# Patient Record
Sex: Female | Born: 1940
Health system: Southern US, Community
[De-identification: ages and names within clinical notes are randomized; demographics above are authoritative.]

## PROBLEM LIST (undated history)

## (undated) DIAGNOSIS — R112 Nausea with vomiting, unspecified: Secondary | ICD-10-CM

## (undated) DIAGNOSIS — Z9889 Other specified postprocedural states: Secondary | ICD-10-CM

## (undated) DIAGNOSIS — K219 Gastro-esophageal reflux disease without esophagitis: Secondary | ICD-10-CM

## (undated) DIAGNOSIS — E049 Nontoxic goiter, unspecified: Secondary | ICD-10-CM

## (undated) DIAGNOSIS — Z8669 Personal history of other diseases of the nervous system and sense organs: Secondary | ICD-10-CM

## (undated) DIAGNOSIS — J45909 Unspecified asthma, uncomplicated: Secondary | ICD-10-CM

## (undated) DIAGNOSIS — Z5189 Encounter for other specified aftercare: Secondary | ICD-10-CM

## (undated) DIAGNOSIS — I4891 Unspecified atrial fibrillation: Secondary | ICD-10-CM

## (undated) DIAGNOSIS — T4145XA Adverse effect of unspecified anesthetic, initial encounter: Secondary | ICD-10-CM

## (undated) DIAGNOSIS — C9110 Chronic lymphocytic leukemia of B-cell type not having achieved remission: Secondary | ICD-10-CM

## (undated) DIAGNOSIS — M1990 Unspecified osteoarthritis, unspecified site: Secondary | ICD-10-CM

## (undated) DIAGNOSIS — R010 Benign and innocent cardiac murmurs: Secondary | ICD-10-CM

## (undated) DIAGNOSIS — T8859XA Other complications of anesthesia, initial encounter: Secondary | ICD-10-CM

## (undated) HISTORY — DX: Chronic lymphocytic leukemia of B-cell type not having achieved remission: C91.10

## (undated) HISTORY — DX: Unspecified osteoarthritis, unspecified site: M19.90

## (undated) HISTORY — DX: Nontoxic goiter, unspecified: E04.9

## (undated) HISTORY — PX: FOOT SURGERY: SHX648

## (undated) HISTORY — DX: Encounter for other specified aftercare: Z51.89

## (undated) HISTORY — PX: TONSILLECTOMY: SUR1361

## (undated) HISTORY — DX: Gastro-esophageal reflux disease without esophagitis: K21.9

## (undated) HISTORY — DX: Personal history of other diseases of the nervous system and sense organs: Z86.69

---

## 1998-10-11 ENCOUNTER — Other Ambulatory Visit: Admission: RE | Admit: 1998-10-11 | Discharge: 1998-10-11 | Payer: Self-pay | Admitting: Internal Medicine

## 1999-04-10 ENCOUNTER — Other Ambulatory Visit: Admission: RE | Admit: 1999-04-10 | Discharge: 1999-04-10 | Payer: Self-pay | Admitting: Orthopedic Surgery

## 1999-04-13 ENCOUNTER — Ambulatory Visit (HOSPITAL_COMMUNITY): Admission: RE | Admit: 1999-04-13 | Discharge: 1999-04-13 | Payer: Self-pay | Admitting: Obstetrics & Gynecology

## 1999-09-04 ENCOUNTER — Other Ambulatory Visit: Admission: RE | Admit: 1999-09-04 | Discharge: 1999-09-04 | Payer: Self-pay | Admitting: Oncology

## 2000-06-10 ENCOUNTER — Other Ambulatory Visit: Admission: RE | Admit: 2000-06-10 | Discharge: 2000-06-10 | Payer: Self-pay | Admitting: *Deleted

## 2001-08-21 ENCOUNTER — Other Ambulatory Visit: Admission: RE | Admit: 2001-08-21 | Discharge: 2001-08-21 | Payer: Self-pay | Admitting: *Deleted

## 2001-11-14 ENCOUNTER — Encounter: Admission: RE | Admit: 2001-11-14 | Discharge: 2001-11-14 | Payer: Self-pay | Admitting: *Deleted

## 2001-11-14 ENCOUNTER — Encounter: Payer: Self-pay | Admitting: *Deleted

## 2001-11-28 ENCOUNTER — Encounter: Payer: Self-pay | Admitting: General Surgery

## 2001-11-28 ENCOUNTER — Encounter: Admission: RE | Admit: 2001-11-28 | Discharge: 2001-11-28 | Payer: Self-pay | Admitting: General Surgery

## 2001-11-29 ENCOUNTER — Encounter (INDEPENDENT_AMBULATORY_CARE_PROVIDER_SITE_OTHER): Payer: Self-pay | Admitting: Specialist

## 2001-11-29 ENCOUNTER — Ambulatory Visit (HOSPITAL_BASED_OUTPATIENT_CLINIC_OR_DEPARTMENT_OTHER): Admission: RE | Admit: 2001-11-29 | Discharge: 2001-11-29 | Payer: Self-pay | Admitting: General Surgery

## 2002-01-04 ENCOUNTER — Encounter: Admission: RE | Admit: 2002-01-04 | Discharge: 2002-01-04 | Payer: Self-pay | Admitting: Oncology

## 2002-01-04 ENCOUNTER — Encounter (INDEPENDENT_AMBULATORY_CARE_PROVIDER_SITE_OTHER): Payer: Self-pay | Admitting: Specialist

## 2002-01-04 ENCOUNTER — Encounter: Payer: Self-pay | Admitting: Oncology

## 2002-01-04 ENCOUNTER — Ambulatory Visit (HOSPITAL_COMMUNITY): Admission: RE | Admit: 2002-01-04 | Discharge: 2002-01-04 | Payer: Self-pay | Admitting: Oncology

## 2003-04-24 ENCOUNTER — Encounter: Payer: Self-pay | Admitting: Oncology

## 2003-04-24 ENCOUNTER — Encounter: Admission: RE | Admit: 2003-04-24 | Discharge: 2003-04-24 | Payer: Self-pay | Admitting: Oncology

## 2004-01-22 ENCOUNTER — Encounter: Admission: RE | Admit: 2004-01-22 | Discharge: 2004-01-22 | Payer: Self-pay | Admitting: Family Medicine

## 2004-01-31 ENCOUNTER — Encounter: Admission: RE | Admit: 2004-01-31 | Discharge: 2004-01-31 | Payer: Self-pay | Admitting: Oncology

## 2004-05-22 ENCOUNTER — Ambulatory Visit (HOSPITAL_COMMUNITY): Admission: RE | Admit: 2004-05-22 | Discharge: 2004-05-22 | Payer: Self-pay | Admitting: Oncology

## 2004-11-03 ENCOUNTER — Ambulatory Visit: Payer: Self-pay | Admitting: Oncology

## 2004-11-12 ENCOUNTER — Other Ambulatory Visit: Admission: RE | Admit: 2004-11-12 | Discharge: 2004-11-12 | Payer: Self-pay | Admitting: Family Medicine

## 2005-02-02 ENCOUNTER — Ambulatory Visit: Payer: Self-pay | Admitting: Oncology

## 2005-03-29 ENCOUNTER — Ambulatory Visit: Payer: Self-pay | Admitting: Oncology

## 2005-06-28 ENCOUNTER — Ambulatory Visit: Payer: Self-pay | Admitting: Oncology

## 2005-09-28 ENCOUNTER — Ambulatory Visit: Payer: Self-pay | Admitting: Oncology

## 2005-11-15 ENCOUNTER — Other Ambulatory Visit: Admission: RE | Admit: 2005-11-15 | Discharge: 2005-11-15 | Payer: Self-pay | Admitting: Family Medicine

## 2006-01-25 ENCOUNTER — Ambulatory Visit: Payer: Self-pay | Admitting: Oncology

## 2006-05-19 ENCOUNTER — Ambulatory Visit: Payer: Self-pay | Admitting: Oncology

## 2006-05-25 LAB — COMPREHENSIVE METABOLIC PANEL WITH GFR
ALT: 18 U/L (ref 0–40)
AST: 41 U/L — ABNORMAL HIGH (ref 0–37)
Albumin: 4.3 g/dL (ref 3.5–5.2)
Alkaline Phosphatase: 64 U/L (ref 39–117)
BUN: 10 mg/dL (ref 6–23)
CO2: 26 meq/L (ref 19–32)
Calcium: 9.4 mg/dL (ref 8.4–10.5)
Chloride: 104 meq/L (ref 96–112)
Creatinine, Ser: 0.9 mg/dL (ref 0.4–1.2)
Glucose, Bld: 89 mg/dL (ref 70–99)
Potassium: 4.1 meq/L (ref 3.5–5.3)
Sodium: 139 meq/L (ref 135–145)
Total Bilirubin: 0.6 mg/dL (ref 0.3–1.2)
Total Protein: 6.5 g/dL (ref 6.0–8.3)

## 2006-05-25 LAB — CBC WITH DIFFERENTIAL/PLATELET
BASO%: 2 % (ref 0.0–2.0)
Basophils Absolute: 0 10*3/uL (ref 0.0–0.1)
EOS%: 4.1 % (ref 0.0–7.0)
HGB: 14.2 g/dL (ref 11.6–15.9)
MCH: 31.5 pg (ref 26.0–34.0)
RDW: 14.3 % (ref 11.3–14.5)
WBC: 2.3 10*3/uL — ABNORMAL LOW (ref 3.9–10.0)
lymph#: 0.9 10*3/uL (ref 0.9–3.3)

## 2006-05-25 LAB — LACTATE DEHYDROGENASE: LDH: 223 U/L (ref 94–250)

## 2006-09-19 ENCOUNTER — Ambulatory Visit: Payer: Self-pay | Admitting: Oncology

## 2006-09-21 LAB — CBC WITH DIFFERENTIAL/PLATELET
Basophils Absolute: 0 10*3/uL (ref 0.0–0.1)
Eosinophils Absolute: 0.1 10*3/uL (ref 0.0–0.5)
HCT: 40.7 % (ref 34.8–46.6)
HGB: 13.8 g/dL (ref 11.6–15.9)
LYMPH%: 46 % (ref 14.0–48.0)
MCV: 93.4 fL (ref 81.0–101.0)
MONO#: 0.3 10*3/uL (ref 0.1–0.9)
MONO%: 10.4 % (ref 0.0–13.0)
NEUT#: 1 10*3/uL — ABNORMAL LOW (ref 1.5–6.5)
Platelets: 191 10*3/uL (ref 145–400)
RBC: 4.35 10*6/uL (ref 3.70–5.32)
WBC: 2.6 10*3/uL — ABNORMAL LOW (ref 3.9–10.0)

## 2006-09-21 LAB — LACTATE DEHYDROGENASE: LDH: 196 U/L (ref 94–250)

## 2006-09-21 LAB — COMPREHENSIVE METABOLIC PANEL
ALT: 16 U/L (ref 0–40)
BUN: 9 mg/dL (ref 6–23)
CO2: 27 mEq/L (ref 19–32)
Calcium: 9.7 mg/dL (ref 8.4–10.5)
Chloride: 106 mEq/L (ref 96–112)
Creatinine, Ser: 0.83 mg/dL (ref 0.40–1.20)
Glucose, Bld: 87 mg/dL (ref 70–99)

## 2006-09-21 LAB — MORPHOLOGY: RBC Comments: NORMAL

## 2006-10-31 ENCOUNTER — Ambulatory Visit (HOSPITAL_COMMUNITY): Admission: RE | Admit: 2006-10-31 | Discharge: 2006-10-31 | Payer: Self-pay | Admitting: Oncology

## 2006-11-07 ENCOUNTER — Ambulatory Visit: Payer: Self-pay | Admitting: Oncology

## 2006-11-09 LAB — CBC WITH DIFFERENTIAL/PLATELET
Basophils Absolute: 0.1 10*3/uL (ref 0.0–0.1)
EOS%: 3.5 % (ref 0.0–7.0)
Eosinophils Absolute: 0.1 10*3/uL (ref 0.0–0.5)
HCT: 41.1 % (ref 34.8–46.6)
HGB: 13.7 g/dL (ref 11.6–15.9)
MCH: 31.2 pg (ref 26.0–34.0)
MONO#: 0.5 10*3/uL (ref 0.1–0.9)
NEUT#: 1.1 10*3/uL — ABNORMAL LOW (ref 1.5–6.5)
NEUT%: 38.7 % — ABNORMAL LOW (ref 39.6–76.8)
RDW: 12.7 % (ref 11.3–14.5)
WBC: 3 10*3/uL — ABNORMAL LOW (ref 3.9–10.0)
lymph#: 1.2 10*3/uL (ref 0.9–3.3)

## 2006-12-28 ENCOUNTER — Ambulatory Visit: Payer: Self-pay | Admitting: Oncology

## 2007-01-02 LAB — CBC WITH DIFFERENTIAL/PLATELET
EOS%: 4.7 % (ref 0.0–7.0)
MCH: 31.2 pg (ref 26.0–34.0)
MCV: 91.5 fL (ref 81.0–101.0)
MONO%: 14.7 % — ABNORMAL HIGH (ref 0.0–13.0)
RBC: 4.56 10*6/uL (ref 3.70–5.32)
RDW: 12.6 % (ref 11.3–14.5)

## 2007-01-27 ENCOUNTER — Encounter: Admission: RE | Admit: 2007-01-27 | Discharge: 2007-01-27 | Payer: Self-pay | Admitting: Family Medicine

## 2007-03-06 ENCOUNTER — Ambulatory Visit: Payer: Self-pay | Admitting: Oncology

## 2007-03-08 LAB — CBC WITH DIFFERENTIAL/PLATELET
Basophils Absolute: 0 10*3/uL (ref 0.0–0.1)
Eosinophils Absolute: 0.1 10*3/uL (ref 0.0–0.5)
HCT: 42.2 % (ref 34.8–46.6)
HGB: 14.4 g/dL (ref 11.6–15.9)
LYMPH%: 50.4 % — ABNORMAL HIGH (ref 14.0–48.0)
MCV: 91.5 fL (ref 81.0–101.0)
MONO#: 0.3 10*3/uL (ref 0.1–0.9)
MONO%: 13.4 % — ABNORMAL HIGH (ref 0.0–13.0)
NEUT#: 0.8 10*3/uL — ABNORMAL LOW (ref 1.5–6.5)
Platelets: 174 10*3/uL (ref 145–400)

## 2007-03-08 LAB — COMPREHENSIVE METABOLIC PANEL
Albumin: 4.2 g/dL (ref 3.5–5.2)
Alkaline Phosphatase: 72 U/L (ref 39–117)
BUN: 13 mg/dL (ref 6–23)
CO2: 28 mEq/L (ref 19–32)
Glucose, Bld: 87 mg/dL (ref 70–99)
Total Bilirubin: 0.6 mg/dL (ref 0.3–1.2)

## 2007-03-08 LAB — LACTATE DEHYDROGENASE: LDH: 212 U/L (ref 94–250)

## 2007-03-29 LAB — CBC WITH DIFFERENTIAL/PLATELET
BASO%: 2.8 % — ABNORMAL HIGH (ref 0.0–2.0)
Eosinophils Absolute: 0.1 10*3/uL (ref 0.0–0.5)
HCT: 41 % (ref 34.8–46.6)
LYMPH%: 41.3 % (ref 14.0–48.0)
MONO#: 0.4 10*3/uL (ref 0.1–0.9)
NEUT#: 1.2 10*3/uL — ABNORMAL LOW (ref 1.5–6.5)
Platelets: 206 10*3/uL (ref 145–400)
RBC: 4.48 10*6/uL (ref 3.70–5.32)
WBC: 3.2 10*3/uL — ABNORMAL LOW (ref 3.9–10.0)
lymph#: 1.3 10*3/uL (ref 0.9–3.3)

## 2007-06-01 ENCOUNTER — Ambulatory Visit: Payer: Self-pay | Admitting: Oncology

## 2007-06-05 LAB — LACTATE DEHYDROGENASE: LDH: 190 U/L (ref 94–250)

## 2007-06-05 LAB — CBC WITH DIFFERENTIAL/PLATELET
Basophils Absolute: 0 10*3/uL (ref 0.0–0.1)
Eosinophils Absolute: 0.1 10*3/uL (ref 0.0–0.5)
HCT: 39.5 % (ref 34.8–46.6)
HGB: 13.7 g/dL (ref 11.6–15.9)
LYMPH%: 40.5 % (ref 14.0–48.0)
MCHC: 34.7 g/dL (ref 32.0–36.0)
MONO#: 0.3 10*3/uL (ref 0.1–0.9)
NEUT%: 40.1 % (ref 39.6–76.8)
Platelets: 166 10*3/uL (ref 145–400)
WBC: 2.1 10*3/uL — ABNORMAL LOW (ref 3.9–10.0)
lymph#: 0.9 10*3/uL (ref 0.9–3.3)

## 2007-06-05 LAB — COMPREHENSIVE METABOLIC PANEL
ALT: 19 U/L (ref 0–35)
AST: 34 U/L (ref 0–37)
CO2: 26 mEq/L (ref 19–32)
Chloride: 108 mEq/L (ref 96–112)
Creatinine, Ser: 0.85 mg/dL (ref 0.40–1.20)
Sodium: 144 mEq/L (ref 135–145)
Total Bilirubin: 0.6 mg/dL (ref 0.3–1.2)
Total Protein: 6.3 g/dL (ref 6.0–8.3)

## 2007-07-12 ENCOUNTER — Ambulatory Visit: Payer: Self-pay | Admitting: Oncology

## 2007-07-12 LAB — CBC WITH DIFFERENTIAL/PLATELET
BASO%: 3.6 % — ABNORMAL HIGH (ref 0.0–2.0)
Basophils Absolute: 0.1 10*3/uL (ref 0.0–0.1)
EOS%: 4 % (ref 0.0–7.0)
HCT: 43.4 % (ref 34.8–46.6)
HGB: 14.8 g/dL (ref 11.6–15.9)
LYMPH%: 45.2 % (ref 14.0–48.0)
MCH: 31.6 pg (ref 26.0–34.0)
MCHC: 34 g/dL (ref 32.0–36.0)
MCV: 93 fL (ref 81.0–101.0)
MONO%: 15.6 % — ABNORMAL HIGH (ref 0.0–13.0)
NEUT%: 31.6 % — ABNORMAL LOW (ref 39.6–76.8)
lymph#: 1.2 10*3/uL (ref 0.9–3.3)

## 2007-10-05 ENCOUNTER — Ambulatory Visit: Payer: Self-pay | Admitting: Oncology

## 2007-10-09 LAB — COMPREHENSIVE METABOLIC PANEL
ALT: 19 U/L (ref 0–35)
AST: 37 U/L (ref 0–37)
BUN: 11 mg/dL (ref 6–23)
Calcium: 9.7 mg/dL (ref 8.4–10.5)
Chloride: 104 mEq/L (ref 96–112)
Creatinine, Ser: 0.88 mg/dL (ref 0.40–1.20)
Total Bilirubin: 0.6 mg/dL (ref 0.3–1.2)

## 2007-10-09 LAB — CBC WITH DIFFERENTIAL/PLATELET
BASO%: 2.8 % — ABNORMAL HIGH (ref 0.0–2.0)
Basophils Absolute: 0.1 10*3/uL (ref 0.0–0.1)
EOS%: 5.2 % (ref 0.0–7.0)
HGB: 14 g/dL (ref 11.6–15.9)
MCH: 32.1 pg (ref 26.0–34.0)
MCHC: 34.9 g/dL (ref 32.0–36.0)
MCV: 92 fL (ref 81.0–101.0)
MONO%: 16.3 % — ABNORMAL HIGH (ref 0.0–13.0)
RBC: 4.36 10*6/uL (ref 3.70–5.32)
RDW: 11.9 % (ref 11.3–14.5)
lymph#: 1 10*3/uL (ref 0.9–3.3)

## 2007-10-09 LAB — MORPHOLOGY: PLT EST: ADEQUATE

## 2008-01-10 ENCOUNTER — Ambulatory Visit: Payer: Self-pay | Admitting: Oncology

## 2008-01-12 LAB — CBC WITH DIFFERENTIAL/PLATELET
BASO%: 1.1 % (ref 0.0–2.0)
LYMPH%: 39 % (ref 14.0–48.0)
MCHC: 34.1 g/dL (ref 32.0–36.0)
MCV: 91.6 fL (ref 81.0–101.0)
MONO#: 0.4 10*3/uL (ref 0.1–0.9)
MONO%: 13 % (ref 0.0–13.0)
Platelets: 191 10*3/uL (ref 145–400)
RBC: 4.53 10*6/uL (ref 3.70–5.32)
RDW: 14.4 % (ref 11.3–14.5)
WBC: 3.1 10*3/uL — ABNORMAL LOW (ref 3.9–10.0)

## 2008-01-12 LAB — COMPREHENSIVE METABOLIC PANEL
ALT: 23 U/L (ref 0–35)
AST: 42 U/L — ABNORMAL HIGH (ref 0–37)
Alkaline Phosphatase: 70 U/L (ref 39–117)
Sodium: 142 mEq/L (ref 135–145)
Total Bilirubin: 0.6 mg/dL (ref 0.3–1.2)
Total Protein: 6.7 g/dL (ref 6.0–8.3)

## 2008-02-12 ENCOUNTER — Other Ambulatory Visit: Admission: RE | Admit: 2008-02-12 | Discharge: 2008-02-12 | Payer: Self-pay | Admitting: Family Medicine

## 2008-05-06 ENCOUNTER — Ambulatory Visit: Payer: Self-pay | Admitting: Oncology

## 2008-08-02 ENCOUNTER — Ambulatory Visit: Payer: Self-pay | Admitting: Oncology

## 2008-11-25 ENCOUNTER — Ambulatory Visit: Payer: Self-pay | Admitting: Oncology

## 2008-11-27 LAB — COMPREHENSIVE METABOLIC PANEL
AST: 38 U/L — ABNORMAL HIGH (ref 0–37)
Albumin: 4.4 g/dL (ref 3.5–5.2)
Alkaline Phosphatase: 77 U/L (ref 39–117)
Calcium: 9.8 mg/dL (ref 8.4–10.5)
Chloride: 105 mEq/L (ref 96–112)
Potassium: 4.1 mEq/L (ref 3.5–5.3)
Sodium: 142 mEq/L (ref 135–145)
Total Protein: 6.6 g/dL (ref 6.0–8.3)

## 2008-11-27 LAB — CBC WITH DIFFERENTIAL/PLATELET
EOS%: 3 % (ref 0.0–7.0)
Eosinophils Absolute: 0.1 10*3/uL (ref 0.0–0.5)
HGB: 14.5 g/dL (ref 11.6–15.9)
MCH: 31.6 pg (ref 26.0–34.0)
MCV: 93.8 fL (ref 81.0–101.0)
MONO%: 9.9 % (ref 0.0–13.0)
NEUT#: 1.3 10*3/uL — ABNORMAL LOW (ref 1.5–6.5)
RBC: 4.59 10*6/uL (ref 3.70–5.32)
RDW: 15.1 % — ABNORMAL HIGH (ref 11.3–14.5)
lymph#: 1.1 10*3/uL (ref 0.9–3.3)

## 2008-11-27 LAB — LACTATE DEHYDROGENASE: LDH: 209 U/L (ref 94–250)

## 2008-12-23 ENCOUNTER — Ambulatory Visit (HOSPITAL_COMMUNITY): Admission: RE | Admit: 2008-12-23 | Discharge: 2008-12-23 | Payer: Self-pay | Admitting: Oncology

## 2009-01-13 ENCOUNTER — Ambulatory Visit: Payer: Self-pay | Admitting: Oncology

## 2009-02-18 ENCOUNTER — Encounter: Admission: RE | Admit: 2009-02-18 | Discharge: 2009-02-18 | Payer: Self-pay | Admitting: Family Medicine

## 2009-05-12 ENCOUNTER — Ambulatory Visit: Payer: Self-pay | Admitting: Oncology

## 2009-05-14 LAB — CBC WITH DIFFERENTIAL/PLATELET
Basophils Absolute: 0 10*3/uL (ref 0.0–0.1)
EOS%: 5.2 % (ref 0.0–7.0)
Eosinophils Absolute: 0.2 10*3/uL (ref 0.0–0.5)
HCT: 41.4 % (ref 34.8–46.6)
HGB: 14 g/dL (ref 11.6–15.9)
LYMPH%: 40 % (ref 14.0–49.7)
MCH: 31.1 pg (ref 25.1–34.0)
MCV: 92.2 fL (ref 79.5–101.0)
MONO%: 15.3 % — ABNORMAL HIGH (ref 0.0–14.0)
NEUT#: 1.3 10*3/uL — ABNORMAL LOW (ref 1.5–6.5)
NEUT%: 38.4 % (ref 38.4–76.8)
Platelets: 186 10*3/uL (ref 145–400)
RDW: 14.9 % — ABNORMAL HIGH (ref 11.2–14.5)

## 2009-05-14 LAB — LACTATE DEHYDROGENASE: LDH: 215 U/L (ref 94–250)

## 2009-05-14 LAB — COMPREHENSIVE METABOLIC PANEL
AST: 34 U/L (ref 0–37)
Albumin: 4.1 g/dL (ref 3.5–5.2)
BUN: 17 mg/dL (ref 6–23)
CO2: 27 mEq/L (ref 19–32)
Calcium: 9.4 mg/dL (ref 8.4–10.5)
Chloride: 106 mEq/L (ref 96–112)
Creatinine, Ser: 0.91 mg/dL (ref 0.40–1.20)
Potassium: 3.9 mEq/L (ref 3.5–5.3)

## 2009-05-14 LAB — MORPHOLOGY

## 2009-05-28 ENCOUNTER — Ambulatory Visit (HOSPITAL_COMMUNITY): Admission: RE | Admit: 2009-05-28 | Discharge: 2009-05-28 | Payer: Self-pay | Admitting: Oncology

## 2009-06-02 ENCOUNTER — Ambulatory Visit: Admission: RE | Admit: 2009-06-02 | Discharge: 2009-07-29 | Payer: Self-pay | Admitting: Radiation Oncology

## 2009-07-10 ENCOUNTER — Ambulatory Visit: Payer: Self-pay | Admitting: Oncology

## 2009-07-14 LAB — CBC WITH DIFFERENTIAL/PLATELET
Eosinophils Absolute: 0.1 10*3/uL (ref 0.0–0.5)
HCT: 41.5 % (ref 34.8–46.6)
LYMPH%: 56.4 % — ABNORMAL HIGH (ref 14.0–49.7)
MONO#: 0.3 10*3/uL (ref 0.1–0.9)
NEUT#: 0.9 10*3/uL — ABNORMAL LOW (ref 1.5–6.5)
Platelets: 138 10*3/uL — ABNORMAL LOW (ref 145–400)
RBC: 4.58 10*6/uL (ref 3.70–5.45)
WBC: 3.1 10*3/uL — ABNORMAL LOW (ref 3.9–10.3)

## 2009-11-10 ENCOUNTER — Ambulatory Visit: Payer: Self-pay | Admitting: Oncology

## 2009-11-12 LAB — CBC WITH DIFFERENTIAL/PLATELET
BASO%: 0.9 % (ref 0.0–2.0)
EOS%: 3.2 % (ref 0.0–7.0)
LYMPH%: 46.4 % (ref 14.0–49.7)
MCHC: 33.4 g/dL (ref 31.5–36.0)
MCV: 95.5 fL (ref 79.5–101.0)
MONO%: 8.1 % (ref 0.0–14.0)
Platelets: 156 10*3/uL (ref 145–400)
RBC: 4.32 10*6/uL (ref 3.70–5.45)

## 2009-11-12 LAB — COMPREHENSIVE METABOLIC PANEL
Albumin: 4.2 g/dL (ref 3.5–5.2)
Alkaline Phosphatase: 67 U/L (ref 39–117)
BUN: 15 mg/dL (ref 6–23)
Creatinine, Ser: 0.95 mg/dL (ref 0.40–1.20)
Glucose, Bld: 93 mg/dL (ref 70–99)
Total Bilirubin: 0.4 mg/dL (ref 0.3–1.2)

## 2009-11-12 LAB — MORPHOLOGY: RBC Comments: NORMAL

## 2010-05-12 ENCOUNTER — Ambulatory Visit: Payer: Self-pay | Admitting: Oncology

## 2010-05-13 LAB — CBC WITH DIFFERENTIAL/PLATELET
Eosinophils Absolute: 0.1 10*3/uL (ref 0.0–0.5)
HCT: 42.9 % (ref 34.8–46.6)
LYMPH%: 49.5 % (ref 14.0–49.7)
MCV: 93 fL (ref 79.5–101.0)
MONO#: 0.3 10*3/uL (ref 0.1–0.9)
MONO%: 10.2 % (ref 0.0–14.0)
NEUT#: 1.1 10*3/uL — ABNORMAL LOW (ref 1.5–6.5)
NEUT%: 36.1 % — ABNORMAL LOW (ref 38.4–76.8)
Platelets: 150 10*3/uL (ref 145–400)
RBC: 4.61 10*6/uL (ref 3.70–5.45)

## 2010-05-13 LAB — MORPHOLOGY: RBC Comments: NORMAL

## 2010-11-11 ENCOUNTER — Ambulatory Visit: Payer: Self-pay | Admitting: Oncology

## 2010-11-13 LAB — CBC WITH DIFFERENTIAL/PLATELET
Basophils Absolute: 0 10*3/uL (ref 0.0–0.1)
Eosinophils Absolute: 0.1 10*3/uL (ref 0.0–0.5)
HCT: 39.8 % (ref 34.8–46.6)
HGB: 13.7 g/dL (ref 11.6–15.9)
LYMPH%: 57 % — ABNORMAL HIGH (ref 14.0–49.7)
MCV: 93 fL (ref 79.5–101.0)
MONO#: 0.3 10*3/uL (ref 0.1–0.9)
MONO%: 10.7 % (ref 0.0–14.0)
NEUT#: 0.9 10*3/uL — ABNORMAL LOW (ref 1.5–6.5)
NEUT%: 28.7 % — ABNORMAL LOW (ref 38.4–76.8)
Platelets: 201 10*3/uL (ref 145–400)
RBC: 4.28 10*6/uL (ref 3.70–5.45)
WBC: 3 10*3/uL — ABNORMAL LOW (ref 3.9–10.3)

## 2010-11-13 LAB — COMPREHENSIVE METABOLIC PANEL
Alkaline Phosphatase: 77 U/L (ref 39–117)
BUN: 13 mg/dL (ref 6–23)
CO2: 31 mEq/L (ref 19–32)
Creatinine, Ser: 0.87 mg/dL (ref 0.40–1.20)
Glucose, Bld: 93 mg/dL (ref 70–99)
Sodium: 140 mEq/L (ref 135–145)
Total Bilirubin: 0.5 mg/dL (ref 0.3–1.2)
Total Protein: 6.5 g/dL (ref 6.0–8.3)

## 2010-11-13 LAB — LACTATE DEHYDROGENASE: LDH: 194 U/L (ref 94–250)

## 2011-05-14 ENCOUNTER — Encounter (HOSPITAL_BASED_OUTPATIENT_CLINIC_OR_DEPARTMENT_OTHER): Payer: Medicare Other | Admitting: Oncology

## 2011-05-14 ENCOUNTER — Other Ambulatory Visit: Payer: Self-pay | Admitting: Oncology

## 2011-05-14 DIAGNOSIS — C9110 Chronic lymphocytic leukemia of B-cell type not having achieved remission: Secondary | ICD-10-CM

## 2011-05-14 LAB — CBC WITH DIFFERENTIAL/PLATELET
BASO%: 1.2 % (ref 0.0–2.0)
Eosinophils Absolute: 0.1 10*3/uL (ref 0.0–0.5)
HCT: 41.2 % (ref 34.8–46.6)
MCHC: 33.5 g/dL (ref 31.5–36.0)
MONO#: 0.3 10*3/uL (ref 0.1–0.9)
NEUT#: 0.9 10*3/uL — ABNORMAL LOW (ref 1.5–6.5)
NEUT%: 26.8 % — ABNORMAL LOW (ref 38.4–76.8)
Platelets: 167 10*3/uL (ref 145–400)
WBC: 3.4 10*3/uL — ABNORMAL LOW (ref 3.9–10.3)
lymph#: 2 10*3/uL (ref 0.9–3.3)

## 2011-05-14 NOTE — Op Note (Signed)
[place]  Patient:    Brenda Terrell, Brenda Terrell Visit Number: [id number] MRN: [id number]          Service Type: DSU Location: [facility] Attending Physician:  Caleen Essex Dictated by:   Ollen Gross. Vernell Morgans, M.D. Proc. Date: 11/29/01 Admit Date:  11/29/2001 Discharge Date: 11/29/2001                             Operative Report  PREOPERATIVE DIAGNOSIS:  Left neck lymphadenopathy.  POSTOPERATIVE DIAGNOSIS:  Left neck lymphadenopathy.  PROCEDURE:  Excisional biopsy of left neck lymph node.  SURGEON:  Ollen Gross. Vernell Morgans, M.D.  ANESTHESIA:  General via LMA.  DESCRIPTION OF PROCEDURE:  After informed consent was obtained, the patient was brought to the operating room and placed in a supine position on the operating table.  After adequate induction of general anesthesia, the patients left neck was prepped with Betadine and draped in the usual sterile manner.  A small longitudinal incision was made over the most superficial palpable mobile lymph node.  This incision was carried down through the skin and subcutaneous tissue using the Bovie electrocautery.  Blunt dissection was then carried out to separate the lymph node from the rest of the surrounding tissues.  Once the lymph node was elevated, the lymphatic tracts leading into the lymph node inferiorly were clamped serially with hemostats and the lymph node was removed with the Metzenbaum scissors.  Each of these tracts that were clamped with hemostats were then ligated with 3-0 Vicryl ties.  The wound was then examined and found to be hemostatic.  The incision was then closed with a running 4-0 Monocryl subcuticular stitch.  Benzoin and Steri-Strips and a sterile dressing were applied.  The patient tolerated the procedure well.  At the end of the case, all needle, sponge, and instrument counts were correct. The patient was awakened and taken to the recovery room in stable condition. Dictated by:   Ollen Gross. Vernell Morgans, M.D. Attending Physician:  Caleen Essex DD:  11/30/01 TD:  11/30/01 Job: [id number]

## 2011-05-17 LAB — COMPREHENSIVE METABOLIC PANEL
Albumin: 4.1 g/dL (ref 3.5–5.2)
Alkaline Phosphatase: 62 U/L (ref 39–117)
BUN: 19 mg/dL (ref 6–23)
CO2: 24 mEq/L (ref 19–32)
Glucose, Bld: 91 mg/dL (ref 70–99)
Potassium: 3.8 mEq/L (ref 3.5–5.3)
Total Bilirubin: 0.6 mg/dL (ref 0.3–1.2)

## 2011-05-17 LAB — ANA: Anti Nuclear Antibody(ANA): NEGATIVE

## 2011-05-17 LAB — RHEUMATOID FACTOR: Rhuematoid fact SerPl-aCnc: <10 IU/mL (ref <=14)

## 2011-05-17 LAB — LACTATE DEHYDROGENASE: LDH: 198 U/L (ref 94–250)

## 2011-10-27 ENCOUNTER — Encounter (HOSPITAL_BASED_OUTPATIENT_CLINIC_OR_DEPARTMENT_OTHER): Payer: Medicare Other | Admitting: Oncology

## 2011-10-27 ENCOUNTER — Other Ambulatory Visit: Payer: Self-pay | Admitting: Oncology

## 2011-10-27 DIAGNOSIS — B029 Zoster without complications: Secondary | ICD-10-CM

## 2011-10-27 DIAGNOSIS — C9110 Chronic lymphocytic leukemia of B-cell type not having achieved remission: Secondary | ICD-10-CM

## 2011-10-27 LAB — COMPREHENSIVE METABOLIC PANEL
ALT: 24 U/L (ref 0–35)
Albumin: 4.2 g/dL (ref 3.5–5.2)
CO2: 24 mEq/L (ref 19–32)
Glucose, Bld: 98 mg/dL (ref 70–99)
Potassium: 4.1 mEq/L (ref 3.5–5.3)
Sodium: 141 mEq/L (ref 135–145)
Total Protein: 6.2 g/dL (ref 6.0–8.3)

## 2011-10-27 LAB — CBC WITH DIFFERENTIAL/PLATELET
Eosinophils Absolute: 0.1 10*3/uL (ref 0.0–0.5)
MONO#: 0.3 10*3/uL (ref 0.1–0.9)
NEUT#: 1.1 10*3/uL — ABNORMAL LOW (ref 1.5–6.5)
Platelets: 149 10*3/uL (ref 145–400)
RBC: 4.33 10*6/uL (ref 3.70–5.45)
RDW: 15.3 % — ABNORMAL HIGH (ref 11.2–14.5)
WBC: 3.7 10*3/uL — ABNORMAL LOW (ref 3.9–10.3)
lymph#: 2.1 10*3/uL (ref 0.9–3.3)

## 2011-10-27 LAB — LACTATE DEHYDROGENASE: LDH: 203 U/L (ref 94–250)

## 2011-12-16 ENCOUNTER — Encounter: Payer: Self-pay | Admitting: Oncology

## 2012-02-18 DIAGNOSIS — Z23 Encounter for immunization: Secondary | ICD-10-CM | POA: Diagnosis not present

## 2012-02-18 DIAGNOSIS — J45909 Unspecified asthma, uncomplicated: Secondary | ICD-10-CM | POA: Diagnosis not present

## 2012-02-18 DIAGNOSIS — E039 Hypothyroidism, unspecified: Secondary | ICD-10-CM | POA: Diagnosis not present

## 2012-02-18 DIAGNOSIS — E7800 Pure hypercholesterolemia, unspecified: Secondary | ICD-10-CM | POA: Diagnosis not present

## 2012-05-24 DIAGNOSIS — H4011X0 Primary open-angle glaucoma, stage unspecified: Secondary | ICD-10-CM | POA: Diagnosis not present

## 2012-05-24 DIAGNOSIS — H43819 Vitreous degeneration, unspecified eye: Secondary | ICD-10-CM | POA: Diagnosis not present

## 2012-05-24 DIAGNOSIS — H04129 Dry eye syndrome of unspecified lacrimal gland: Secondary | ICD-10-CM | POA: Diagnosis not present

## 2012-06-27 DIAGNOSIS — H04129 Dry eye syndrome of unspecified lacrimal gland: Secondary | ICD-10-CM | POA: Diagnosis not present

## 2012-06-27 DIAGNOSIS — H4011X0 Primary open-angle glaucoma, stage unspecified: Secondary | ICD-10-CM | POA: Diagnosis not present

## 2012-06-27 DIAGNOSIS — H43819 Vitreous degeneration, unspecified eye: Secondary | ICD-10-CM | POA: Diagnosis not present

## 2012-07-05 ENCOUNTER — Ambulatory Visit (HOSPITAL_BASED_OUTPATIENT_CLINIC_OR_DEPARTMENT_OTHER): Payer: Medicare Other | Admitting: Oncology

## 2012-07-05 ENCOUNTER — Other Ambulatory Visit (HOSPITAL_BASED_OUTPATIENT_CLINIC_OR_DEPARTMENT_OTHER): Payer: Medicare Other | Admitting: Lab

## 2012-07-05 ENCOUNTER — Encounter: Payer: Self-pay | Admitting: Oncology

## 2012-07-05 VITALS — BP 142/78 | HR 77 | Temp 97.8°F | Ht 60.0 in | Wt 134.4 lb

## 2012-07-05 DIAGNOSIS — C9110 Chronic lymphocytic leukemia of B-cell type not having achieved remission: Secondary | ICD-10-CM

## 2012-07-05 DIAGNOSIS — C8599 Non-Hodgkin lymphoma, unspecified, extranodal and solid organ sites: Secondary | ICD-10-CM | POA: Diagnosis not present

## 2012-07-05 DIAGNOSIS — D72819 Decreased white blood cell count, unspecified: Secondary | ICD-10-CM | POA: Insufficient documentation

## 2012-07-05 DIAGNOSIS — D863 Sarcoidosis of skin: Secondary | ICD-10-CM

## 2012-07-05 DIAGNOSIS — C8300 Small cell B-cell lymphoma, unspecified site: Secondary | ICD-10-CM

## 2012-07-05 LAB — COMPREHENSIVE METABOLIC PANEL
ALT: 15 U/L (ref 0–35)
Albumin: 4 g/dL (ref 3.5–5.2)
CO2: 29 mEq/L (ref 19–32)
Calcium: 9.5 mg/dL (ref 8.4–10.5)
Chloride: 105 mEq/L (ref 96–112)
Sodium: 141 mEq/L (ref 135–145)
Total Protein: 6 g/dL (ref 6.0–8.3)

## 2012-07-05 LAB — LACTATE DEHYDROGENASE: LDH: 180 U/L (ref 94–250)

## 2012-07-05 LAB — CBC WITH DIFFERENTIAL/PLATELET
Basophils Absolute: 0.1 10*3/uL (ref 0.0–0.1)
HCT: 41.4 % (ref 34.8–46.6)
HGB: 13.9 g/dL (ref 11.6–15.9)
MCH: 31.7 pg (ref 25.1–34.0)
MONO#: 0.3 10*3/uL (ref 0.1–0.9)
NEUT%: 27 % — ABNORMAL LOW (ref 38.4–76.8)
Platelets: 153 10*3/uL (ref 145–400)
WBC: 3.7 10*3/uL — ABNORMAL LOW (ref 3.9–10.3)
lymph#: 2.2 10*3/uL (ref 0.9–3.3)

## 2012-07-05 LAB — MORPHOLOGY: PLT EST: ADEQUATE

## 2012-07-05 NOTE — Patient Instructions (Signed)
Prescription for Famvir, to begin promptly if symptoms of shingles; call MD if need to begin

## 2012-07-05 NOTE — Progress Notes (Signed)
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 She has not required treatment for the CLL since we tried Rituxan in 2005. She was seen several years ago by Dr Delene Ruffini at [facility].  Patient has had no infectious illnesses since she was here last in Oct 2012. She did have several months of muscle aches arms and thighs of unclear etiology, these recently resolved, which she thinks is related to a new herbal supplement. 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 She has residual scarring from zoster across left scapula and some discomfort there. Bowels and bladder unchanged. She has follow up of cholesterol with Dr Maurice Small in August. Last mammograms were at [facility] 12-16-11. Remainder of 10 point Review of Systems negative.  Objective:  Vital signs in last 24 hours:  BP 142/78  Pulse 77  Temp 97.8 F (36.6 C) (Oral)  Ht 5' (1.524 m)  Wt 134 lb 6.4 oz (60.963 kg)  BMI 26.25 kg/m2 Weight is stable. Easily ambulatory, looks comfortable.  HEENT:PERRLA, sclera clear, anicteric and oropharynx clear, no lesions LymphaticsCervical, supraclavicular, and axillary nodes normal. Resp: clear to auscultation bilaterally and normal percussion bilaterally Cardio: regular rate and rhythm GI: soft, non-tender; bowel sounds normal; no masses,  no organomegaly Extremities: extremities normal, atraumatic, no cyanosis or edema Neuro: nonfocal Breasts bilaterally without dominant mass, skin or nipple findings. Axillae benign Skin with discoloration in area of previous zoster left upper back.  Lab Results:  Results for orders placed in visit on 07/05/12  CBC WITH DIFFERENTIAL      Component Value Range   WBC 3.7 (*) 3.9 - 10.3  10e3/uL   NEUT# 1.0 (*) 1.5 - 6.5 10e3/uL   HGB 13.9  11.6 - 15.9 g/dL   HCT 52.8  41.3 - 24.4 %   Platelets 153  145 - 400 10e3/uL   MCV 94.7  79.5 - 101.0 fL   MCH 31.7  25.1 - 34.0 pg   MCHC 33.5  31.5 - 36.0 g/dL   RBC 0.10  2.72 - 5.36 10e6/uL   RDW 14.9 (*) 11.2 - 14.5 %   lymph# 2.2  0.9 - 3.3 10e3/uL   MONO# 0.3  0.1 - 0.9 10e3/uL   Eosinophils Absolute 0.2  0.0 - 0.5 10e3/uL   Basophils Absolute 0.1  0.0 - 0.1 10e3/uL   NEUT% 27.0 (*) 38.4 - 76.8 %   LYMPH% 58.5 (*) 14.0 - 49.7 %   MONO% 8.8  0.0 - 14.0 %   EOS% 4.2  0.0 - 7.0 %   BASO% 1.5  0.0 - 2.0 %  MORPHOLOGY      Component Value Range   RBC Comments Within Normal Limits  Within Normal Limits   White Cell Comments Variant Lymphs     PLT EST Adequate  Adequate    CMET available after visit normal, including normal AST. Studies/Results:  No results found.  Medications: I have reviewed the patient's current medications. I have given her a prescription for Famvir 500 mg tid to begin promptly if rash or symptoms suggest recurrent zoster; she is to call MD also if such symptoms.  Assessment/Plan: 1. 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 I will see her again ~  6 months from visit with PCP in August,  or sooner if needed 2.multiple environmental allergies 3.previous herpes zoster May 2012  Note patient refuses flu shots, tho husband takes these.    Brenda Terrell P, MD   07/05/2012, 12:58 PM

## 2012-07-06 ENCOUNTER — Telehealth: Payer: Self-pay | Admitting: Internal Medicine

## 2012-07-06 NOTE — Telephone Encounter (Signed)
Talked to pt and gave her appt for February 2014 lab and MD

## 2012-07-07 ENCOUNTER — Other Ambulatory Visit: Payer: Self-pay

## 2012-07-07 MED ORDER — FAMCICLOVIR 500 MG PO TABS: 500.0000 mg | ORAL_TABLET | Freq: Three times a day (TID) | ORAL | Status: DC

## 2012-08-17 DIAGNOSIS — J45909 Unspecified asthma, uncomplicated: Secondary | ICD-10-CM | POA: Diagnosis not present

## 2012-08-17 DIAGNOSIS — E039 Hypothyroidism, unspecified: Secondary | ICD-10-CM | POA: Diagnosis not present

## 2012-10-12 DIAGNOSIS — H2510 Age-related nuclear cataract, unspecified eye: Secondary | ICD-10-CM | POA: Diagnosis not present

## 2012-10-12 DIAGNOSIS — H43399 Other vitreous opacities, unspecified eye: Secondary | ICD-10-CM | POA: Diagnosis not present

## 2012-10-12 DIAGNOSIS — H4011X0 Primary open-angle glaucoma, stage unspecified: Secondary | ICD-10-CM | POA: Diagnosis not present

## 2012-11-15 DIAGNOSIS — Z1231 Encounter for screening mammogram for malignant neoplasm of breast: Secondary | ICD-10-CM | POA: Diagnosis not present

## 2012-12-13 DIAGNOSIS — H409 Unspecified glaucoma: Secondary | ICD-10-CM | POA: Diagnosis not present

## 2012-12-13 DIAGNOSIS — H04129 Dry eye syndrome of unspecified lacrimal gland: Secondary | ICD-10-CM | POA: Diagnosis not present

## 2012-12-13 DIAGNOSIS — H4011X0 Primary open-angle glaucoma, stage unspecified: Secondary | ICD-10-CM | POA: Diagnosis not present

## 2013-01-29 ENCOUNTER — Telehealth: Payer: Self-pay | Admitting: Oncology

## 2013-01-29 ENCOUNTER — Other Ambulatory Visit: Payer: Self-pay

## 2013-01-29 NOTE — Telephone Encounter (Signed)
s.w. pt and advised on 2.25.14 appt....pt ok and aware

## 2013-02-05 ENCOUNTER — Ambulatory Visit: Payer: Medicare Other | Admitting: Oncology

## 2013-02-05 ENCOUNTER — Other Ambulatory Visit: Payer: Medicare Other | Admitting: Lab

## 2013-02-06 ENCOUNTER — Other Ambulatory Visit: Payer: Medicare Other | Admitting: Lab

## 2013-02-06 ENCOUNTER — Ambulatory Visit: Payer: Medicare Other | Admitting: Oncology

## 2013-02-20 ENCOUNTER — Ambulatory Visit (HOSPITAL_BASED_OUTPATIENT_CLINIC_OR_DEPARTMENT_OTHER): Payer: Medicare Other | Admitting: Oncology

## 2013-02-20 ENCOUNTER — Telehealth: Payer: Self-pay | Admitting: Oncology

## 2013-02-20 ENCOUNTER — Encounter: Payer: Self-pay | Admitting: Oncology

## 2013-02-20 ENCOUNTER — Other Ambulatory Visit (HOSPITAL_BASED_OUTPATIENT_CLINIC_OR_DEPARTMENT_OTHER): Payer: Medicare Other | Admitting: Lab

## 2013-02-20 VITALS — BP 144/76 | HR 77 | Temp 97.0°F | Resp 18 | Ht 60.0 in | Wt 137.0 lb

## 2013-02-20 DIAGNOSIS — D708 Other neutropenia: Secondary | ICD-10-CM | POA: Diagnosis not present

## 2013-02-20 DIAGNOSIS — C8300 Small cell B-cell lymphoma, unspecified site: Secondary | ICD-10-CM

## 2013-02-20 DIAGNOSIS — C9110 Chronic lymphocytic leukemia of B-cell type not having achieved remission: Secondary | ICD-10-CM | POA: Diagnosis not present

## 2013-02-20 DIAGNOSIS — C8599 Non-Hodgkin lymphoma, unspecified, extranodal and solid organ sites: Secondary | ICD-10-CM | POA: Diagnosis not present

## 2013-02-20 LAB — CBC WITH DIFFERENTIAL/PLATELET
Basophils Absolute: 0.1 10*3/uL (ref 0.0–0.1)
Eosinophils Absolute: 0.2 10*3/uL (ref 0.0–0.5)
HCT: 40.4 % (ref 34.8–46.6)
HGB: 13.6 g/dL (ref 11.6–15.9)
MONO#: 0.4 10*3/uL (ref 0.1–0.9)
NEUT%: 27.4 % — ABNORMAL LOW (ref 38.4–76.8)
WBC: 4.8 10*3/uL (ref 3.9–10.3)
lymph#: 2.9 10*3/uL (ref 0.9–3.3)

## 2013-02-20 LAB — COMPREHENSIVE METABOLIC PANEL (CC13)
ALT: 19 U/L (ref 0–55)
BUN: 16.8 mg/dL (ref 7.0–26.0)
CO2: 27 mEq/L (ref 22–29)
Calcium: 9.3 mg/dL (ref 8.4–10.4)
Chloride: 107 mEq/L (ref 98–107)
Creatinine: 0.9 mg/dL (ref 0.6–1.1)

## 2013-02-20 LAB — LACTATE DEHYDROGENASE: LDH: 210 U/L (ref 94–250)

## 2013-02-20 LAB — MORPHOLOGY: PLT EST: ADEQUATE

## 2013-02-20 NOTE — Telephone Encounter (Signed)
gv and printed appt schedule for pt for March,,,gv pt Barium...pt aware cs. ...will call with d/t of ct

## 2013-02-20 NOTE — Progress Notes (Signed)
OFFICE PROGRESS NOTE   02/20/2013   Physicians: E.Griffin (D.Simonds), (D.Jacobs) (Gockerman)  INTERVAL HISTORY:   Patient is seen, together with husband, in follow up of her B cell CLL and chronic leukopenia, presently alternating visits every 6 months with her PCP Dr Valentina Lucks. Patient felt badly for ~ a month after Christmas, with joint aching and fatigue but no fever or respiratory symptoms, did not see MD, is back to baseline now.  Patient has chronic leukopenia since at least 1986,  preceding the B cell CLL diagnosis which was made in Dec 2002. CLL diagnosis was made by biopsy of left cervical lymph node and bone marrow exam, CD20 positive by flow cytometry. She was treated for the CLL with Rituxan in 2005/ 2006 and again Jan 2008 thru Feb 2009, held then due to complaints of transient short term memory loss after each RItuxan treatment. Last CT AP was 11-2008 and last CT chest and neck was 05-2009.  Patient has had no fever or clear symptoms of infection in past month. Energy and appetite are at baseline. She has no different pain or joint symptoms. Bowels are moving as usual, with careful diet. No skin rash. No bleeding. No respiratory symptoms.  Remainder of 10 point Review of Systems negative/ unchanged..  Objective:  Vital signs in last 24 hours:  BP 144/76  Pulse 77  Temp(Src) 97 F (36.1 C) (Oral)  Resp 18  Ht 5' (1.524 m)  Wt 137 lb (62.143 kg)  BMI 26.76 kg/m2  Weight is up 3 lbs. Easily ambulatory, NAD. Respirations not labored RA.  HEENT:PERRLA, sclera clear, anicteric and oropharynx clear, no lesions Dull erythema bilateral posterior pharynx consistent with some post nasal drainage. Normal hair pattern Lymphatics Soft cervical lymph nodes bilaterally up to 1-2 cm, not tender L>R, some left supraclavicular similar nodes, none felt in axillae now. Resp: clear to auscultation bilaterally and normal percussion bilaterally Cardio: regular rate and rhythm GI: soft,  non-tender; bowel sounds normal; no masses,  no organomegaly. Soft rounded mass ~ 6-7 cm below sternum in midline abdomen, ~ 4 cm diameter, not tender, does not reduce. Extremities: extremities normal, atraumatic, no cyanosis or edema Neuro:nonfocal Breasts bilaterally without dominant mass, skin or nipple findings. Skin without rash or ecchymosis, including face.  Lab Results:  Results for orders placed in visit on 02/20/13  MORPHOLOGY      Result Value Range   RBC Comments Within Normal Limits  Within Normal Limits   White Cell Comments C/W auto diff     Other Comments Few Variant Lymphs     PLT EST Adequate  Adequate  CBC WITH DIFFERENTIAL      Result Value Range   WBC 4.8  3.9 - 10.3 10e3/uL   NEUT# 1.3 (*) 1.5 - 6.5 10e3/uL   HGB 13.6  11.6 - 15.9 g/dL   HCT 16.1  09.6 - 04.5 %   Platelets 176  145 - 400 10e3/uL   MCV 93.3  79.5 - 101.0 fL   MCH 31.4  25.1 - 34.0 pg   MCHC 33.6  31.5 - 36.0 g/dL   RBC 4.09  8.11 - 9.14 10e6/uL   RDW 15.0 (*) 11.2 - 14.5 %   lymph# 2.9  0.9 - 3.3 10e3/uL   MONO# 0.4  0.1 - 0.9 10e3/uL   Eosinophils Absolute 0.2  0.0 - 0.5 10e3/uL   Basophils Absolute 0.1  0.0 - 0.1 10e3/uL   NEUT% 27.4 (*) 38.4 - 76.8 %   LYMPH% 59.1 (*)  14.0 - 49.7 %   MONO% 7.9  0.0 - 14.0 %   EOS% 4.5  0.0 - 7.0 %   BASO% 1.1  0.0 - 2.0 %   BLood work reviewed with patient now -- WBC/ ANC actually better today. Patient does not recall having testing for Lyme. No thyroid function tests in this EMR, tho PCP is not in this system. Studies/Results: Last CT 2009 images reviewed, nothing that I can tell corresponds to the soft swelling in upper midline abdomen.  She has just had mammograms at [facility]-19[facility], understands that these were not remarkable. Medications: I have reviewed the patient's current medications.  Assessment/Plan:  1. CLL: clinically looks stable now, tho unusual symptoms in Dec-Jan similar to what she recalls prior to Rituxan. New area of fullness  upper abdomen. Discussed repeating CTs, which she agrees to do. I will see her back after scans. 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 I think reasonable to check lyme titers with next blood work done.  Patient was comfortable with plan above.   TIme spent including discussion and coordination of care 25 min.      Trinidi Toppins P, MD   02/20/2013, 12:06 PM

## 2013-03-06 ENCOUNTER — Ambulatory Visit (HOSPITAL_COMMUNITY)
Admission: RE | Admit: 2013-03-06 | Discharge: 2013-03-06 | Disposition: A | Discharge status: Home / Self Care | Payer: Medicare Other | Source: Ambulatory Visit | Attending: Oncology | Admitting: Oncology

## 2013-03-06 ENCOUNTER — Encounter (HOSPITAL_COMMUNITY): Payer: Self-pay

## 2013-03-06 DIAGNOSIS — I517 Cardiomegaly: Secondary | ICD-10-CM | POA: Insufficient documentation

## 2013-03-06 DIAGNOSIS — I700 Atherosclerosis of aorta: Secondary | ICD-10-CM | POA: Diagnosis not present

## 2013-03-06 DIAGNOSIS — K5730 Diverticulosis of large intestine without perforation or abscess without bleeding: Secondary | ICD-10-CM | POA: Insufficient documentation

## 2013-03-06 DIAGNOSIS — R599 Enlarged lymph nodes, unspecified: Secondary | ICD-10-CM | POA: Diagnosis not present

## 2013-03-06 DIAGNOSIS — K439 Ventral hernia without obstruction or gangrene: Secondary | ICD-10-CM | POA: Diagnosis not present

## 2013-03-06 DIAGNOSIS — K7689 Other specified diseases of liver: Secondary | ICD-10-CM | POA: Insufficient documentation

## 2013-03-06 DIAGNOSIS — C9110 Chronic lymphocytic leukemia of B-cell type not having achieved remission: Secondary | ICD-10-CM | POA: Diagnosis not present

## 2013-03-06 DIAGNOSIS — K769 Liver disease, unspecified: Secondary | ICD-10-CM | POA: Diagnosis not present

## 2013-03-06 DIAGNOSIS — J984 Other disorders of lung: Secondary | ICD-10-CM | POA: Diagnosis not present

## 2013-03-06 HISTORY — DX: Unspecified asthma, uncomplicated: J45.909

## 2013-03-06 MED ORDER — IOHEXOL 300 MG/ML  SOLN
100.0000 mL | Freq: Once | INTRAMUSCULAR | Status: AC | PRN
  Administered 2013-03-06: 100 mL via INTRAVENOUS

## 2013-03-21 ENCOUNTER — Ambulatory Visit (HOSPITAL_BASED_OUTPATIENT_CLINIC_OR_DEPARTMENT_OTHER): Payer: Medicare Other | Admitting: Oncology

## 2013-03-21 ENCOUNTER — Encounter: Payer: Self-pay | Admitting: Oncology

## 2013-03-21 ENCOUNTER — Telehealth: Payer: Self-pay | Admitting: Oncology

## 2013-03-21 ENCOUNTER — Other Ambulatory Visit (HOSPITAL_BASED_OUTPATIENT_CLINIC_OR_DEPARTMENT_OTHER): Payer: Medicare Other | Admitting: Lab

## 2013-03-21 VITALS — BP 140/84 | HR 76 | Temp 96.9°F | Resp 18 | Ht 60.0 in | Wt 138.0 lb

## 2013-03-21 DIAGNOSIS — C9110 Chronic lymphocytic leukemia of B-cell type not having achieved remission: Secondary | ICD-10-CM | POA: Diagnosis not present

## 2013-03-21 DIAGNOSIS — D72819 Decreased white blood cell count, unspecified: Secondary | ICD-10-CM

## 2013-03-21 LAB — CBC WITH DIFFERENTIAL/PLATELET
EOS%: 3 % (ref 0.0–7.0)
MCH: 30.9 pg (ref 25.1–34.0)
MCHC: 32.9 g/dL (ref 31.5–36.0)
MCV: 93.9 fL (ref 79.5–101.0)
MONO%: 8.4 % (ref 0.0–14.0)
RBC: 4.48 10*6/uL (ref 3.70–5.45)
RDW: 15.9 % — ABNORMAL HIGH (ref 11.2–14.5)

## 2013-03-21 LAB — COMPREHENSIVE METABOLIC PANEL (CC13)
AST: 47 U/L — ABNORMAL HIGH (ref 5–34)
Albumin: 3.8 g/dL (ref 3.5–5.0)
Alkaline Phosphatase: 84 U/L (ref 40–150)
Potassium: 5.1 mEq/L (ref 3.5–5.1)
Sodium: 143 mEq/L (ref 136–145)
Total Bilirubin: 0.43 mg/dL (ref 0.20–1.20)
Total Protein: 6.7 g/dL (ref 6.4–8.3)

## 2013-03-21 NOTE — Progress Notes (Signed)
OFFICE PROGRESS NOTE   03/21/2013   Physicians: E.Griffin (D.Simonds), (D.Jacobs) (Gockerman)   INTERVAL HISTORY:  Patient is seen, together with husband, in follow up of her B cell CLL and chronic leukopenia, with restaging CTs done 03-06-2013. These CTs show minimal increase in adenopathy and upper abdominal ventral hernia containing only fatty tissue.  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 CLL diagnosis was made by biopsy of left cervical lymph node and bone marrow exam, CD20 positive by flow cytometry. She was treated for the CLL with Rituxan in 2005/ 2006 and again Jan 2008 thru Feb 2009, held then due to complaints of transient short term memory loss after each RItuxan treatment. Most recent prior CT AP was 11-2008 and CT chest and neck was 05-2009. She had noticed soft, nontender fullness upper abdomen prior to my visit in Feb 2014.  Patient has had no new or different problems since she was here last, including no fever or symptoms of infection, no abdominal pain/N/V, no different or uncomfortable adenopathy. Energy is at baseline. No recurrent skin rash.  Remainder of 10 point Review of Systems negative.  Objective:  Vital signs in last 24 hours:  BP 140/84  Pulse 76  Temp(Src) 96.9 F (36.1 C) (Oral)  Resp 18  Ht 5' (1.524 m)  Wt 138 lb (62.596 kg)  BMI 26.95 kg/m2  Easily ambulatory, looks comfortable, very relieved by results of scans.  HEENT:PERRLA, sclera clear, anicteric and oropharynx clear, no lesions LymphaticsCervical, supraclavicular, and axillary nodes normal. and small bilateral cervical nodes nontender Resp: clear to auscultation bilaterally Cardio: regular rate and rhythm GI: soft, nontender including upper abdominal ventral hernia, normal bowel sounds Extremities: extremities normal, atraumatic, no cyanosis or edema Neuro:nonfocal Skin with no rash face or neck  Lab  Results:  Results for orders placed in visit on 03/21/13  CBC WITH DIFFERENTIAL      Result Value Range   WBC 5.7  3.9 - 10.3 10e3/uL   NEUT# 1.3 (*) 1.5 - 6.5 10e3/uL   HGB 13.8  11.6 - 15.9 g/dL   HCT 16.1  09.6 - 04.5 %   Platelets 167  145 - 400 10e3/uL   MCV 93.9  79.5 - 101.0 fL   MCH 30.9  25.1 - 34.0 pg   MCHC 32.9  31.5 - 36.0 g/dL   RBC 4.09  8.11 - 9.14 10e6/uL   RDW 15.9 (*) 11.2 - 14.5 %   lymph# 3.7 (*) 0.9 - 3.3 10e3/uL   MONO# 0.5  0.1 - 0.9 10e3/uL   Eosinophils Absolute 0.2  0.0 - 0.5 10e3/uL   Basophils Absolute 0.1  0.0 - 0.1 10e3/uL   NEUT% 22.7 (*) 38.4 - 76.8 %   LYMPH% 65.0 (*) 14.0 - 49.7 %   MONO% 8.4  0.0 - 14.0 %   EOS% 3.0  0.0 - 7.0 %   BASO% 0.9  0.0 - 2.0 %  COMPREHENSIVE METABOLIC PANEL (CC13)      Result Value Range   Sodium 143  136 - 145 mEq/L   Potassium 5.1  3.5 - 5.1 mEq/L   Chloride 106  98 - 107 mEq/L   CO2 30 (*) 22 - 29 mEq/L   Glucose 87  70 - 99 mg/dl   BUN 78.2  7.0 - 95.6 mg/dL   Creatinine 0.9  0.6 - 1.1 mg/dL   Total Bilirubin 2.13  0.20 - 1.20 mg/dL  Alkaline Phosphatase 84  40 - 150 U/L   AST 47 (*) 5 - 34 U/L   ALT 27  0 - 55 U/L   Total Protein 6.7  6.4 - 8.3 g/dL   Albumin 3.8  3.5 - 5.0 g/dL   Calcium 43.3  8.4 - 29.5 mg/dL    Available after visit, B.burgdorfi antibody not detected (this drawn due to consideration of lyme disease with some of her previous symptoms)  Studies/Results: CT CHEST, ABDOMEN AND PELVIS WITH CONTRAST 03-06-2013 Technique: Multidetector CT imaging of the chest, abdomen and  pelvis was performed following the standard protocol during bolus  administration of intravenous contrast.  Contrast: OMNIPAQUE IOHEXOL 300 MG/ML SOLN  Comparison: CT chest abdomen pelvis 12/23/2008 and 10/31/2006  CT CHEST  Findings: The inferior neck is included on this CT of the chest,  and demonstrates and eight by 11 mm right jugular chain lymph node  11 x 12 mm left jugular chain lymph node. There is a  prominent  number of supraclavicular lymph nodes bilaterally, left greater  than right, that appear similar to the chest CT of December 2009.  Index 11 mm short axis left supraclavicular lymph node is stable.  Bilateral axillary lymphadenopathy persists. Largest axillary lymph  node is on the left short axis, stable. An index right axillary  lymph node measures 11 mm short axis (increased from 9 mm). A  second index right axillary lymph node measures 12 mm short axis,  stable.  Mediastinal lymphadenopathy appears slightly increased compared to  12/23/2008. Index low right paratracheal lymph node measures 14 mm  short axis (previously 11 mm). Index AP window lymph node measures  11 mm short axis (previously 8 mm). Prevascular lymph nodes are  prominent number not pathologically enlarged.  Hilar nodal tissue is slightly more prominent compared to 2009.  Left hilar lymph node measures up to 12.8 mm short axis  (previously 8.6 mm). Right hilar nodal tissue measures up to 7.6  mm short axis (previously 4 mm).  Esophagus is unremarkable. Stable cardiomegaly. Normal caliber  thoracic aorta with atherosclerotic calcification along the  thoracic arch. Negative for pleural or pericardial effusion.  Lung windows demonstrates some stable peripheral parenchymal  scarring at both lung bases, right greater than left. Negative for  airspace disease or pulmonary mass. The trachea mainstem bronchi  are patent.  Typical degenerative changes of the lower cervical and thoracic  spine. No acute or suspicious bony abnormality is identified.  IMPRESSION:  1. Supraclavicular, axillary, mediastinal, and hilar  lymphadenopathy, consistent with chronic lymphocytic leukemia. Many  of the thoracic lymph nodes are stable in size, and some are  slightly increased in size compared to the CT of 2009  2. Stable cardiomegaly.  CT ABDOMEN AND PELVIS  Findings: There is mild diffuse fatty infiltration of the liver.   Single tiny calcified hepatic granuloma. No suspicious hepatic  lesions or biliary ductal dilatation. The spleen is normal in size  and enhancement. There are no focal splenic lesions. The pancreas  and common bile duct are normal in appearance. The adrenal glands  and kidneys are normal bilaterally.  The ureters are normal in caliber.  There is a small supraumbilical midline abdominal wall hernia, at  the level of the left liver lobe of the liver. This hernia has  increased in size since the CT of 2009. Question if this could be  the soft palpable mass felt on physical exam. The hernia sac  contains fat only and measures  2.5 x 1.6 x 2.9 cm.  The stomach and small bowel appear within normal limits. The colon  is normal in caliber and demonstrates diverticulosis in the sigmoid  region. The uterus, adnexa, and urinary bladder appear within  normal limits.  The abdominal aorta is normal in caliber and the branch vessels are  patent. Abdominal and pelvic retroperitoneal lymphadenopathy is  present. An index periaortic lymph node just to the right of  midline measures 16 mm short axis (previously 13 mm). Index left  periaortic retroperitoneal lymph node measures 10 mm short axis  (previously 7 mm) 2nd index left periaortic lymph node measures 15  mm short axis (previously 12 mm short axis.  Left common iliac index lymph node measures 20 mm short axis  (previously 16 mm short axis. Index right common iliac  lymphadenopathy measures 14 mm short axis (previously 9 mm short  axis). Right pelvic sidewall lymphadenopathy measures 13 mm short  axis (previously 10 mm short axis. Left pelvic sidewall  lymphadenopathy measures 14 mm short axis, stable.  Porta hepatis lymph node measures 12 mm short axis, stable.  Heterogeneous increased density is noted within the bone marrow of  the proximal femurs.  There are typical degenerative changes of the lumbar spine. There  are degenerative changes of  the pubic symphysis. No acute bony  abnormality is identified.  IMPRESSION:  1. Mildly progressive abdominal and pelvic lymphadenopathy in this  patient with known chronic lymphocytic leukemia.  2. Increased density of the bone marrow of the proximal femurs  likely due to CLL.  3. Midline supraumbilical upper abdominal wall hernia containing  fat only as described above. Question if this could be the cause  of the soft palpable mass on clinical exam.  4. Colonic diverticulosis.   Medications: I have reviewed the patient's current medications.  We have discussed findings on CT as above.  She is to see Dr Maurice Small in August. I will schedule her back to this office 6 months from that visit  Assessment/Plan: 1. CLL: clinically stable including by CTs now. New area of fullness upper abdomen is fat containing ventral hernia by CT. I will see her back alternating visits with PCP ~ every 6 months. 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 We will let patient know. 6.degenerative changes in spine by CT 7.diverticular changes in colon by CT   Muad Noga P, MD   03/21/2013, 5:14 PM

## 2013-03-21 NOTE — Telephone Encounter (Signed)
gv pt appt schedule for February 2015.

## 2013-03-22 ENCOUNTER — Telehealth: Payer: Self-pay | Admitting: *Deleted

## 2013-03-22 NOTE — Telephone Encounter (Signed)
Left message on home phone that test for lyme disease came back negative

## 2013-03-22 NOTE — Telephone Encounter (Signed)
Message copied by Phillis Knack on Thu Mar 22, 2013 11:37 AM ------      Message from: Jama Flavors P      Created: Thu Mar 22, 2013 11:18 AM       Labs seen and need follow up: please let her know the test for lyme disease came back negative            Cc TH, LA ------

## 2013-04-18 DIAGNOSIS — J069 Acute upper respiratory infection, unspecified: Secondary | ICD-10-CM | POA: Diagnosis not present

## 2013-06-14 DIAGNOSIS — H31019 Macula scars of posterior pole (postinflammatory) (post-traumatic), unspecified eye: Secondary | ICD-10-CM | POA: Diagnosis not present

## 2013-06-14 DIAGNOSIS — H2510 Age-related nuclear cataract, unspecified eye: Secondary | ICD-10-CM | POA: Diagnosis not present

## 2013-06-14 DIAGNOSIS — H04129 Dry eye syndrome of unspecified lacrimal gland: Secondary | ICD-10-CM | POA: Diagnosis not present

## 2013-06-14 DIAGNOSIS — H43819 Vitreous degeneration, unspecified eye: Secondary | ICD-10-CM | POA: Diagnosis not present

## 2013-06-14 DIAGNOSIS — H409 Unspecified glaucoma: Secondary | ICD-10-CM | POA: Diagnosis not present

## 2013-06-14 DIAGNOSIS — H40059 Ocular hypertension, unspecified eye: Secondary | ICD-10-CM | POA: Diagnosis not present

## 2013-08-01 ENCOUNTER — Other Ambulatory Visit: Payer: Self-pay

## 2013-08-22 DIAGNOSIS — J45909 Unspecified asthma, uncomplicated: Secondary | ICD-10-CM | POA: Diagnosis not present

## 2013-08-22 DIAGNOSIS — Z131 Encounter for screening for diabetes mellitus: Secondary | ICD-10-CM | POA: Diagnosis not present

## 2013-08-22 DIAGNOSIS — Z0000 Encounter for general adult medical examination without abnormal findings: Secondary | ICD-10-CM | POA: Diagnosis not present

## 2013-08-22 DIAGNOSIS — C8589 Other specified types of non-Hodgkin lymphoma, extranodal and solid organ sites: Secondary | ICD-10-CM | POA: Diagnosis not present

## 2013-08-22 DIAGNOSIS — E039 Hypothyroidism, unspecified: Secondary | ICD-10-CM | POA: Diagnosis not present

## 2013-08-22 DIAGNOSIS — J301 Allergic rhinitis due to pollen: Secondary | ICD-10-CM | POA: Diagnosis not present

## 2013-08-22 DIAGNOSIS — E049 Nontoxic goiter, unspecified: Secondary | ICD-10-CM | POA: Diagnosis not present

## 2013-08-22 DIAGNOSIS — M810 Age-related osteoporosis without current pathological fracture: Secondary | ICD-10-CM | POA: Diagnosis not present

## 2013-08-22 DIAGNOSIS — E559 Vitamin D deficiency, unspecified: Secondary | ICD-10-CM | POA: Diagnosis not present

## 2013-11-01 ENCOUNTER — Other Ambulatory Visit: Payer: Self-pay

## 2013-11-19 DIAGNOSIS — E559 Vitamin D deficiency, unspecified: Secondary | ICD-10-CM | POA: Diagnosis not present

## 2013-11-19 DIAGNOSIS — M899 Disorder of bone, unspecified: Secondary | ICD-10-CM | POA: Diagnosis not present

## 2013-11-19 DIAGNOSIS — Z803 Family history of malignant neoplasm of breast: Secondary | ICD-10-CM | POA: Diagnosis not present

## 2013-11-19 DIAGNOSIS — Z1231 Encounter for screening mammogram for malignant neoplasm of breast: Secondary | ICD-10-CM | POA: Diagnosis not present

## 2013-12-04 DIAGNOSIS — E559 Vitamin D deficiency, unspecified: Secondary | ICD-10-CM | POA: Diagnosis not present

## 2013-12-06 DIAGNOSIS — H409 Unspecified glaucoma: Secondary | ICD-10-CM | POA: Diagnosis not present

## 2013-12-06 DIAGNOSIS — H4011X0 Primary open-angle glaucoma, stage unspecified: Secondary | ICD-10-CM | POA: Diagnosis not present

## 2013-12-06 DIAGNOSIS — D869 Sarcoidosis, unspecified: Secondary | ICD-10-CM | POA: Diagnosis not present

## 2013-12-06 DIAGNOSIS — H2510 Age-related nuclear cataract, unspecified eye: Secondary | ICD-10-CM | POA: Diagnosis not present

## 2014-01-29 ENCOUNTER — Encounter: Payer: Self-pay | Admitting: Oncology

## 2014-02-05 ENCOUNTER — Other Ambulatory Visit (HOSPITAL_BASED_OUTPATIENT_CLINIC_OR_DEPARTMENT_OTHER): Payer: Medicare Other

## 2014-02-05 ENCOUNTER — Encounter: Payer: Self-pay | Admitting: Internal Medicine

## 2014-02-05 ENCOUNTER — Ambulatory Visit (HOSPITAL_BASED_OUTPATIENT_CLINIC_OR_DEPARTMENT_OTHER): Payer: Medicare Other | Admitting: Internal Medicine

## 2014-02-05 ENCOUNTER — Telehealth: Payer: Self-pay | Admitting: Oncology

## 2014-02-05 VITALS — BP 151/83 | HR 71 | Temp 97.6°F | Resp 18 | Ht 60.0 in | Wt 135.6 lb

## 2014-02-05 DIAGNOSIS — D72819 Decreased white blood cell count, unspecified: Secondary | ICD-10-CM

## 2014-02-05 DIAGNOSIS — C9110 Chronic lymphocytic leukemia of B-cell type not having achieved remission: Secondary | ICD-10-CM

## 2014-02-05 LAB — COMPREHENSIVE METABOLIC PANEL (CC13)
ALBUMIN: 3.8 g/dL (ref 3.5–5.0)
ALK PHOS: 75 U/L (ref 40–150)
ALT: 24 U/L (ref 0–55)
AST: 43 U/L — AB (ref 5–34)
Anion Gap: 9 mEq/L (ref 3–11)
BUN: 14.4 mg/dL (ref 7.0–26.0)
CO2: 29 mEq/L (ref 22–29)
Calcium: 9.6 mg/dL (ref 8.4–10.4)
Chloride: 106 mEq/L (ref 98–109)
Creatinine: 0.8 mg/dL (ref 0.6–1.1)
Glucose: 86 mg/dl (ref 70–140)
POTASSIUM: 4.3 meq/L (ref 3.5–5.1)
SODIUM: 144 meq/L (ref 136–145)
TOTAL PROTEIN: 6.2 g/dL — AB (ref 6.4–8.3)
Total Bilirubin: 0.55 mg/dL (ref 0.20–1.20)

## 2014-02-05 LAB — CBC WITH DIFFERENTIAL/PLATELET
BASO%: 0.7 % (ref 0.0–2.0)
Basophils Absolute: 0 10*3/uL (ref 0.0–0.1)
EOS%: 2.7 % (ref 0.0–7.0)
Eosinophils Absolute: 0.2 10*3/uL (ref 0.0–0.5)
HEMATOCRIT: 39.2 % (ref 34.8–46.6)
HEMOGLOBIN: 13.1 g/dL (ref 11.6–15.9)
LYMPH#: 4 10*3/uL — AB (ref 0.9–3.3)
LYMPH%: 70.9 % — ABNORMAL HIGH (ref 14.0–49.7)
MCH: 31.3 pg (ref 25.1–34.0)
MCHC: 33.4 g/dL (ref 31.5–36.0)
MCV: 93.6 fL (ref 79.5–101.0)
MONO#: 0.3 10*3/uL (ref 0.1–0.9)
MONO%: 5.7 % (ref 0.0–14.0)
NEUT#: 1.1 10*3/uL — ABNORMAL LOW (ref 1.5–6.5)
NEUT%: 20 % — ABNORMAL LOW (ref 38.4–76.8)
PLATELETS: 188 10*3/uL (ref 145–400)
RBC: 4.19 10*6/uL (ref 3.70–5.45)
RDW: 15.2 % — AB (ref 11.2–14.5)
WBC: 5.6 10*3/uL (ref 3.9–10.3)

## 2014-02-05 NOTE — Telephone Encounter (Signed)
Pt wants to go back to Dr. Marko Plume, sent a reminder to patient and me to schedule pt for next year, template not available

## 2014-02-06 NOTE — Progress Notes (Signed)
[facility] OFFICE PROGRESS NOTE  Osborne Casco, MD [address] [place] [address]  DIAGNOSIS: CLL (chronic lymphocytic leukemia) - Plan: CBC with Differential, Comprehensive metabolic panel  Leukopenia  Chief Complaint  Patient presents with  . CLL (chronic lymphocytic leukemia)    CURRENT THERAPY: Observation.   INTERVAL HISTORY: Brenda Terrell 73 y.o. female with a history of CLL since December 2002 is here for follow up.  She was last seen by Dr. Marko Plume on 03/21/2013.  CLL diagnosis was made by biopsy of left cervical lymph node and bone marrow exam, CD20 positive by flow cytometry. She was treated for the CLL with Rituxan in 2005/ 2006 and again Jan 2008 thru Feb 2009, held then due to complaints of transient short term memory loss after each RItuxan treatment. Most recent prior CT AP was 11-2008 and CT chest and neck was 05-2009. She had noticed soft, nontender fullness upper abdomen prior to my visit in Feb 2014.  Today, she is accompanied by her husband.  Her last restaging CTs done on 3-10/2013 demonstrated increase in her adenopathy minimally and upper abdominal ventral hernia containing only fatty tissue.  Patient has had no new or different problems since she was here last, including no fever or symptoms of infection, no abdominal pain/N/V, no different or uncomfortable adenopathy. Energy is at baseline. No recurrent skin rash.   MEDICAL HISTORY: Past Medical History  Diagnosis Date  . GERD (gastroesophageal reflux disease)   . Blood transfusion     POST D&C  . Mitral valve prolapse   . Arthritis     HIP  . History of migraines   . Goiter   . 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 Asthma     INTERIM HISTORY: has CLL (chronic lymphocytic leukemia); Leukopenia; and Cutaneous sarcoidosis on her problem list.    ALLERGIES:  is allergic to gluten meal; influenza virus  vacc split pf; nutrasweet aspartame; salicylates cross reactors; and sulfa antibiotics.  MEDICATIONS: has a current medication list which includes the following prescription(s): multiple vitamins-minerals and OVER THE COUNTER MEDICATION.  SURGICAL HISTORY: No past surgical history on file.  REVIEW OF SYSTEMS:   Constitutional: Denies fevers, chills or abnormal weight loss Eyes: Denies blurriness of vision Ears, nose, mouth, throat, and face: Denies mucositis or sore throat Respiratory: Denies cough, dyspnea or wheezes Cardiovascular: Denies palpitation, chest discomfort or lower extremity swelling Gastrointestinal:  Denies nausea, heartburn or change in bowel habits Skin: Denies abnormal skin rashes Lymphatics: Denies new lymphadenopathy or easy bruising Neurological:Denies numbness, tingling or new weaknesses Behavioral/Psych: Mood is stable, no new changes  All other systems were reviewed with the patient and are negative.  PHYSICAL EXAMINATION: ECOG PERFORMANCE STATUS: 0 - Asymptomatic  Blood pressure 151/83, pulse 71, temperature 97.6 F (36.4 C), temperature source Oral, resp. rate 18, height 5' (1.524 m), weight 135 lb 9.6 oz (61.508 kg).  GENERAL:alert, no distress and comfortable, elderly female who is easily ambulatroy SKIN: skin color, texture, turgor are normal, no rashes or significant lesions EYES: normal, Conjunctiva are pink and non-injected, sclera clear OROPHARYNX:no exudate, no erythema and lips, buccal mucosa, and tongue normal  NECK: supple, thyroid normal size, non-tender, without nodularity LYMPH: palpable lymphadenopathy in the cervical bilaterally (less than 1 cm), no palpable  axillary or supraclavicular lymph nodes LUNGS: clear to auscultation and percussion with normal breathing effort HEART: regular rate & rhythm and no murmurs  and no lower extremity edema ABDOMEN:abdomen soft, non-tender and normal bowel sounds Musculoskeletal:no cyanosis of digits and  no clubbing  NEURO: alert & oriented x 3 with fluent speech, no focal motor/sensory deficits   LABORATORY DATA: Results for orders placed in visit on 02/05/14 (from the past 48 hour(s))  CBC WITH DIFFERENTIAL     Status: Abnormal   Collection Time    02/05/14 10:57 AM      Result Value Ref Range   WBC 5.6  3.9 - 10.3 10e3/uL   NEUT# 1.1 (*) 1.5 - 6.5 10e3/uL   HGB 13.1  11.6 - 15.9 g/dL   HCT 10.2  72.5 - 36.6 %   Platelets 188  145 - 400 10e3/uL   MCV 93.6  79.5 - 101.0 fL   MCH 31.3  25.1 - 34.0 pg   MCHC 33.4  31.5 - 36.0 g/dL   RBC 4.40  3.47 - 4.25 10e6/uL   RDW 15.2 (*) 11.2 - 14.5 %   lymph# 4.0 (*) 0.9 - 3.3 10e3/uL   MONO# 0.3  0.1 - 0.9 10e3/uL   Eosinophils Absolute 0.2  0.0 - 0.5 10e3/uL   Basophils Absolute 0.0  0.0 - 0.1 10e3/uL   NEUT% 20.0 (*) 38.4 - 76.8 %   LYMPH% 70.9 (*) 14.0 - 49.7 %   MONO% 5.7  0.0 - 14.0 %   EOS% 2.7  0.0 - 7.0 %   BASO% 0.7  0.0 - 2.0 %  COMPREHENSIVE METABOLIC PANEL (CC13)     Status: Abnormal   Collection Time    02/05/14 10:58 AM      Result Value Ref Range   Sodium 144  136 - 145 mEq/L   Potassium 4.3  3.5 - 5.1 mEq/L   Chloride 106  98 - 109 mEq/L   CO2 29  22 - 29 mEq/L   Glucose 86  70 - 140 mg/dl   BUN 95.6  7.0 - 38.7 mg/dL   Creatinine 0.8  0.6 - 1.1 mg/dL   Total Bilirubin 5.64  0.20 - 1.20 mg/dL   Alkaline Phosphatase 75  40 - 150 U/L   AST 43 (*) 5 - 34 U/L   ALT 24  0 - 55 U/L   Total Protein 6.2 (*) 6.4 - 8.3 g/dL   Albumin 3.8  3.5 - 5.0 g/dL   Calcium 9.6  8.4 - 33.2 mg/dL   Anion Gap 9  3 - 11 mEq/L       Labs:  Lab Results  Component Value Date   WBC 5.6 02/05/2014   HGB 13.1 02/05/2014   HCT 39.2 02/05/2014   MCV 93.6 02/05/2014   PLT 188 02/05/2014   NEUTROABS 1.1* 02/05/2014      Chemistry      Component Value Date/Time   NA 144 02/05/2014 1058   NA 141 07/05/2012 1042   K 4.3 02/05/2014 1058   K 4.0 07/05/2012 1042   CL 106 03/21/2013 1302   CL 105 07/05/2012 1042   CO2 29 02/05/2014 1058    CO2 29 07/05/2012 1042   BUN 14.4 02/05/2014 1058   BUN 15 07/05/2012 1042   CREATININE 0.8 02/05/2014 1058   CREATININE 0.85 07/05/2012 1042      Component Value Date/Time   CALCIUM 9.6 02/05/2014 1058   CALCIUM 9.5 07/05/2012 1042   ALKPHOS 75 02/05/2014 1058   ALKPHOS 61 07/05/2012 1042   AST 43* 02/05/2014 1058   AST 32 07/05/2012 1042   ALT  24 02/05/2014 1058   ALT 15 07/05/2012 1042   BILITOT 0.55 02/05/2014 1058   BILITOT 0.5 07/05/2012 1042        Basic Metabolic Panel:  Recent Labs Lab 02/05/14 1058  NA 144  K 4.3  CO2 29  GLUCOSE 86  BUN 14.4  CREATININE 0.8  CALCIUM 9.6   GFR Estimated Creatinine Clearance: 51.3 ml/min (by C-G formula based on Cr of 0.8). Liver Function Tests:  Recent Labs Lab 02/05/14 1058  AST 43*  ALT 24  ALKPHOS 75  BILITOT 0.55  PROT 6.2*  ALBUMIN 3.8   CBC:  Recent Labs Lab 02/05/14 1057  WBC 5.6  NEUTROABS 1.1*  HGB 13.1  HCT 39.2  MCV 93.6  PLT 188   Studies:  No results found.   RADIOGRAPHIC STUDIES: CT CHEST, ABDOMEN AND PELVIS WITH CONTRAST 03-06-2013  Technique: Multidetector CT imaging of the chest, abdomen and  pelvis was performed following the standard protocol during bolus  administration of intravenous contrast.  Contrast: 15mL OMNIPAQUE IOHEXOL 300 MG/ML SOLN  Comparison: CT chest abdomen pelvis 12/23/2008 and 10/31/2006  CT CHEST  Findings: The inferior neck is included on this CT of the chest,  and demonstrates and eight by 11 mm right jugular chain lymph node  11 x 12 mm left jugular chain lymph node. There is a prominent  number of supraclavicular lymph nodes bilaterally, left greater  than right, that appear similar to the chest CT of December 2009.  Index 11 mm short axis left supraclavicular lymph node is stable.  Bilateral axillary lymphadenopathy persists. Largest axillary lymph  node is on the left short axis, stable. An index right axillary  lymph node measures 11 mm short axis (increased from  9 mm). A  second index right axillary lymph node measures 12 mm short axis,  stable.  Mediastinal lymphadenopathy appears slightly increased compared to  12/23/2008. Index low right paratracheal lymph node measures 14 mm  short axis (previously 11 mm). Index AP window lymph node measures  11 mm short axis (previously 8 mm). Prevascular lymph nodes are  prominent number not pathologically enlarged.  Hilar nodal tissue is slightly more prominent compared to 2009.  Left hilar lymph node measures up to 12.8 mm short axis  (previously 8.6 mm). Right hilar nodal tissue measures up to 7.6  mm short axis (previously 4 mm).  Esophagus is unremarkable. Stable cardiomegaly. Normal caliber  thoracic aorta with atherosclerotic calcification along the  thoracic arch. Negative for pleural or pericardial effusion.  Lung windows demonstrates some stable peripheral parenchymal  scarring at both lung bases, right greater than left. Negative for  airspace disease or pulmonary mass. The trachea mainstem bronchi  are patent.  Typical degenerative changes of the lower cervical and thoracic  spine. No acute or suspicious bony abnormality is identified.  IMPRESSION:  1. Supraclavicular, axillary, mediastinal, and hilar  lymphadenopathy, consistent with chronic lymphocytic leukemia. Many  of the thoracic lymph nodes are stable in size, and some are  slightly increased in size compared to the CT of 2009  2. Stable cardiomegaly.  CT ABDOMEN AND PELVIS  Findings: There is mild diffuse fatty infiltration of the liver.  Single tiny calcified hepatic granuloma. No suspicious hepatic  lesions or biliary ductal dilatation. The spleen is normal in size  and enhancement. There are no focal splenic lesions. The pancreas  and common bile duct are normal in appearance. The adrenal glands  and kidneys are normal bilaterally.  The ureters are normal  in caliber.  There is a small supraumbilical midline abdominal wall  hernia, at  the level of the left liver lobe of the liver. This hernia has  increased in size since the CT of 2009. Question if this could be  the soft palpable mass felt on physical exam. The hernia sac  contains fat only and measures 2.5 x 1.6 x 2.9 cm.  The stomach and small bowel appear within normal limits. The colon  is normal in caliber and demonstrates diverticulosis in the sigmoid  region. The uterus, adnexa, and urinary bladder appear within  normal limits.  The abdominal aorta is normal in caliber and the branch vessels are  patent. Abdominal and pelvic retroperitoneal lymphadenopathy is  present. An index periaortic lymph node just to the right of  midline measures 16 mm short axis (previously 13 mm). Index left  periaortic retroperitoneal lymph node measures 10 mm short axis  (previously 7 mm) 2nd index left periaortic lymph node measures 15  mm short axis (previously 12 mm short axis.  Left common iliac index lymph node measures 20 mm short axis  (previously 16 mm short axis. Index right common iliac  lymphadenopathy measures 14 mm short axis (previously 9 mm short  axis). Right pelvic sidewall lymphadenopathy measures 13 mm short  axis (previously 10 mm short axis. Left pelvic sidewall  lymphadenopathy measures 14 mm short axis, stable.  Porta hepatis lymph node measures 12 mm short axis, stable.  Heterogeneous increased density is noted within the bone marrow of  the proximal femurs.  There are typical degenerative changes of the lumbar spine. There  are degenerative changes of the pubic symphysis. No acute bony  abnormality is identified.  IMPRESSION:  1. Mildly progressive abdominal and pelvic lymphadenopathy in this  patient with known chronic lymphocytic leukemia.  2. Increased density of the bone marrow of the proximal femurs  likely due to CLL.  3. Midline supraumbilical upper abdominal wall hernia containing  fat only as described above. Question if this could  be the cause  of the soft palpable mass on clinical exam.  4. Colonic diverticulosis.   ASSESSMENT: Brenda Terrell 73 y.o. female with a history of CLL (chronic lymphocytic leukemia) - Plan: CBC with Differential, Comprehensive metabolic panel  Leukopenia   PLAN:   1. CLL: Clinically stable including by CTs last year. New area of fullness upper abdomen is fat containing ventral hernia by CT.She will see Dr. Marko Plume back alternating visits with PCP ~ every 6 months.  2.chronic benign leukopenia stable  3.cutaneous sarcoid: not symptomatic now  4.environmental allergies and intolerances, including gluten 5.degenerative changes in spine by CT  6.diverticular changes in colon by CT  All questions were answered. The patient knows to call the clinic with any problems, questions or concerns. We can certainly see the patient much sooner if necessary.  I spent 15 minutes counseling the patient face to face. The total time spent in the appointment was 25 minutes. Time spent researching patient chart.     Eevee Borbon, MD 02/06/2014 5:11 AM

## 2014-06-10 DIAGNOSIS — H409 Unspecified glaucoma: Secondary | ICD-10-CM | POA: Diagnosis not present

## 2014-06-10 DIAGNOSIS — H524 Presbyopia: Secondary | ICD-10-CM | POA: Diagnosis not present

## 2014-06-10 DIAGNOSIS — H4011X0 Primary open-angle glaucoma, stage unspecified: Secondary | ICD-10-CM | POA: Diagnosis not present

## 2014-06-10 DIAGNOSIS — H2510 Age-related nuclear cataract, unspecified eye: Secondary | ICD-10-CM | POA: Diagnosis not present

## 2014-08-26 DIAGNOSIS — E049 Nontoxic goiter, unspecified: Secondary | ICD-10-CM | POA: Diagnosis not present

## 2014-08-26 DIAGNOSIS — Z0000 Encounter for general adult medical examination without abnormal findings: Secondary | ICD-10-CM | POA: Diagnosis not present

## 2014-08-26 DIAGNOSIS — J45909 Unspecified asthma, uncomplicated: Secondary | ICD-10-CM | POA: Diagnosis not present

## 2014-08-26 DIAGNOSIS — Z23 Encounter for immunization: Secondary | ICD-10-CM | POA: Diagnosis not present

## 2014-08-26 DIAGNOSIS — J301 Allergic rhinitis due to pollen: Secondary | ICD-10-CM | POA: Diagnosis not present

## 2014-08-26 DIAGNOSIS — C8589 Other specified types of non-Hodgkin lymphoma, extranodal and solid organ sites: Secondary | ICD-10-CM | POA: Diagnosis not present

## 2014-08-26 DIAGNOSIS — M810 Age-related osteoporosis without current pathological fracture: Secondary | ICD-10-CM | POA: Diagnosis not present

## 2014-08-26 DIAGNOSIS — E039 Hypothyroidism, unspecified: Secondary | ICD-10-CM | POA: Diagnosis not present

## 2014-08-26 DIAGNOSIS — Z131 Encounter for screening for diabetes mellitus: Secondary | ICD-10-CM | POA: Diagnosis not present

## 2014-09-11 ENCOUNTER — Other Ambulatory Visit: Payer: Self-pay | Admitting: Oncology

## 2014-09-11 ENCOUNTER — Telehealth: Payer: Self-pay | Admitting: Oncology

## 2014-09-11 NOTE — Telephone Encounter (Signed)
per pof to sch pt appt for feb2016-will print & give to [facility] M-LL sch not opened ye

## 2014-09-11 NOTE — Progress Notes (Signed)
Medical Oncology    CBC diff received from Dr Kelton Pillar dated 08-26-2014. Copy sent to be scanned into EMR, results as follows: WBC 6.2, ANC 1.1, Hgb 14.1, plt 168k, lymphs 73.7%.    Patient seen last at this office by Dr Concha Norway [phone], plan to alternate visits every 6 months with primary MD and this office. POF sent to schedulers for visit with this MD + lab ~ Feb 2016.  Godfrey Pick, MD

## 2014-10-11 ENCOUNTER — Other Ambulatory Visit: Payer: Self-pay

## 2014-10-12 ENCOUNTER — Telehealth: Payer: Self-pay | Admitting: Oncology

## 2014-11-28 DIAGNOSIS — Z803 Family history of malignant neoplasm of breast: Secondary | ICD-10-CM | POA: Diagnosis not present

## 2014-11-28 DIAGNOSIS — Z1231 Encounter for screening mammogram for malignant neoplasm of breast: Secondary | ICD-10-CM | POA: Diagnosis not present

## 2014-11-30 ENCOUNTER — Telehealth: Payer: Self-pay | Admitting: Oncology

## 2014-11-30 NOTE — Telephone Encounter (Signed)
lvm for pt regarding to Feb d.t. change....mailed pt appt sched and letter

## 2014-12-13 DIAGNOSIS — H4010X0 Unspecified open-angle glaucoma, stage unspecified: Secondary | ICD-10-CM | POA: Diagnosis not present

## 2014-12-13 DIAGNOSIS — H409 Unspecified glaucoma: Secondary | ICD-10-CM | POA: Diagnosis not present

## 2015-02-09 ENCOUNTER — Other Ambulatory Visit: Payer: Self-pay | Admitting: Oncology

## 2015-02-09 DIAGNOSIS — D72819 Decreased white blood cell count, unspecified: Secondary | ICD-10-CM

## 2015-02-09 DIAGNOSIS — C9110 Chronic lymphocytic leukemia of B-cell type not having achieved remission: Secondary | ICD-10-CM

## 2015-02-12 ENCOUNTER — Ambulatory Visit: Payer: Medicare Other | Admitting: Oncology

## 2015-02-12 ENCOUNTER — Other Ambulatory Visit: Payer: Medicare Other

## 2015-02-13 ENCOUNTER — Ambulatory Visit (HOSPITAL_BASED_OUTPATIENT_CLINIC_OR_DEPARTMENT_OTHER): Payer: Medicare Other | Admitting: Oncology

## 2015-02-13 ENCOUNTER — Other Ambulatory Visit (HOSPITAL_BASED_OUTPATIENT_CLINIC_OR_DEPARTMENT_OTHER): Payer: Medicare Other

## 2015-02-13 ENCOUNTER — Encounter: Payer: Self-pay | Admitting: Oncology

## 2015-02-13 ENCOUNTER — Telehealth: Payer: Self-pay | Admitting: Oncology

## 2015-02-13 VITALS — BP 138/77 | HR 76 | Temp 97.7°F | Resp 18 | Ht 60.0 in | Wt 135.0 lb

## 2015-02-13 DIAGNOSIS — R922 Inconclusive mammogram: Secondary | ICD-10-CM | POA: Diagnosis not present

## 2015-02-13 DIAGNOSIS — D72818 Other decreased white blood cell count: Secondary | ICD-10-CM | POA: Diagnosis not present

## 2015-02-13 DIAGNOSIS — C9110 Chronic lymphocytic leukemia of B-cell type not having achieved remission: Secondary | ICD-10-CM | POA: Diagnosis not present

## 2015-02-13 DIAGNOSIS — D72819 Decreased white blood cell count, unspecified: Secondary | ICD-10-CM

## 2015-02-13 LAB — CBC WITH DIFFERENTIAL/PLATELET
BASO%: 0.8 % (ref 0.0–2.0)
BASOS ABS: 0.1 10*3/uL (ref 0.0–0.1)
EOS%: 2.6 % (ref 0.0–7.0)
Eosinophils Absolute: 0.2 10*3/uL (ref 0.0–0.5)
HCT: 43.5 % (ref 34.8–46.6)
HEMOGLOBIN: 13.9 g/dL (ref 11.6–15.9)
LYMPH#: 5.5 10*3/uL — AB (ref 0.9–3.3)
LYMPH%: 75.7 % — ABNORMAL HIGH (ref 14.0–49.7)
MCH: 30.8 pg (ref 25.1–34.0)
MCHC: 31.9 g/dL (ref 31.5–36.0)
MCV: 96.6 fL (ref 79.5–101.0)
MONO#: 0.4 10*3/uL (ref 0.1–0.9)
MONO%: 6 % (ref 0.0–14.0)
NEUT%: 14.9 % — ABNORMAL LOW (ref 38.4–76.8)
NEUTROS ABS: 1.1 10*3/uL — AB (ref 1.5–6.5)
Platelets: 166 10*3/uL (ref 145–400)
RBC: 4.5 10*6/uL (ref 3.70–5.45)
RDW: 15.5 % — ABNORMAL HIGH (ref 11.2–14.5)
WBC: 7.3 10*3/uL (ref 3.9–10.3)

## 2015-02-13 LAB — COMPREHENSIVE METABOLIC PANEL (CC13)
ALT: 22 U/L (ref 0–55)
ANION GAP: 9 meq/L (ref 3–11)
AST: 43 U/L — ABNORMAL HIGH (ref 5–34)
Albumin: 4.1 g/dL (ref 3.5–5.0)
Alkaline Phosphatase: 82 U/L (ref 40–150)
BUN: 12.5 mg/dL (ref 7.0–26.0)
CALCIUM: 10 mg/dL (ref 8.4–10.4)
CHLORIDE: 106 meq/L (ref 98–109)
CO2: 31 meq/L — AB (ref 22–29)
Creatinine: 0.9 mg/dL (ref 0.6–1.1)
EGFR: 75 mL/min/{1.73_m2} — ABNORMAL LOW (ref >90)
Glucose: 77 mg/dl (ref 70–140)
Potassium: 4.7 mEq/L (ref 3.5–5.1)
SODIUM: 146 meq/L — AB (ref 136–145)
Total Bilirubin: 0.65 mg/dL (ref 0.20–1.20)
Total Protein: 6.4 g/dL (ref 6.4–8.3)

## 2015-02-13 LAB — TECHNOLOGIST REVIEW

## 2015-02-13 LAB — LACTATE DEHYDROGENASE (CC13): LDH: 232 U/L (ref 125–245)

## 2015-02-13 NOTE — Telephone Encounter (Signed)
per pof to sch pt appt-LL sch ot not open for 2017-adv pt that we will call once sch opens-pt understood

## 2015-02-13 NOTE — Progress Notes (Signed)
OFFICE PROGRESS NOTE   February 13, 2015   Physicians:E.Griffin (D.Simonds), (D.Jacobs) Otelia Limes)  INTERVAL HISTORY:  Patient is seen, together with husband, for first time back to this MD since 02-2013, in interim having seen Dr Concha Norway at this office. She is followed for CLL as well as chronic leukopenia which preceded the CLL. Last imaging was CT CAP 02-2013, with mildly increased adenopathy thruout compared with scans 2009.  Patient tells me that she has been doing well, with last illness having been gastroenteritis in Feb 2015. Energy is generally good, no uncomfortable adenopathy, no fevers or sweats, appetite good, minimal discomfort occasionally at upper ventral hernia which contained only fat by last CT. No bleeding. No significant allergic sinus problems now. She alternates visits at this office and with Dr Laurann Montana every 6 months.   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 CLL diagnosis was made by biopsy of left cervical lymph node and bone marrow exam, CD20 positive by flow cytometry. She was treated for the CLL with Rituxan in 2005/ 2006 and again Jan 2008 thru Feb 2009, held then due to complaints of transient short term memory loss after each RItuxan treatment. She has previously had consultation with Dr Elon Alas  Review of systems as above, also: No SOB or chest pain. No skin rash now. Tends to be anxious. No LE swelling. No pain. Hair is thin. Remainder of 10 point Review of Systems negative.  Objective:  Vital signs in last 24 hours:  BP 138/77 mmHg  Pulse 76  Temp(Src) 97.7 F (36.5 C) (Oral)  Resp 18  Ht 5' (1.524 m)  Wt 135 lb (61.236 kg)  BMI 26.37 kg/m2 weight stable from a year ago  Alert, oriented and appropriate. Ambulatory without difficulty.    HEENT:PERRL, sclerae not icteric. Oral mucosa moist without  lesions, posterior pharynx clear.  Neck supple. No JVD.  Lymphatics:bilateral lower cervical nodes up to 1.5 cm, soft and not tender. No supraclavicular nodes. High right axillary nodes ~ 2 cm, soft and not tender. No clear inguinal adenopathy Resp: clear to auscultation bilaterally and normal percussion bilaterally Cardio: regular rate and rhythm. No gallop. GI: soft, nontender, not distended, no mass or organomegaly. Soft, slightly prominent area ~ 4 cm diameter just below epigastrium consistent with the ventral hernia, not tender. Normally active bowel sounds.  Musculoskeletal/ Extremities: without pitting edema, cords, tenderness Neuro: no peripheral neuropathy. Otherwise nonfocal. Psych appropriate mood and affect, not obviously anxious now Skin without rash, ecchymosis, petechiae Breasts: without dominant mass, skin or nipple findings. Axillae benign.   Lab Results:  Results for orders placed or performed in visit on 02/13/15  CBC with Differential  Result Value Ref Range   WBC 7.3 3.9 - 10.3 10e3/uL   NEUT# 1.1 (L) 1.5 - 6.5 10e3/uL   HGB 13.9 11.6 - 15.9 g/dL   HCT 43.5 34.8 - 46.6 %   Platelets 166 145 - 400 10e3/uL   MCV 96.6 79.5 - 101.0 fL   MCH 30.8 25.1 - 34.0 pg   MCHC 31.9 31.5 - 36.0 g/dL   RBC 4.50 3.70 - 5.45 10e6/uL   RDW 15.5 (H) 11.2 - 14.5 %   lymph# 5.5 (H) 0.9 - 3.3 10e3/uL   MONO# 0.4 0.1 - 0.9 10e3/uL   Eosinophils Absolute 0.2 0.0 - 0.5 10e3/uL   Basophils Absolute 0.1 0.0 -  0.1 10e3/uL   NEUT% 14.9 (L) 38.4 - 76.8 %   LYMPH% 75.7 (H) 14.0 - 49.7 %   MONO% 6.0 0.0 - 14.0 %   EOS% 2.6 0.0 - 7.0 %   BASO% 0.8 0.0 - 2.0 %  Comprehensive metabolic panel (Cmet) - [facility]  Result Value Ref Range   Sodium 146 (H) 136 - 145 mEq/L   Potassium 4.7 3.5 - 5.1 mEq/L   Chloride 106 98 - 109 mEq/L   CO2 31 (H) 22 - 29 mEq/L   Glucose 77 70 - 140 mg/dl   BUN 12.5 7.0 - 26.0 mg/dL   Creatinine 0.9 0.6 - 1.1 mg/dL   Total Bilirubin 0.65 0.20 - 1.20 mg/dL    Alkaline Phosphatase 82 40 - 150 U/L   AST 43 (H) 5 - 34 U/L   ALT 22 0 - 55 U/L   Total Protein 6.4 6.4 - 8.3 g/dL   Albumin 4.1 3.5 - 5.0 g/dL   Calcium 10.0 8.4 - 10.4 mg/dL   Anion Gap 9 3 - 11 mEq/L   EGFR 75 (L) >90 ml/min/1.73 m2     Studies/Results: Bilateral tomo mammography [facility] 11-28-14 with extremely dense breast tissue but otherwise no mammographic findings of concern (that report scanned into this EMR)   Medications: I have reviewed the patient's current medications.  DISCUSSION: clinically continues to do well, without findings that require intervention at this point for the CLL or chronic leukopenia.   Assessment/Plan: 1. CLL: clinically stable. I will see her back alternating visits with PCP ~ every 6 months. 2.chronic benign leukopenia stable 3.cutaneous sarcoid: not symptomatic now  4.environmental allergies and intolerances, including gluten 5.extremely dense breast tissue: 3D tomo mammograms most appropriate 6.degenerative changes in spine by CT 7.diverticular changes in colon by CT 8.small upper ventral hermia seems unchanged, contained fat only at last imaging.  Patient and husband followed discussion well and are in agreement with plans. She knows that she can call at any time prior to next scheduled visit if needed. Cc this note Dr Laurann Montana. Time spent 20 min including >50% counseling and coordination of care.  Ronneisha Jett P, MD   02/13/2015, 10:43 AM

## 2015-02-14 DIAGNOSIS — R922 Inconclusive mammogram: Secondary | ICD-10-CM | POA: Insufficient documentation

## 2015-03-25 DIAGNOSIS — J069 Acute upper respiratory infection, unspecified: Secondary | ICD-10-CM | POA: Diagnosis not present

## 2015-06-12 DIAGNOSIS — H524 Presbyopia: Secondary | ICD-10-CM | POA: Diagnosis not present

## 2015-06-12 DIAGNOSIS — H409 Unspecified glaucoma: Secondary | ICD-10-CM | POA: Diagnosis not present

## 2015-06-12 DIAGNOSIS — H2513 Age-related nuclear cataract, bilateral: Secondary | ICD-10-CM | POA: Diagnosis not present

## 2015-06-12 DIAGNOSIS — H4011X1 Primary open-angle glaucoma, mild stage: Secondary | ICD-10-CM | POA: Diagnosis not present

## 2015-06-23 ENCOUNTER — Other Ambulatory Visit: Payer: Self-pay

## 2015-08-14 DIAGNOSIS — H4011X1 Primary open-angle glaucoma, mild stage: Secondary | ICD-10-CM | POA: Diagnosis not present

## 2015-09-02 DIAGNOSIS — M810 Age-related osteoporosis without current pathological fracture: Secondary | ICD-10-CM | POA: Diagnosis not present

## 2015-09-02 DIAGNOSIS — Z1389 Encounter for screening for other disorder: Secondary | ICD-10-CM | POA: Diagnosis not present

## 2015-09-02 DIAGNOSIS — J301 Allergic rhinitis due to pollen: Secondary | ICD-10-CM | POA: Diagnosis not present

## 2015-09-02 DIAGNOSIS — C9110 Chronic lymphocytic leukemia of B-cell type not having achieved remission: Secondary | ICD-10-CM | POA: Diagnosis not present

## 2015-09-02 DIAGNOSIS — J4520 Mild intermittent asthma, uncomplicated: Secondary | ICD-10-CM | POA: Diagnosis not present

## 2015-09-02 DIAGNOSIS — E559 Vitamin D deficiency, unspecified: Secondary | ICD-10-CM | POA: Diagnosis not present

## 2015-09-02 DIAGNOSIS — E039 Hypothyroidism, unspecified: Secondary | ICD-10-CM | POA: Diagnosis not present

## 2015-09-02 DIAGNOSIS — Z0000 Encounter for general adult medical examination without abnormal findings: Secondary | ICD-10-CM | POA: Diagnosis not present

## 2015-09-02 DIAGNOSIS — Z131 Encounter for screening for diabetes mellitus: Secondary | ICD-10-CM | POA: Diagnosis not present

## 2015-09-03 ENCOUNTER — Telehealth: Payer: Self-pay | Admitting: Oncology

## 2015-09-03 NOTE — Telephone Encounter (Signed)
Faxed pt medical records to [facility] [place] °

## 2015-12-01 DIAGNOSIS — Z13820 Encounter for screening for osteoporosis: Secondary | ICD-10-CM | POA: Diagnosis not present

## 2015-12-01 DIAGNOSIS — Z1231 Encounter for screening mammogram for malignant neoplasm of breast: Secondary | ICD-10-CM | POA: Diagnosis not present

## 2015-12-01 DIAGNOSIS — M810 Age-related osteoporosis without current pathological fracture: Secondary | ICD-10-CM | POA: Diagnosis not present

## 2015-12-01 DIAGNOSIS — Z803 Family history of malignant neoplasm of breast: Secondary | ICD-10-CM | POA: Diagnosis not present

## 2015-12-04 ENCOUNTER — Encounter: Payer: Self-pay | Admitting: Oncology

## 2015-12-04 ENCOUNTER — Telehealth: Payer: Self-pay | Admitting: Oncology

## 2015-12-04 ENCOUNTER — Other Ambulatory Visit: Payer: Self-pay | Admitting: Oncology

## 2015-12-04 DIAGNOSIS — C9110 Chronic lymphocytic leukemia of B-cell type not having achieved remission: Secondary | ICD-10-CM

## 2015-12-04 NOTE — Progress Notes (Signed)
Medical Oncology  Reports from [facility] 12-01-15 received and will be screened into this EMR  Bilateral 3D screening mammogram: breast tissue extremely dense, no other mammographic findings of concern  Bone Density lowest spine with T score -2.7, osteoporotic   Godfrey Pick, MD

## 2015-12-04 NOTE — Telephone Encounter (Signed)
Called and left a message with follow up per pof  anne

## 2015-12-09 ENCOUNTER — Other Ambulatory Visit: Payer: Self-pay | Admitting: Physician Assistant

## 2015-12-09 ENCOUNTER — Ambulatory Visit
Admission: RE | Admit: 2015-12-09 | Discharge: 2015-12-09 | Disposition: A | Discharge status: Home / Self Care | Payer: Medicare Other | Source: Ambulatory Visit | Attending: Physician Assistant | Admitting: Physician Assistant

## 2015-12-09 DIAGNOSIS — C9110 Chronic lymphocytic leukemia of B-cell type not having achieved remission: Secondary | ICD-10-CM | POA: Diagnosis not present

## 2015-12-09 DIAGNOSIS — R059 Cough, unspecified: Secondary | ICD-10-CM

## 2015-12-09 DIAGNOSIS — R05 Cough: Secondary | ICD-10-CM | POA: Diagnosis not present

## 2016-02-03 ENCOUNTER — Other Ambulatory Visit: Payer: Self-pay | Admitting: Family Medicine

## 2016-02-03 ENCOUNTER — Ambulatory Visit
Admission: RE | Admit: 2016-02-03 | Discharge: 2016-02-03 | Disposition: A | Discharge status: Home / Self Care | Payer: Medicare Other | Source: Ambulatory Visit | Attending: Family Medicine | Admitting: Family Medicine

## 2016-02-03 DIAGNOSIS — J209 Acute bronchitis, unspecified: Secondary | ICD-10-CM | POA: Diagnosis not present

## 2016-02-03 DIAGNOSIS — R059 Cough, unspecified: Secondary | ICD-10-CM

## 2016-02-03 DIAGNOSIS — R05 Cough: Secondary | ICD-10-CM

## 2016-02-03 DIAGNOSIS — B349 Viral infection, unspecified: Secondary | ICD-10-CM | POA: Diagnosis not present

## 2016-02-11 ENCOUNTER — Other Ambulatory Visit: Payer: Self-pay | Admitting: Oncology

## 2016-02-12 ENCOUNTER — Telehealth: Payer: Self-pay | Admitting: Oncology

## 2016-02-12 ENCOUNTER — Encounter: Payer: Self-pay | Admitting: Oncology

## 2016-02-12 ENCOUNTER — Telehealth: Payer: Self-pay

## 2016-02-12 ENCOUNTER — Other Ambulatory Visit (HOSPITAL_BASED_OUTPATIENT_CLINIC_OR_DEPARTMENT_OTHER): Payer: Medicare Other

## 2016-02-12 ENCOUNTER — Other Ambulatory Visit: Payer: Self-pay | Admitting: *Deleted

## 2016-02-12 ENCOUNTER — Ambulatory Visit (HOSPITAL_BASED_OUTPATIENT_CLINIC_OR_DEPARTMENT_OTHER): Payer: Medicare Other | Admitting: Oncology

## 2016-02-12 VITALS — BP 148/60 | HR 84 | Temp 98.0°F | Resp 18 | Ht 60.0 in | Wt 132.3 lb

## 2016-02-12 DIAGNOSIS — D72819 Decreased white blood cell count, unspecified: Secondary | ICD-10-CM

## 2016-02-12 DIAGNOSIS — R922 Inconclusive mammogram: Secondary | ICD-10-CM | POA: Diagnosis not present

## 2016-02-12 DIAGNOSIS — J22 Unspecified acute lower respiratory infection: Secondary | ICD-10-CM

## 2016-02-12 DIAGNOSIS — C9110 Chronic lymphocytic leukemia of B-cell type not having achieved remission: Secondary | ICD-10-CM

## 2016-02-12 DIAGNOSIS — J069 Acute upper respiratory infection, unspecified: Secondary | ICD-10-CM

## 2016-02-12 DIAGNOSIS — Z9109 Other allergy status, other than to drugs and biological substances: Secondary | ICD-10-CM

## 2016-02-12 DIAGNOSIS — D863 Sarcoidosis of skin: Secondary | ICD-10-CM

## 2016-02-12 DIAGNOSIS — J209 Acute bronchitis, unspecified: Secondary | ICD-10-CM

## 2016-02-12 LAB — CBC WITH DIFFERENTIAL/PLATELET
BASO%: 0.3 % (ref 0.0–2.0)
BASOS ABS: 0 10*3/uL (ref 0.0–0.1)
EOS%: 3.1 % (ref 0.0–7.0)
Eosinophils Absolute: 0.3 10*3/uL (ref 0.0–0.5)
HEMATOCRIT: 41.9 % (ref 34.8–46.6)
HEMOGLOBIN: 13.7 g/dL (ref 11.6–15.9)
LYMPH#: 8.3 10*3/uL — AB (ref 0.9–3.3)
LYMPH%: 77.4 % — ABNORMAL HIGH (ref 14.0–49.7)
MCH: 31.5 pg (ref 25.1–34.0)
MCHC: 32.7 g/dL (ref 31.5–36.0)
MCV: 96.3 fL (ref 79.5–101.0)
MONO#: 0.4 10*3/uL (ref 0.1–0.9)
MONO%: 4 % (ref 0.0–14.0)
NEUT%: 15.2 % — ABNORMAL LOW (ref 38.4–76.8)
NEUTROS ABS: 1.6 10*3/uL (ref 1.5–6.5)
Platelets: 214 10*3/uL (ref 145–400)
RBC: 4.35 10*6/uL (ref 3.70–5.45)
RDW: 15.2 % — AB (ref 11.2–14.5)
WBC: 10.7 10*3/uL — ABNORMAL HIGH (ref 3.9–10.3)

## 2016-02-12 LAB — LACTATE DEHYDROGENASE: LDH: 241 U/L (ref 125–245)

## 2016-02-12 LAB — COMPREHENSIVE METABOLIC PANEL
ALBUMIN: 3.9 g/dL (ref 3.5–5.0)
ALK PHOS: 86 U/L (ref 40–150)
ALT: 21 U/L (ref 0–55)
AST: 40 U/L — AB (ref 5–34)
Anion Gap: 9 mEq/L (ref 3–11)
BILIRUBIN TOTAL: 0.55 mg/dL (ref 0.20–1.20)
BUN: 11 mg/dL (ref 7.0–26.0)
CO2: 28 mEq/L (ref 22–29)
Calcium: 9.5 mg/dL (ref 8.4–10.4)
Chloride: 106 mEq/L (ref 98–109)
Creatinine: 0.9 mg/dL (ref 0.6–1.1)
EGFR: 75 mL/min/{1.73_m2} — ABNORMAL LOW (ref >90)
GLUCOSE: 88 mg/dL (ref 70–140)
Potassium: 4.4 mEq/L (ref 3.5–5.1)
SODIUM: 143 meq/L (ref 136–145)
TOTAL PROTEIN: 6.6 g/dL (ref 6.4–8.3)

## 2016-02-12 MED ORDER — AZITHROMYCIN 250 MG PO TABS: ORAL_TABLET | ORAL | Status: DC

## 2016-02-12 NOTE — Telephone Encounter (Signed)
Appointments made and avs printed °

## 2016-02-12 NOTE — Telephone Encounter (Signed)
Taken care of already by [facility]

## 2016-02-12 NOTE — Progress Notes (Signed)
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 Last imaging was CT [facility] 02-2013. She has been on observation since Rituxan last used Feb 2009.  Patient reports respiratory illness x 2 weeks in late Nov 2016, and again for last 3 weeks upper and lower respiratory symptoms with cough, wheezing better with regular inhalers, temperature only up to 99, sinus congestion and drainage. Patient seen by PCP, recalls findings right lower chest ; symptoms improved but have not resolved with 5 days of azithromycin completed 02-07-16, particularly still cough productive of whitish sputum. No esophagitis, no flu-like aches. She is not aware of any change in adenopathy, denies bleeding, no GI or abdominal symptoms, no new or different pain, no sweats, appetite at baseline until this illness.  Remainder of 10 point Review of Systems negative /unchanged.    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 CLL diagnosis was made by biopsy of left cervical lymph node and bone marrow exam, CD20 positive by flow cytometry. She was treated for the CLL with Rituxan in 2005/ 2006 and again Jan 2008 thru Feb 2009, held then due to complaints of transient short term memory loss after each RItuxan treatment. She has previously had consultation with Dr Elon Alas   Objective:  Vital signs in last 24 hours:  BP 148/60 mmHg  Pulse 84  Temp(Src) 98 F (36.7 C) (Oral)  Resp 18  Ht 5' (1.524 m)  Wt 132 lb 4.8 oz (60.011 kg)  BMI 25.84 kg/m2  SpO2 100% Weight down 3 lbs, which she related to this illness. Sounds nasally congested, no cough during exam, respirations not labored with activity in exam room,  does not appear acutely ill. Alert, oriented and appropriate. Ambulatory without assistance.   HEENT:PERRL, sclerae not icteric. Oral mucosa moist without lesions, posterior pharynx dull erythema without exudate. Nasal turbinates boggy without purulence. Right TM a little dull, left fine. Neck supple. No JVD.  Lymphatics:soft lower cervical nodes up to 2-3 cm, no axillary or inguinal adenopathy Resp: crackles right base, no wheezing, no dullness to percussion, no use of accessory muscles Cardio: regular rate and rhythm. No gallop. GI: soft, nontender, not distended, no mass or organomegaly. Normally active bowel sounds.  Musculoskeletal/ Extremities: without pitting edema, cords, tenderness Neuro: no peripheral neuropathy. Otherwise nonfocal Skin without rash, ecchymosis, petechiae Breasts: bilaterally without dominant mass, skin or nipple findings. Axillae benign.   Lab Results:  Results for orders placed or performed in visit on 02/12/16  CBC with Differential  Result Value Ref Range   WBC 10.7 (H) 3.9 - 10.3 10e3/uL   NEUT# 1.6 1.5 - 6.5 10e3/uL   HGB 13.7 11.6 - 15.9 g/dL   HCT 41.9 34.8 - 46.6 %   Platelets 214 145 - 400 10e3/uL   MCV 96.3 79.5 - 101.0 fL   MCH 31.5 25.1 - 34.0 pg   MCHC 32.7 31.5 - 36.0 g/dL   RBC 4.35 3.70 - 5.45 10e6/uL   RDW 15.2 (H) 11.2 - 14.5 %   lymph# 8.3 (H) 0.9 - 3.3 10e3/uL   MONO# 0.4 0.1 - 0.9 10e3/uL   Eosinophils Absolute 0.3 0.0 - 0.5 10e3/uL   Basophils Absolute 0.0 0.0 -  0.1 10e3/uL   NEUT% 15.2 (L) 38.4 - 76.8 %   LYMPH% 77.4 (H) 14.0 - 49.7 %   MONO% 4.0 0.0 - 14.0 %   EOS% 3.1 0.0 - 7.0 %   BASO% 0.3 0.0 - 2.0 %  Lactate dehydrogenase (LDH) - [facility]  Result Value Ref Range   LDH 241 125 - 245 U/L  Comprehensive metabolic panel  Result Value Ref Range   Sodium 143 136 - 145 mEq/L   Potassium 4.4 3.5 - 5.1 mEq/L   Chloride 106 98 - 109 mEq/L   CO2 28 22 - 29 mEq/L   Glucose 88 70 - 140 mg/dl   BUN 11.0 7.0 - 26.0 mg/dL    Creatinine 0.9 0.6 - 1.1 mg/dL   Total Bilirubin 0.55 0.20 - 1.20 mg/dL   Alkaline Phosphatase 86 40 - 150 U/L   AST 40 (H) 5 - 34 U/L   ALT 21 0 - 55 U/L   Total Protein 6.6 6.4 - 8.3 g/dL   Albumin 3.9 3.5 - 5.0 g/dL   Calcium 9.5 8.4 - 10.4 mg/dL   Anion Gap 9 3 - 11 mEq/L   EGFR 75 (L) >90 ml/min/1.73 m2     Studies/Results:   Bilateral tomo mammograms [facility] 12-01-15 extremely dense breast tissue otherwise no mammographic findings of concern  DEXA [facility] 12-01-15 osteoporosis, 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    Medications: I have reviewed the patient's current medications. On fosamax. Additional 5 days azithromycin, script to [facility]  DISCUSSION Patient to let MD know if respiratory symptoms do not resolve entirely with the additional 5 days azithromycin. Add mucinex. With 2 respiratory infections in last 2-3 months, will check quantitative immune globulins with other labs upcoming.   Assessment/Plan:  1. CLL: recent respiratory infections, including at least bronchitis now. Will see again in ~ 2-3 months, check quantitative immune globulins prior to that viist.  2.chronic benign leukopenia stable. WBC actually highest she has had in several years, with the respiratory infection. 3.upper and lower respiratory infection with crackles RLL lung. Additional 5 days azithromycin, + mucinex. 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 Intolerance to flu vaccine: symptoms do not suggest influenza. Husband had had flu vaccine. Reminded her of tamiflu  All questions answered. Time spent 25 min including >50% counseling and coordination of care.cc Dr  Cherlyn Cushing, MD   02/14/2016, 4:48 PM

## 2016-02-12 NOTE — Telephone Encounter (Signed)
-----   Message from Gordy Levan, MD sent at 02/12/2016 10:51 AM EST ----- Azithromycin Z pack to [facility] please

## 2016-02-14 DIAGNOSIS — Z9109 Other allergy status, other than to drugs and biological substances: Secondary | ICD-10-CM | POA: Insufficient documentation

## 2016-02-19 DIAGNOSIS — H401131 Primary open-angle glaucoma, bilateral, mild stage: Secondary | ICD-10-CM | POA: Diagnosis not present

## 2016-02-19 DIAGNOSIS — H04123 Dry eye syndrome of bilateral lacrimal glands: Secondary | ICD-10-CM | POA: Diagnosis not present

## 2016-02-19 DIAGNOSIS — H25013 Cortical age-related cataract, bilateral: Secondary | ICD-10-CM | POA: Diagnosis not present

## 2016-02-19 DIAGNOSIS — H2513 Age-related nuclear cataract, bilateral: Secondary | ICD-10-CM | POA: Diagnosis not present

## 2016-04-28 DIAGNOSIS — H401121 Primary open-angle glaucoma, left eye, mild stage: Secondary | ICD-10-CM | POA: Diagnosis not present

## 2016-04-28 DIAGNOSIS — H2512 Age-related nuclear cataract, left eye: Secondary | ICD-10-CM | POA: Diagnosis not present

## 2016-04-28 DIAGNOSIS — H25012 Cortical age-related cataract, left eye: Secondary | ICD-10-CM | POA: Diagnosis not present

## 2016-04-28 DIAGNOSIS — H2511 Age-related nuclear cataract, right eye: Secondary | ICD-10-CM | POA: Diagnosis not present

## 2016-04-28 DIAGNOSIS — H401111 Primary open-angle glaucoma, right eye, mild stage: Secondary | ICD-10-CM | POA: Diagnosis not present

## 2016-05-03 ENCOUNTER — Other Ambulatory Visit (HOSPITAL_BASED_OUTPATIENT_CLINIC_OR_DEPARTMENT_OTHER): Payer: Medicare Other

## 2016-05-03 DIAGNOSIS — C9110 Chronic lymphocytic leukemia of B-cell type not having achieved remission: Secondary | ICD-10-CM | POA: Diagnosis not present

## 2016-05-03 LAB — CBC WITH DIFFERENTIAL/PLATELET
BASO%: 0.3 % (ref 0.0–2.0)
BASOS ABS: 0 10*3/uL (ref 0.0–0.1)
EOS ABS: 0.2 10*3/uL (ref 0.0–0.5)
EOS%: 1.5 % (ref 0.0–7.0)
HEMATOCRIT: 42 % (ref 34.8–46.6)
HEMOGLOBIN: 13.6 g/dL (ref 11.6–15.9)
LYMPH%: 85.1 % — ABNORMAL HIGH (ref 14.0–49.7)
MCH: 31 pg (ref 25.1–34.0)
MCHC: 32.3 g/dL (ref 31.5–36.0)
MCV: 96.1 fL (ref 79.5–101.0)
MONO#: 0.5 10*3/uL (ref 0.1–0.9)
MONO%: 3.6 % (ref 0.0–14.0)
NEUT%: 9.5 % — ABNORMAL LOW (ref 38.4–76.8)
NEUTROS ABS: 1.3 10*3/uL — AB (ref 1.5–6.5)
PLATELETS: 153 10*3/uL (ref 145–400)
RBC: 4.37 10*6/uL (ref 3.70–5.45)
RDW: 15.9 % — AB (ref 11.2–14.5)
WBC: 13.7 10*3/uL — AB (ref 3.9–10.3)
lymph#: 11.7 10*3/uL — ABNORMAL HIGH (ref 0.9–3.3)

## 2016-05-03 LAB — TECHNOLOGIST REVIEW

## 2016-05-04 DIAGNOSIS — H2512 Age-related nuclear cataract, left eye: Secondary | ICD-10-CM | POA: Diagnosis not present

## 2016-05-04 LAB — IGG, IGA, IGM
IGA/IMMUNOGLOBULIN A, SERUM: 62 mg/dL — AB (ref 64–422)
IGG (IMMUNOGLOBIN G), SERUM: 408 mg/dL — AB (ref 700–1600)
IgM, Qn, Serum: 13 mg/dL — ABNORMAL LOW (ref 26–217)

## 2016-05-09 ENCOUNTER — Other Ambulatory Visit: Payer: Self-pay | Admitting: Oncology

## 2016-05-10 ENCOUNTER — Ambulatory Visit (HOSPITAL_BASED_OUTPATIENT_CLINIC_OR_DEPARTMENT_OTHER): Payer: Medicare Other | Admitting: Oncology

## 2016-05-10 ENCOUNTER — Telehealth: Payer: Self-pay | Admitting: Oncology

## 2016-05-10 VITALS — BP 135/69 | HR 83 | Temp 98.3°F | Resp 18 | Ht 60.0 in | Wt 129.4 lb

## 2016-05-10 DIAGNOSIS — D899 Disorder involving the immune mechanism, unspecified: Secondary | ICD-10-CM

## 2016-05-10 DIAGNOSIS — R634 Abnormal weight loss: Secondary | ICD-10-CM

## 2016-05-10 DIAGNOSIS — R922 Inconclusive mammogram: Secondary | ICD-10-CM

## 2016-05-10 DIAGNOSIS — R768 Other specified abnormal immunological findings in serum: Secondary | ICD-10-CM

## 2016-05-10 DIAGNOSIS — C9110 Chronic lymphocytic leukemia of B-cell type not having achieved remission: Secondary | ICD-10-CM | POA: Diagnosis not present

## 2016-05-10 DIAGNOSIS — D72819 Decreased white blood cell count, unspecified: Secondary | ICD-10-CM

## 2016-05-10 DIAGNOSIS — D849 Immunodeficiency, unspecified: Secondary | ICD-10-CM

## 2016-05-10 NOTE — Telephone Encounter (Signed)
appt made and avs printed °

## 2016-05-10 NOTE — Progress Notes (Signed)
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 She has chronic leukopenia which preceded the CLL.  Patient has had no illness since respiratory infections 10-2015 and Feb 2017, which did not require hospitalization tho she was treated with Zpak in 01-2016. She has felt very well, "the best that I have felt", which she associates with line dancing 2x weekly for an hour each time for the past year, very intensive exercise. She has lost a few lbs since she was here last, however is eating mostly fruits and vegetables, some baked chicken, nothing with more calories. Pain in hips and LE is gone since she has been exercising regularly. She denies fever, night sweats, painful adenopathy, other pain, SOB, change in bowels, N/V, any bleeding, swelling LE, bladder symptoms.  Left cataract removed last week, no problems and vision better. 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 CLL diagnosis was made by biopsy of left cervical lymph node and bone marrow exam, CD20 positive by flow cytometry. She was treated for the CLL with Rituxan in 2005/ 2006 and again Jan 2008 thru Feb 2009, held then due to complaints of transient short term memory loss after each RItuxan treatment. She has previously had consultation with Dr Elon Alas    Objective:  Vital signs in last 24 hours:  BP 135/69 mmHg  Pulse 83  Temp(Src) 98.3 F (36.8 C) (Oral)  Resp 18  Ht 5' (1.524 m)  Wt 129 lb 6.4 oz (58.695 kg)  BMI 25.27 kg/m2  SpO2 100% Weight had been 132 lbs  in 01-2016 and 135 lbs in 01-2015. Alert, oriented and appropriate. Ambulatory without difficulty, easily mobile   HEENT:PERRL, sclerae not icteric. Oral mucosa moist without lesions, posterior pharynx clear.  Neck supple. No JVD.  Lymphatics: soft left lower cervical adenopathy > right, nodes up to 1-2 cm. Small right axillary nodes, soft. Resp: clear to auscultation bilaterally and normal percussion bilaterally Cardio: regular rate and rhythm. No gallop. GI: soft, nontender, not distended, no mass or organomegaly. Normally active bowel sounds Musculoskeletal/ Extremities: without pitting edema, cords, tenderness Neuro: no peripheral neuropathy. Otherwise nonfocal. PSYCH appropriate mood and affect Skin without rash, ecchymosis, petechiae Breasts: without dominant mass, skin or nipple findings. Axillae benign.  Lab Results:  Results for orders placed or performed in visit on 05/03/16  TECHNOLOGIST REVIEW  Result Value Ref Range   Technologist Review Variant lymphs present   CBC with Differential  Result Value Ref Range   WBC 13.7 (H) 3.9 - 10.3 10e3/uL   NEUT# 1.3 (L) 1.5 - 6.5 10e3/uL   HGB 13.6 11.6 - 15.9 g/dL   HCT 42.0 34.8 - 46.6 %   Platelets 153 145 - 400 10e3/uL   MCV 96.1 79.5 - 101.0 fL   MCH 31.0 25.1 - 34.0 pg   MCHC 32.3 31.5 - 36.0 g/dL   RBC 4.37 3.70 - 5.45 10e6/uL   RDW 15.9 (H) 11.2 - 14.5 %   lymph# 11.7 (H) 0.9 - 3.3 10e3/uL   MONO# 0.5 0.1 - 0.9 10e3/uL  Eosinophils Absolute 0.2 0.0 - 0.5 10e3/uL   Basophils Absolute 0.0 0.0 - 0.1 10e3/uL   NEUT% 9.5 (L) 38.4 - 76.8 %   LYMPH% 85.1 (H) 14.0 - 49.7 %   MONO% 3.6 0.0 - 14.0 %   EOS% 1.5 0.0 - 7.0 %   BASO% 0.3 0.0 - 2.0 %  QIG  (Quant. immunoglobulins  - IgG, IgA, IgM)  Result Value Ref Range   IgG, Qn, Serum 408 (L) 700 - 1600 mg/dL   IgA, Qn, Serum 62 (L) 64 - 422 mg/dL   IgM, Qn, Serum 13 (L) 26 - 217 mg/dL     Studies/Results:  No results found.  Medications: I have reviewed the patient's  current medications.  DISCUSSION Regular good exercise without increase in calories seems cause of gradual weight loss, husband agrees. Suggested that she may like some organic supplement drinks.   No recurrent infections even with low QIG as noted. No other concerns from standpoint of the B cell CLL.  Assessment/Plan:  1. CLL: good response previously to Rituxan but concerns about cognitive effects. Clinically stable, continue observation. Low immunoglobulins noted, has not had IVIG previously but could consider this if more infectious problems 2.chronic benign leukopenia stable. 3.gradual weight loss in last year; not 10 lbs by our records. 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 Intolerance to flu vaccine   All questions answered, patient and husband in agreement with recommendations and plans. I will see her in ~ 3 months to be sure stable. Time spent 20 min including >50% counseling and coordination of care. Route PCP   Gordy Levan, MD   05/10/2016, 10:36 AM

## 2016-05-16 ENCOUNTER — Encounter: Payer: Self-pay | Admitting: Oncology

## 2016-05-16 DIAGNOSIS — D899 Disorder involving the immune mechanism, unspecified: Secondary | ICD-10-CM

## 2016-05-16 DIAGNOSIS — R768 Other specified abnormal immunological findings in serum: Secondary | ICD-10-CM | POA: Insufficient documentation

## 2016-05-16 DIAGNOSIS — D849 Immunodeficiency, unspecified: Secondary | ICD-10-CM | POA: Insufficient documentation

## 2016-05-27 DIAGNOSIS — H25011 Cortical age-related cataract, right eye: Secondary | ICD-10-CM | POA: Diagnosis not present

## 2016-05-27 DIAGNOSIS — H2511 Age-related nuclear cataract, right eye: Secondary | ICD-10-CM | POA: Diagnosis not present

## 2016-06-01 DIAGNOSIS — H2511 Age-related nuclear cataract, right eye: Secondary | ICD-10-CM | POA: Diagnosis not present

## 2016-07-04 ENCOUNTER — Other Ambulatory Visit: Payer: Self-pay | Admitting: Oncology

## 2016-07-05 ENCOUNTER — Telehealth: Payer: Self-pay | Admitting: Oncology

## 2016-07-05 ENCOUNTER — Other Ambulatory Visit (HOSPITAL_BASED_OUTPATIENT_CLINIC_OR_DEPARTMENT_OTHER): Payer: Medicare Other

## 2016-07-05 ENCOUNTER — Ambulatory Visit (HOSPITAL_BASED_OUTPATIENT_CLINIC_OR_DEPARTMENT_OTHER): Payer: Medicare Other | Admitting: Oncology

## 2016-07-05 ENCOUNTER — Encounter: Payer: Self-pay | Admitting: Oncology

## 2016-07-05 VITALS — BP 117/97 | HR 75 | Temp 98.3°F | Resp 18 | Ht 60.0 in | Wt 127.0 lb

## 2016-07-05 DIAGNOSIS — R21 Rash and other nonspecific skin eruption: Secondary | ICD-10-CM | POA: Diagnosis not present

## 2016-07-05 DIAGNOSIS — R5383 Other fatigue: Secondary | ICD-10-CM | POA: Diagnosis not present

## 2016-07-05 DIAGNOSIS — R922 Inconclusive mammogram: Secondary | ICD-10-CM

## 2016-07-05 DIAGNOSIS — C9110 Chronic lymphocytic leukemia of B-cell type not having achieved remission: Secondary | ICD-10-CM

## 2016-07-05 DIAGNOSIS — R634 Abnormal weight loss: Secondary | ICD-10-CM

## 2016-07-05 DIAGNOSIS — R768 Other specified abnormal immunological findings in serum: Secondary | ICD-10-CM

## 2016-07-05 DIAGNOSIS — D72819 Decreased white blood cell count, unspecified: Secondary | ICD-10-CM

## 2016-07-05 LAB — CBC WITH DIFFERENTIAL/PLATELET
BASO%: 0.2 % (ref 0.0–2.0)
Basophils Absolute: 0 10*3/uL (ref 0.0–0.1)
EOS ABS: 0.3 10*3/uL (ref 0.0–0.5)
EOS%: 1.9 % (ref 0.0–7.0)
HEMATOCRIT: 40.4 % (ref 34.8–46.6)
HEMOGLOBIN: 13.1 g/dL (ref 11.6–15.9)
LYMPH#: 12 10*3/uL — AB (ref 0.9–3.3)
LYMPH%: 85.8 % — AB (ref 14.0–49.7)
MCH: 31.2 pg (ref 25.1–34.0)
MCHC: 32.4 g/dL (ref 31.5–36.0)
MCV: 96.3 fL (ref 79.5–101.0)
MONO#: 0.5 10*3/uL (ref 0.1–0.9)
MONO%: 3.4 % (ref 0.0–14.0)
NEUT%: 8.7 % — ABNORMAL LOW (ref 38.4–76.8)
NEUTROS ABS: 1.2 10*3/uL — AB (ref 1.5–6.5)
PLATELETS: 129 10*3/uL — AB (ref 145–400)
RBC: 4.2 10*6/uL (ref 3.70–5.45)
RDW: 15.7 % — ABNORMAL HIGH (ref 11.2–14.5)
WBC: 13.9 10*3/uL — AB (ref 3.9–10.3)

## 2016-07-05 LAB — COMPREHENSIVE METABOLIC PANEL
ALK PHOS: 80 U/L (ref 40–150)
ALT: 26 U/L (ref 0–55)
AST: 53 U/L — ABNORMAL HIGH (ref 5–34)
Albumin: 3.9 g/dL (ref 3.5–5.0)
Anion Gap: 10 mEq/L (ref 3–11)
BUN: 16.8 mg/dL (ref 7.0–26.0)
CHLORIDE: 107 meq/L (ref 98–109)
CO2: 26 meq/L (ref 22–29)
Calcium: 9.4 mg/dL (ref 8.4–10.4)
Creatinine: 0.9 mg/dL (ref 0.6–1.1)
EGFR: 71 mL/min/{1.73_m2} — ABNORMAL LOW (ref >90)
GLUCOSE: 80 mg/dL (ref 70–140)
POTASSIUM: 4 meq/L (ref 3.5–5.1)
Sodium: 143 mEq/L (ref 136–145)
TOTAL PROTEIN: 6.3 g/dL — AB (ref 6.4–8.3)
Total Bilirubin: 0.59 mg/dL (ref 0.20–1.20)

## 2016-07-05 LAB — TECHNOLOGIST REVIEW

## 2016-07-05 NOTE — Progress Notes (Signed)
OFFICE PROGRESS NOTE   July 07, 2016   Physicians: E.Griffin (D.Simonds), (D.Jacobs) Otelia Limes)  INTERVAL HISTORY:   Patient is seen, together with husband, in follow up of weight loss concerns, in setting of CLL and chronic leukopenia. She has been on observation for the CLL since difficulty with Rituxan 2009. Weight loss seems related to diet and increased exercise.  Patient has had no fever or symptoms of infection, no increase in adenopathy, no sweats, no bleeding or unusual bruising. Husband is now eating same diet in attempt to come off of diabetic medication, this primarily fruits, vegetables, broiled meat and oatmeal with lots of water; husband has also lost some weight. She does not tolerate Ensure etc, was able to tolerate Orgain but did not care for it. Patient did not do usual 2x weekly line dancing for a couple of weeks as she had bilateral cataract surgery, uncomplicated and helpful. She felt more stiff when she was not exercising. She resumed the line dancing 2 weeks ago.  Patient tells me that she has been back on Fosamax x 6 months by Dr Laurann Montana. As with previous oral bisphosphonates, she feels that she is generally fatigued related to that medication, worse just after the weekly dose and better towards end of week. She also notices decreased appetite and some nonpruritic rash at lips similar to symptoms with prior oral bisphosphonates. Review of drug literature reports <1% malaise, skin rash, photosensitivity. This rash is different from previous cutaneous sarcoid involving periorbital areas.  Remainder of 10 point Review of Systems negative.   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 CLL diagnosis was made by biopsy of left cervical lymph node and bone marrow exam, CD20 positive by flow cytometry. She was treated for the CLL with  Rituxan in 2005/ 2006 and again Jan 2008 thru Feb 2009, held then due to complaints of transient short term memory loss after each RItuxan treatment. She previously had consultation with Dr Elon Alas, diagnoses as above.  Objective:  Vital signs in last 24 hours:  BP 117/97 mmHg  Pulse 75  Temp(Src) 98.3 F (36.8 C) (Oral)  Resp 18  Ht 5' (1.524 m)  Wt 127 lb (57.607 kg)  BMI 24.80 kg/m2  SpO2 100% Weight down 2 lbs from 04-2016 Alert, oriented and appropriate. Ambulatory without difficulty. Respirations not labored. Looks comfortable  HEENT:PERRL, sclerae not icteric. Oral mucosa moist without lesions, posterior pharynx clear.  Neck supple. No JVD.  Lymphatics: small soft cervical adenopathy without change Resp: clear to auscultation bilaterally and normal percussion bilaterally Cardio: regular rate and rhythm. No gallop. GI: soft, nontender, not distended, no mass or palpable organomegaly. Normally active bowel sounds.  Musculoskeletal/ Extremities: without pitting edema, cords, tenderness Neuro: no peripheral neuropathy. Otherwise nonfocal. PSYCH appropriate mood and affect Skin:slight papular rash at upper lip, not erythematous, not vesicular. Otherwise without rash, ecchymosis, petechiae and no skin changes periorbital now   Lab Results:  Results for orders placed or performed in visit on 07/05/16  TECHNOLOGIST REVIEW  Result Value Ref Range   Technologist Review Variant lymphs present, Oc smudge cell   CBC with Differential  Result Value Ref Range   WBC 13.9 (H) 3.9 - 10.3 10e3/uL   NEUT# 1.2 (L) 1.5 - 6.5 10e3/uL   HGB 13.1 11.6 - 15.9 g/dL   HCT 40.4 34.8 - 46.6 %  Platelets 129 (L) 145 - 400 10e3/uL   MCV 96.3 79.5 - 101.0 fL   MCH 31.2 25.1 - 34.0 pg   MCHC 32.4 31.5 - 36.0 g/dL   RBC 4.20 3.70 - 5.45 10e6/uL   RDW 15.7 (H) 11.2 - 14.5 %   lymph# 12.0 (H) 0.9 - 3.3 10e3/uL   MONO# 0.5 0.1 - 0.9 10e3/uL   Eosinophils Absolute 0.3 0.0 - 0.5 10e3/uL    Basophils Absolute 0.0 0.0 - 0.1 10e3/uL   NEUT% 8.7 (L) 38.4 - 76.8 %   LYMPH% 85.8 (H) 14.0 - 49.7 %   MONO% 3.4 0.0 - 14.0 %   EOS% 1.9 0.0 - 7.0 %   BASO% 0.2 0.0 - 2.0 %  Comprehensive metabolic panel  Result Value Ref Range   Sodium 143 136 - 145 mEq/L   Potassium 4.0 3.5 - 5.1 mEq/L   Chloride 107 98 - 109 mEq/L   CO2 26 22 - 29 mEq/L   Glucose 80 70 - 140 mg/dl   BUN 16.8 7.0 - 26.0 mg/dL   Creatinine 0.9 0.6 - 1.1 mg/dL   Total Bilirubin 0.59 0.20 - 1.20 mg/dL   Alkaline Phosphatase 80 40 - 150 U/L   AST 53 (H) 5 - 34 U/L   ALT 26 0 - 55 U/L   Total Protein 6.3 (L) 6.4 - 8.3 g/dL   Albumin 3.9 3.5 - 5.0 g/dL   Calcium 9.4 8.4 - 10.4 mg/dL   Anion Gap 10 3 - 11 mEq/L   EGFR 71 (L) >90 ml/min/1.73 m2     Studies/Results:  No results found. Tomo mammograms [facility] 12-01-15, extremely dense breast tissue  Medications: I have reviewed the patient's current medications. I have asked her to stop Fosamax from now until she sees Dr Laurann Montana in Sept.  DISCUSSION Weight loss related to very healthy diet with low calories and this excellent weight bearing exercise begun in last several months. Recommended that she should continue this exercise as she is holding Fosamax. BMI just in normal range today at 24.9, with ideal BMI 18.5 - 24.9 and no symptoms suggesting this weight loss is of concern. Note husband also losing some weight as he is now eating same as Mrs Passmore.  Platelets are a little lower today, asymptomatic and nothing on exam, may be just variation but will ask Dr Laurann Montana to repeat CBC at her visit in ~ Sept.   Assessment/Plan:  1. CLL: good response previously to Rituxan but concerns about cognitive effects. Clinically stable, continue observation. Low immunoglobulins noted, has not had IVIG previously but could consider this if more infectious problems. Platelets a little lower today unclear significance, will ask Dr Laurann Montana to repeat CBC at her visit in ~ 2  months 2.chronic benign leukopenia stable. 3.gradual weight loss now at upper normal range BMI, related to diet and regular good exercise.  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 Intolerance to flu vaccine 11. Fatigue and slight skin rash which patient feels are from Fosamax, similar problems with other oral bisphosphonates per her history. I have asked her to hold Fosamax until she sees Dr Laurann Montana, and to continue good weight bearing exercise. 12.recent bilateral cataract extractions: good results, no complications.  All  questions answered and she knows to call prior to next scheduled appointment if needed. Time spent 25 min including >50% counseling and coordination of care. CC Dr Laurann Montana with request for CBC at her visit.    Evlyn Clines, MD   07/07/2016, 8:19 PM

## 2016-07-05 NOTE — Telephone Encounter (Signed)
appt made and avs printed °

## 2016-08-05 ENCOUNTER — Ambulatory Visit: Payer: Medicare Other | Admitting: Oncology

## 2016-08-05 ENCOUNTER — Other Ambulatory Visit: Payer: Medicare Other

## 2016-09-07 DIAGNOSIS — J301 Allergic rhinitis due to pollen: Secondary | ICD-10-CM | POA: Diagnosis not present

## 2016-09-07 DIAGNOSIS — E039 Hypothyroidism, unspecified: Secondary | ICD-10-CM | POA: Diagnosis not present

## 2016-09-07 DIAGNOSIS — Z0000 Encounter for general adult medical examination without abnormal findings: Secondary | ICD-10-CM | POA: Diagnosis not present

## 2016-09-07 DIAGNOSIS — J4520 Mild intermittent asthma, uncomplicated: Secondary | ICD-10-CM | POA: Diagnosis not present

## 2016-09-07 DIAGNOSIS — C9110 Chronic lymphocytic leukemia of B-cell type not having achieved remission: Secondary | ICD-10-CM | POA: Diagnosis not present

## 2016-09-07 DIAGNOSIS — Z1389 Encounter for screening for other disorder: Secondary | ICD-10-CM | POA: Diagnosis not present

## 2016-09-07 DIAGNOSIS — R634 Abnormal weight loss: Secondary | ICD-10-CM | POA: Diagnosis not present

## 2016-09-07 DIAGNOSIS — E559 Vitamin D deficiency, unspecified: Secondary | ICD-10-CM | POA: Diagnosis not present

## 2016-09-07 DIAGNOSIS — M810 Age-related osteoporosis without current pathological fracture: Secondary | ICD-10-CM | POA: Diagnosis not present

## 2016-09-10 DIAGNOSIS — B349 Viral infection, unspecified: Secondary | ICD-10-CM | POA: Diagnosis not present

## 2016-09-23 DIAGNOSIS — R05 Cough: Secondary | ICD-10-CM | POA: Diagnosis not present

## 2016-10-14 DIAGNOSIS — H04213 Epiphora due to excess lacrimation, bilateral lacrimal glands: Secondary | ICD-10-CM | POA: Diagnosis not present

## 2016-10-14 DIAGNOSIS — H401131 Primary open-angle glaucoma, bilateral, mild stage: Secondary | ICD-10-CM | POA: Diagnosis not present

## 2016-10-14 DIAGNOSIS — H04223 Epiphora due to insufficient drainage, bilateral lacrimal glands: Secondary | ICD-10-CM | POA: Diagnosis not present

## 2016-11-04 ENCOUNTER — Telehealth: Payer: Self-pay | Admitting: *Deleted

## 2016-11-04 NOTE — Telephone Encounter (Signed)
"  This is Anderson Malta with [facility] calling about this research patient.  Requested records most recently on 10-14-2016 and on 07-12-2016.  Has the request been received."  Call transferred to ext [phone].

## 2016-11-07 ENCOUNTER — Other Ambulatory Visit: Payer: Self-pay | Admitting: Oncology

## 2016-11-08 ENCOUNTER — Other Ambulatory Visit: Payer: Self-pay

## 2016-11-08 ENCOUNTER — Ambulatory Visit (HOSPITAL_BASED_OUTPATIENT_CLINIC_OR_DEPARTMENT_OTHER): Payer: Medicare Other | Admitting: Oncology

## 2016-11-08 ENCOUNTER — Ambulatory Visit (HOSPITAL_BASED_OUTPATIENT_CLINIC_OR_DEPARTMENT_OTHER): Payer: Medicare Other

## 2016-11-08 VITALS — BP 114/40 | HR 87 | Temp 98.3°F | Resp 14 | Ht 60.0 in | Wt 118.7 lb

## 2016-11-08 DIAGNOSIS — R6881 Early satiety: Secondary | ICD-10-CM | POA: Diagnosis not present

## 2016-11-08 DIAGNOSIS — R110 Nausea: Secondary | ICD-10-CM | POA: Diagnosis not present

## 2016-11-08 DIAGNOSIS — D72819 Decreased white blood cell count, unspecified: Secondary | ICD-10-CM | POA: Diagnosis not present

## 2016-11-08 DIAGNOSIS — C9110 Chronic lymphocytic leukemia of B-cell type not having achieved remission: Secondary | ICD-10-CM

## 2016-11-08 DIAGNOSIS — R634 Abnormal weight loss: Secondary | ICD-10-CM | POA: Diagnosis not present

## 2016-11-08 DIAGNOSIS — Z9109 Other allergy status, other than to drugs and biological substances: Secondary | ICD-10-CM

## 2016-11-08 LAB — COMPREHENSIVE METABOLIC PANEL
ALBUMIN: 3.3 g/dL — AB (ref 3.5–5.0)
ALK PHOS: 107 U/L (ref 40–150)
ALT: 16 U/L (ref 0–55)
AST: 42 U/L — AB (ref 5–34)
Anion Gap: 9 mEq/L (ref 3–11)
BILIRUBIN TOTAL: 0.52 mg/dL (ref 0.20–1.20)
BUN: 14.6 mg/dL (ref 7.0–26.0)
CALCIUM: 9.6 mg/dL (ref 8.4–10.4)
CO2: 27 mEq/L (ref 22–29)
CREATININE: 0.8 mg/dL (ref 0.6–1.1)
Chloride: 105 mEq/L (ref 98–109)
EGFR: 80 mL/min/{1.73_m2} — ABNORMAL LOW (ref >90)
Glucose: 96 mg/dl (ref 70–140)
Potassium: 4.5 mEq/L (ref 3.5–5.1)
Sodium: 141 mEq/L (ref 136–145)
Total Protein: 6.2 g/dL — ABNORMAL LOW (ref 6.4–8.3)

## 2016-11-08 LAB — CBC WITH DIFFERENTIAL/PLATELET
BASO%: 0.2 % (ref 0.0–2.0)
BASOS ABS: 0 10*3/uL (ref 0.0–0.1)
EOS ABS: 0.3 10*3/uL (ref 0.0–0.5)
EOS%: 1.5 % (ref 0.0–7.0)
HCT: 39.6 % (ref 34.8–46.6)
HGB: 13.1 g/dL (ref 11.6–15.9)
LYMPH%: 83.4 % — AB (ref 14.0–49.7)
MCH: 32.2 pg (ref 25.1–34.0)
MCHC: 33.1 g/dL (ref 31.5–36.0)
MCV: 97.3 fL (ref 79.5–101.0)
MONO#: 0.7 10*3/uL (ref 0.1–0.9)
MONO%: 4.1 % (ref 0.0–14.0)
NEUT%: 10.8 % — ABNORMAL LOW (ref 38.4–76.8)
NEUTROS ABS: 1.8 10*3/uL (ref 1.5–6.5)
PLATELETS: 166 10*3/uL (ref 145–400)
RBC: 4.07 10*6/uL (ref 3.70–5.45)
RDW: 15.8 % — ABNORMAL HIGH (ref 11.2–14.5)
WBC: 16.4 10*3/uL — AB (ref 3.9–10.3)
lymph#: 13.7 10*3/uL — ABNORMAL HIGH (ref 0.9–3.3)

## 2016-11-08 LAB — MORPHOLOGY: PLT EST: ADEQUATE

## 2016-11-08 NOTE — Progress Notes (Signed)
OFFICE PROGRESS NOTE   November 08, 2016   Physicians: E.Griffin (D.Simonds), (D.Jacobs) Wille Glaser [place], [place])  INTERVAL HISTORY:   Patient is seen, together with husband, in follow up of continuing weight loss and poor appetite, with diagnosis of CLL and chronic leukopenia. She has been on observation for the CLL since difficulty with memory around Rituxan administrations 2009.   When patient was seen 06-2016, weight loss seemed secondary to intentional change in diet and increased exercise with regular line dancing. However she has continued to lose weight, now down another 9 lbs. She reports nausea when she tries to eat, tho she continues to eat 3 meals daily, including [place], collards, oatmeal and orgain supplement drink, otherwise drinks only water; she has some early satiety, minimal GERD and bowels are moving regularly. She is using ginger and fresh pineapple for the nausea, prefers no antiemetic medications now. She has some new pain "pulled muscle" right upper buttocks and right low back, which she relates to coughing. She has had upper and lower respiratory symptoms since 08-2016, with clear rhinorrhea, some cough, no fever, no SOB. She was seen by PCP, given cough syrup with narcotic and tessalon, no antibiotics. No night sweats. She has known environmental allergies,  never tried claritin etc due to remote intolerance to seldane, does use saline nose spray. She has had leg cramps recently.  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 CLL diagnosis was made by biopsy of left cervical lymph node and bone marrow exam, CD20 positive by flow cytometry. She was treated for the CLL with Rituxan in 2005/ 2006 and again Jan 2008 thru Feb 2009,with good response, but held then due to complaints of transient  short term memory loss after each RItuxan treatment. She previously had consultation with Dr Elon Alas at [facility], diagnoses as above.   Objective:  Vital signs in last 24 hours:  BP (!) 114/40 (BP Location: Left Arm, Patient Position: Sitting)   Pulse 87   Temp 98.3 F (36.8 C)   Resp 14   Ht 5' (1.524 m)   Wt 118 lb 11.2 oz (53.8 kg)   SpO2 100%   BMI 23.18 kg/m  Weight down from 127 in 06-2016. Does not appear acutely uncomfortable or acutely ill. Respirations not labored. Alert, oriented and appropriate. Ambulatory without difficulty.  No alopecia  HEENT:PERRL, sclerae not icteric. Oral mucosa moist without lesions, posterior pharynx clear.  Neck supple. No JVD.  Lymphatics:soft cervical and axillary adenopathy, not tender or bulky, no obvious change from 06-2016 Resp: clear to auscultation bilaterally and normal percussion bilaterally Cardio: regular rate and rhythm. No gallop. GI: soft, nontender, not distended, no mass or appreciable HSM. Normally active bowel sounds.  Musculoskeletal/ Extremities: UE/ LE without pitting edema, cords, tenderness Neuro: no peripheral neuropathy. Otherwise nonfocal CN, motor, sensory, cerebellar Skin without rash, ecchymosis, petechiae   Lab Results:  Results for orders placed or performed in visit on 11/08/16  CBC with Differential  Result Value Ref Range   WBC 16.4 (H) 3.9 - 10.3 10e3/uL   NEUT# 1.8 1.5 - 6.5 10e3/uL   HGB 13.1 11.6 - 15.9 g/dL   HCT 39.6 34.8 - 46.6 %   Platelets 166 145 - 400 10e3/uL   MCV 97.3 79.5 - 101.0 fL   MCH 32.2 25.1 -  34.0 pg   MCHC 33.1 31.5 - 36.0 g/dL   RBC 4.07 3.70 - 5.45 10e6/uL   RDW 15.8 (H) 11.2 - 14.5 %   lymph# 13.7 (H) 0.9 - 3.3 10e3/uL   MONO# 0.7 0.1 - 0.9 10e3/uL   Eosinophils Absolute 0.3 0.0 - 0.5 10e3/uL   Basophils Absolute 0.0 0.0 - 0.1 10e3/uL   NEUT% 10.8 (L) 38.4 - 76.8 %   LYMPH% 83.4 (H) 14.0 - 49.7 %   MONO% 4.1 0.0 - 14.0 %   EOS% 1.5 0.0 - 7.0 %   BASO% 0.2 0.0 -  2.0 %  Morphology  Result Value Ref Range   Polychromasia Slight Slight   Ovalocytes Few Negative   Smudge Cells Few Negative   White Cell Comments Variant Lymphs    PLT EST Adequate Adequate  Comprehensive metabolic panel  Result Value Ref Range   Sodium 141 136 - 145 mEq/L   Potassium 4.5 3.5 - 5.1 mEq/L   Chloride 105 98 - 109 mEq/L   CO2 27 22 - 29 mEq/L   Glucose 96 70 - 140 mg/dl   BUN 14.6 7.0 - 26.0 mg/dL   Creatinine 0.8 0.6 - 1.1 mg/dL   Total Bilirubin 0.52 0.20 - 1.20 mg/dL   Alkaline Phosphatase 107 40 - 150 U/L   AST 42 (H) 5 - 34 U/L   ALT 16 0 - 55 U/L   Total Protein 6.2 (L) 6.4 - 8.3 g/dL   Albumin 3.3 (L) 3.5 - 5.0 g/dL   Calcium 9.6 8.4 - 10.4 mg/dL   Anion Gap 9 3 - 11 mEq/L   EGFR 80 (L) >90 ml/min/1.73 m2     Studies/Results:  Last CT CAP 2014 Mammograms at [facility] 12-01-15 extremely dense breast tissue, so should have tomo with next imaging CXR 02-03-16 Dexa 11-2015 osteoporosis  Medications: I have reviewed the patient's current medications. She is reluctant to try claritin; I have asked her to use flonase or nasocort daily for now. She declines antiemetics now  DISCUSSION Continuing weight loss with nausea and early satiety concerning, tho nothing else obvious on exam. Will get CTs and see her back in 1-2 weeks. Encouraged 6 small meals daily. Flonase for apparent environmental allergies   Patient and husband are aware that another physician in this practice will assume her care beginning at least in Jan.  Assessment/Plan:  1. CLL: good response previously to Rituxan but concerns about cognitive effects. Systemic symptoms not clear but are concerning for possible progression, scans as above.  Low immunoglobulins previously has not had IVIG but could consider this if more infectious problems.  2.chronic benign leukopenia which preceded the CLL diagnosis 3.continued weight loss no longer intentional. See above  4.environmental allergies and  intolerances, including gluten 5.extremely dense breast tissue: 3D tomo mammograms most appropriate, due again at [facility] in Dec 2017 6.degenerative changes in spine by CT 7.diverticular changes in colon by CT 8.small upper ventral hermia seems unchanged, contained fat only at last imaging. 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 Intolerance to flu vaccine. Husband has had his flu vaccine.    All questions answered and patient is in agreement with recommendations and plans as above. Time spent 25 min including >50% counseling and coordination of care. Cc Dr Laurann Montana.    Evlyn Clines, MD   11/08/2016, 5:28 PM

## 2016-11-12 ENCOUNTER — Ambulatory Visit (HOSPITAL_COMMUNITY)
Admission: RE | Admit: 2016-11-12 | Discharge: 2016-11-12 | Disposition: A | Discharge status: Home / Self Care | Payer: Medicare Other | Source: Ambulatory Visit | Attending: Oncology | Admitting: Oncology

## 2016-11-12 ENCOUNTER — Encounter (HOSPITAL_COMMUNITY): Payer: Self-pay

## 2016-11-12 DIAGNOSIS — K5730 Diverticulosis of large intestine without perforation or abscess without bleeding: Secondary | ICD-10-CM | POA: Insufficient documentation

## 2016-11-12 DIAGNOSIS — M4856XA Collapsed vertebra, not elsewhere classified, lumbar region, initial encounter for fracture: Secondary | ICD-10-CM | POA: Diagnosis not present

## 2016-11-12 DIAGNOSIS — R918 Other nonspecific abnormal finding of lung field: Secondary | ICD-10-CM | POA: Insufficient documentation

## 2016-11-12 DIAGNOSIS — C9110 Chronic lymphocytic leukemia of B-cell type not having achieved remission: Secondary | ICD-10-CM | POA: Diagnosis not present

## 2016-11-12 DIAGNOSIS — E041 Nontoxic single thyroid nodule: Secondary | ICD-10-CM | POA: Diagnosis not present

## 2016-11-12 DIAGNOSIS — I700 Atherosclerosis of aorta: Secondary | ICD-10-CM | POA: Insufficient documentation

## 2016-11-12 MED ORDER — IOPAMIDOL (ISOVUE-300) INJECTION 61%
75.0000 mL | Freq: Once | INTRAVENOUS | Status: AC | PRN
Start: 2016-11-12 — End: 2016-11-12
  Administered 2016-11-12: 75 mL via INTRAVENOUS

## 2016-11-12 MED ORDER — IOPAMIDOL (ISOVUE-300) INJECTION 61%
INTRAVENOUS | Status: AC
  Filled 2016-11-12: qty 100

## 2016-11-14 ENCOUNTER — Encounter: Payer: Self-pay | Admitting: Oncology

## 2016-11-14 DIAGNOSIS — R634 Abnormal weight loss: Secondary | ICD-10-CM | POA: Insufficient documentation

## 2016-11-21 ENCOUNTER — Other Ambulatory Visit: Payer: Self-pay | Admitting: Oncology

## 2016-11-21 DIAGNOSIS — C9110 Chronic lymphocytic leukemia of B-cell type not having achieved remission: Secondary | ICD-10-CM

## 2016-11-22 ENCOUNTER — Encounter: Payer: Self-pay | Admitting: Oncology

## 2016-11-22 ENCOUNTER — Other Ambulatory Visit (HOSPITAL_COMMUNITY)
Admission: RE | Admit: 2016-11-22 | Discharge: 2016-11-22 | Disposition: A | Discharge status: Home / Self Care | Payer: Medicare Other | Source: Ambulatory Visit | Attending: Oncology | Admitting: Oncology

## 2016-11-22 ENCOUNTER — Encounter: Payer: Self-pay | Admitting: Nutrition

## 2016-11-22 ENCOUNTER — Encounter: Payer: Medicare Other | Admitting: Nutrition

## 2016-11-22 ENCOUNTER — Other Ambulatory Visit (HOSPITAL_BASED_OUTPATIENT_CLINIC_OR_DEPARTMENT_OTHER): Payer: Medicare Other

## 2016-11-22 ENCOUNTER — Ambulatory Visit (HOSPITAL_BASED_OUTPATIENT_CLINIC_OR_DEPARTMENT_OTHER): Payer: Medicare Other | Admitting: Oncology

## 2016-11-22 VITALS — BP 138/62 | HR 77 | Temp 97.5°F | Resp 18 | Ht 60.0 in | Wt 117.9 lb

## 2016-11-22 DIAGNOSIS — S32010S Wedge compression fracture of first lumbar vertebra, sequela: Secondary | ICD-10-CM

## 2016-11-22 DIAGNOSIS — C9110 Chronic lymphocytic leukemia of B-cell type not having achieved remission: Secondary | ICD-10-CM | POA: Insufficient documentation

## 2016-11-22 DIAGNOSIS — I700 Atherosclerosis of aorta: Secondary | ICD-10-CM | POA: Diagnosis not present

## 2016-11-22 DIAGNOSIS — D72819 Decreased white blood cell count, unspecified: Secondary | ICD-10-CM | POA: Diagnosis not present

## 2016-11-22 DIAGNOSIS — R634 Abnormal weight loss: Secondary | ICD-10-CM

## 2016-11-22 DIAGNOSIS — Z9109 Other allergy status, other than to drugs and biological substances: Secondary | ICD-10-CM

## 2016-11-22 LAB — CBC WITH DIFFERENTIAL/PLATELET
BASO%: 0.3 % (ref 0.0–2.0)
BASOS ABS: 0.1 10*3/uL (ref 0.0–0.1)
EOS ABS: 0.3 10*3/uL (ref 0.0–0.5)
EOS%: 1.7 % (ref 0.0–7.0)
HEMATOCRIT: 39.8 % (ref 34.8–46.6)
HGB: 12.9 g/dL (ref 11.6–15.9)
LYMPH#: 14.5 10*3/uL — AB (ref 0.9–3.3)
LYMPH%: 85.3 % — ABNORMAL HIGH (ref 14.0–49.7)
MCH: 31.4 pg (ref 25.1–34.0)
MCHC: 32.3 g/dL (ref 31.5–36.0)
MCV: 97.2 fL (ref 79.5–101.0)
MONO#: 0.6 10*3/uL (ref 0.1–0.9)
MONO%: 3.4 % (ref 0.0–14.0)
NEUT#: 1.6 10*3/uL (ref 1.5–6.5)
NEUT%: 9.3 % — AB (ref 38.4–76.8)
PLATELETS: 145 10*3/uL (ref 145–400)
RBC: 4.1 10*6/uL (ref 3.70–5.45)
RDW: 16.1 % — ABNORMAL HIGH (ref 11.2–14.5)
WBC: 17 10*3/uL — ABNORMAL HIGH (ref 3.9–10.3)

## 2016-11-22 LAB — COMPREHENSIVE METABOLIC PANEL
ALT: 23 U/L (ref 0–55)
ANION GAP: 9 meq/L (ref 3–11)
AST: 49 U/L — ABNORMAL HIGH (ref 5–34)
Albumin: 3.6 g/dL (ref 3.5–5.0)
Alkaline Phosphatase: 112 U/L (ref 40–150)
BILIRUBIN TOTAL: 0.52 mg/dL (ref 0.20–1.20)
BUN: 20 mg/dL (ref 7.0–26.0)
CALCIUM: 9.8 mg/dL (ref 8.4–10.4)
CHLORIDE: 106 meq/L (ref 98–109)
CO2: 27 mEq/L (ref 22–29)
CREATININE: 0.9 mg/dL (ref 0.6–1.1)
EGFR: 77 mL/min/{1.73_m2} — AB (ref >90)
Glucose: 69 mg/dl — ABNORMAL LOW (ref 70–140)
Potassium: 4 mEq/L (ref 3.5–5.1)
Sodium: 143 mEq/L (ref 136–145)
Total Protein: 6.4 g/dL (ref 6.4–8.3)

## 2016-11-22 LAB — TECHNOLOGIST REVIEW

## 2016-11-22 LAB — LACTATE DEHYDROGENASE: LDH: 251 U/L — AB (ref 125–245)

## 2016-11-22 NOTE — Progress Notes (Signed)
OFFICE PROGRESS NOTE   November 24, 2016   Physicians: E.Griffin (D.Simonds), (D.Jacobs) Wille Glaser [place], [place])  INTERVAL HISTORY:  Patient is seen, together with husband, in follow up of unintentional weight loss and other symptoms in setting of CLL, with additional workup in progress. She had CT CAP 11-12-16, which shows significant increase in adenopathy abdomen and pelvis compared with CTs 2014.   Weight is down another pound from 11-08-16 and  Patient actually has felt much better since she was here on 11-08-16, having begun nasocort daily, which she had never tried for environmental allergies previously. She reports that the clear rhinorrhea and sinus congestion are much better and cough has resolved, those symptoms having started in 08-2016 and ongoing until nasocort begun. Note she is usually reluctant to try any medications, has not been willing to try claritin etc since remote problem with seldane. Since improvement with nasocort, she is now sleeping well, energy much better and appetite good; yesterday she ate fish, [place], sweet potatoes, dressing,  and cleaned house.  She has no purulent sinus drainage. She denies SOB or wheezing and no chest pain. No fever, no night sweats. Soreness to direct pressure mid upper abdomen seems to be area of the ventral hernia, no other pain. No nausea, vomiting, GERD. Bowels are moving regularly without change. No abdominal or pelvic pain. No LE swelling. No bleeding. No other pain. No HA. Patient feels that she is a little forgetful at times "I leave my purse" but unchanged x several years. 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 CLL diagnosis was made by biopsy of left cervical lymph node and bone marrow exam, CD20 positive by flow cytometry. She was treated  for the CLL with Rituxan in 2005/ 2006 and again Jan 2008 thru Feb 2009,with good response, but held then due to complaints of transient short term memory loss after each RItuxan treatment. She was seen by Dr Elon Alas at [facility] 2003, diagnoses as above, test results but not notes available in [place] in this EMR.   Objective:  Vital signs in last 24 hours:  BP 138/62 (BP Location: Left Arm, Patient Position: Sitting)   Pulse 77   Temp 97.5 F (36.4 C) (Oral)   Resp 18   Ht 5' (1.524 m)   Wt 117 lb 14.4 oz (53.5 kg)   SpO2 99%   BMI 23.03 kg/m  Weight down 1 lb from 11-08-16 and 10 lbs from 06-2016.  Alert, oriented and appropriate. Ambulatory without difficulty. Looks brighter and more energetic today, less nasally congested, no cough.   HEENT:PERRL, sclerae not icteric. Oral mucosa moist without lesions, posterior pharynx now minimal dull erythema bilaterally. Nasal turbinates less boggy tho still present especially on left.  Neck supple. No JVD.  Lymphatics:low cervical L>R soft, soft adenopathy up to 3-4 cm left suraclavicular, 1.5 - 2.5 cm soft nonbulky bilateraly axillary, no bulky inguinal adenopathy Resp: no wheezing now, no rales or crackles, clear to auscultation bilaterally and normal percussion bilaterally Cardio: regular rate and rhythm. No gallop. GI: soft, slightly tender over area of upper small ventral hernia which is soft, is visible as she sits up from supine.  Abdomen not distended, no mass or organomegaly. Normally active bowel sounds. Musculoskeletal/ Extremities: without pitting edema, cords, tenderness Neuro:Speech fluent; CN, motor, sensory, cerebellar  nonfocal. Skin without rash, ecchymosis, petechiae   Lab Results:  Results for orders placed or performed in visit on 11/22/16  TECHNOLOGIST REVIEW  Result Value Ref Range   Technologist Review Variant lymphs present, few smudge cells   Hepatitis C antibody (reflex if positive)  Result Value Ref  Range   HCV Ab <0.1 0.0 - 0.9 s/co ratio   Comment: Comment   CBC with Differential  Result Value Ref Range   WBC 17.0 (H) 3.9 - 10.3 10e3/uL   NEUT# 1.6 1.5 - 6.5 10e3/uL   HGB 12.9 11.6 - 15.9 g/dL   HCT 39.8 34.8 - 46.6 %   Platelets 145 145 - 400 10e3/uL   MCV 97.2 79.5 - 101.0 fL   MCH 31.4 25.1 - 34.0 pg   MCHC 32.3 31.5 - 36.0 g/dL   RBC 4.10 3.70 - 5.45 10e6/uL   RDW 16.1 (H) 11.2 - 14.5 %   lymph# 14.5 (H) 0.9 - 3.3 10e3/uL   MONO# 0.6 0.1 - 0.9 10e3/uL   Eosinophils Absolute 0.3 0.0 - 0.5 10e3/uL   Basophils Absolute 0.1 0.0 - 0.1 10e3/uL   NEUT% 9.3 (L) 38.4 - 76.8 %   LYMPH% 85.3 (H) 14.0 - 49.7 %   MONO% 3.4 0.0 - 14.0 %   EOS% 1.7 0.0 - 7.0 %   BASO% 0.3 0.0 - 2.0 %  Comprehensive metabolic panel  Result Value Ref Range   Sodium 143 136 - 145 mEq/L   Potassium 4.0 3.5 - 5.1 mEq/L   Chloride 106 98 - 109 mEq/L   CO2 27 22 - 29 mEq/L   Glucose 69 (L) 70 - 140 mg/dl   BUN 20.0 7.0 - 26.0 mg/dL   Creatinine 0.9 0.6 - 1.1 mg/dL   Total Bilirubin 0.52 0.20 - 1.20 mg/dL   Alkaline Phosphatase 112 40 - 150 U/L   AST 49 (H) 5 - 34 U/L   ALT 23 0 - 55 U/L   Total Protein 6.4 6.4 - 8.3 g/dL   Albumin 3.6 3.5 - 5.0 g/dL   Calcium 9.8 8.4 - 10.4 mg/dL   Anion Gap 9 3 - 11 mEq/L   EGFR 77 (L) >90 ml/min/1.73 m2  Lactate dehydrogenase (LDH)  Result Value Ref Range   LDH 251 (H) 125 - 245 U/L  Beta 2 microglobulin  Result Value Ref Range   Beta-2 3.6 (H) 0.6 - 2.4 mg/L  Hepatitis B surface antigen  Result Value Ref Range   HBsAg Screen Negative Negative  Hepatitis B surface antibody  Result Value Ref Range   Hep B Surface Ab, Qual Non Reactive   FISH peripheral blood and flow pending   Studies/Results: EXAM: CT CHEST, ABDOMEN, AND PELVIS WITH CONTRAST  COMPARISON:  03/06/2013 CT chest, abdomen and pelvis.  FINDINGS: CT CHEST FINDINGS  Cardiovascular: Stable top-normal heart size. No significant pericardial fluid/thickening. Atherosclerotic  nonaneurysmal thoracic aorta. Normal caliber pulmonary arteries. No central pulmonary emboli.  Mediastinum/Nodes: Stable heterogeneous hypodense 2.0 cm thyroid nodule arising inferiorly from the thyroid isthmus. Unremarkable esophagus. Mild-to-moderate bilateral axillary lymphadenopathy, stable to mildly increased, for example a 1.6 cm right axillary node has increased from 1.4 cm on 03/06/2013, and a 1.6 cm left axillary node (series 2/image 14) previously measured 1.6 cm using similar measurement technique, stable. Right paratracheal adenopathy measuring up to 1.7 cm (series 2/image 20), previously 1.4 cm, mildly increased. Enlarged 1.6 cm subcarinal node (series 2/ image 26), increased from 1.1 cm. Enlarged 1.5 cm AP window node (  series 2/image 16), increased from 1.1 cm. Increased bilateral hilar adenopathy. For example 1.4 cm right hilar node (series 2/ image 24), increased from 0.8 cm. Left hilar 1.5 cm node (series 2/ image 25), mildly increased from 1.3 cm.  Lungs/Pleura: No pneumothorax. No pleural effusion. There is increased scattered interlobular septal thickening throughout both lungs with associated new extensive fine peribronchovascular and subpleural nodularity throughout both lungs, suggestive of lymphangitic tumor.  Musculoskeletal: No aggressive appearing focal osseous lesions. Moderate thoracic spondylosis.  CT ABDOMEN PELVIS FINDINGS  Hepatobiliary: Normal liver size. Stable granulomatous calcifications scattered in the liver. No liver masses. Normal gallbladder with no radiopaque cholelithiasis. No biliary ductal dilatation.  Pancreas: Normal, with no mass or duct dilation.  Spleen: Normal size. No mass.  Adrenals/Urinary Tract: Normal adrenals. Normal kidneys with no hydronephrosis and no renal mass. Normal bladder.  Stomach/Bowel: Grossly normal stomach. Normal caliber small bowel with no small bowel wall thickening. Normal appendix. Mild  sigmoid diverticulosis, with no large bowel wall thickening or pericolonic fat stranding. Oral contrast reaches the distal rectum.  Vascular/Lymphatic: Atherosclerotic nonaneurysmal abdominal aorta. Patent portal, splenic, hepatic and renal veins. New mild gastrohepatic ligament adenopathy up to 1.1 cm (series 2/ image 49). Porta hepatis adenopathy measuring up to 1.7 cm (series 2/ image 51), increased from 1.0 cm. Significant growth of bulky confluent paracaval, aortocaval and left para-aortic adenopathy. For example a 4.4 cm anterior aortocaval node (series 2/ image 67), increased from 1.6 cm. Left periaortic 2.6 cm node (series 2/image 61), increased from 1.5 cm. Right retrocaval 3.4 cm node (series 2/image 60), increased from 2.0 cm. Bulky bilateral common and external iliac adenopathy is significantly increased. For example a 3.9 cm right common iliac node (series 2/image 73) is increased from 1.4 cm. A 2.3 cm left common iliac node (series 2/image 72) is increased mildly from 2.0 cm. Right external iliac 2.6 cm node (series 2/image 83) is increased from 1.5 cm. Left external iliac 1.5 cm node (series 2/image 85), previously 1.4 cm, minimally increased.  Reproductive: Grossly normal uterus.  No adnexal mass.  Other: No pneumoperitoneum, ascites or focal fluid collection. Moderate midline high ventral abdominal wall hernia contains fat and a small amount of (series 2/image 50), increased in size.  Musculoskeletal: No aggressive appearing focal osseous lesions. Mild superior L1 vertebral compression fracture is new since 03/06/2013. Moderate lumbar spondylosis      IMPRESSION: 1. Significant progression of bulky confluent retroperitoneal and bilateral pelvic lymphadenopathy. Mild progression of thoracic lymphadenopathy involving the bilateral axillary, mediastinal and bilateral hilar chains. 2. 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 3. Normal size spleen. 4. Additional findings include aortic atherosclerosis, stable 2.0 cm inferior thyroid isthmus nodule mild sigmoid diverticulosis, mild superior L1 vertebral compression fracture of indeterminate chronicity (new since 03/06/2013), and increased size of moderate midline high ventral abdominal wall hernia containing fat and fluid.  PACs images reviewed with patient and husband at visit.  Medications: I have reviewed the patient's current medications.  DISCUSSION  CT report reviewed, and images as above. Respiratory symptoms much better, no longer wheezing as had been present at last exam and no longer any cough, no SOB. Clinically this is not consistent with lymphangitic tumor in lungs. Bulky adenopathy progressed in abdomen and pelvis, without ureteral obstruction or bowel involvement.   With progression of bulky adenopathy in abdomen and pelvis and with weight loss, I have  asked general surgery to see for possible node biopsy of an accessible peripheral node, to be sure no change from previous CLL findings. She will see Dr Rosendo Gros at [facility] on 11-24-16.  We are pleased that some of the symptoms have noticeably improved just with addition of daily nasocort, as it is difficult at times to distinguish her environmental allergies from other problems. She is reluctant to continue nasocort, does not want to use steroids, however she is willing to continue for now given obvious improvement. I have again mentioned that she could at least try Claritin etc (has refused to try since remote problem with Seldane).    Patient tells me now that she would be willing to consider Rituxan again if this is recommended (patient had excellent response to Rituxan ~ 2009, stopped this due to her concerns about slight memory loss after treatments).   She agrees with having care transitioned to hematologist at [facility], timing of which I will discuss with other physicians.   Assessment/Plan:  1. CLL: tho some of recent concerns probably not related, she does have progressive bulky adenopathy abdomen and pelvis by scans, and unintentional weight loss. Repeating workup as above to be sure no changes in CLL diagnosis, including repeat peripheral node biopsy if surgeon agrees. Will coordinate transfer of care at [facility]. Patient understands that next physician may also recommend bone marrow exam (last preceded this EMR) 2.chronic benign leukopenia which preceded the CLL diagnosis, documented back at least to 1986. 3.significant environmental allergies, food intolerances including gluten, poor tolerance to multiple medications. Respiratory symptoms upper and lower much better with Nasocort daily x last 2 weeks, which I have asked her to continue. Sleep and appetite better with improvement in these symptoms.  She has not wanted referral to allergist.  4.small upper ventral abdominal hernia: slightly tender, only fat and fluid per CT 5.no enlarged spleen by CT 6.extremely dense breast tissue: 3D tomo mammograms most appropriate, due again at [facility] in Dec 2017 7.degenerative changes in spine by CT. Mild L1 compression fracture on CT 10-2016, new from 2014, age indeterminate, no acute symptoms. 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 Aortic atherosclerosis by CT. Stable 2 cm thyroid isthmus nodule by CT. Cc report + this note to PCP 11.Reported intolerance to flu vaccine, which she has refused for years. Husband has had flu vaccine. Will follow up with [facility]CP, as I believe she has had both pneumonia vaccines (?)   All questions answered and patient is more comfortable with information now. Time spent 30 min including >50% counseling and coordination of care. Cc Drs Laurann Montana and Rosendo Gros.  Brenda Clines, MD    11/24/2016, 8:14 AM

## 2016-11-22 NOTE — Progress Notes (Signed)
Patient did not show up for nutrition appointment. 

## 2016-11-23 LAB — HEPATITIS C ANTIBODY (REFLEX): HCV Ab: <0.1 s/co ratio (ref 0.0–0.9)

## 2016-11-23 LAB — HEPATITIS B SURFACE ANTIBODY,QUALITATIVE: HEP B SURFACE AB, QUAL: NONREACTIVE

## 2016-11-23 LAB — BETA 2 MICROGLOBULIN, SERUM: BETA 2: 3.6 mg/L — AB (ref 0.6–2.4)

## 2016-11-23 LAB — HEPATITIS B SURFACE ANTIGEN: HEP B S AG: NEGATIVE

## 2016-11-24 ENCOUNTER — Telehealth: Payer: Self-pay | Admitting: *Deleted

## 2016-11-24 ENCOUNTER — Telehealth: Payer: Self-pay

## 2016-11-24 ENCOUNTER — Other Ambulatory Visit: Payer: Self-pay | Admitting: Oncology

## 2016-11-24 DIAGNOSIS — I700 Atherosclerosis of aorta: Secondary | ICD-10-CM | POA: Insufficient documentation

## 2016-11-24 DIAGNOSIS — S32010S Wedge compression fracture of first lumbar vertebra, sequela: Secondary | ICD-10-CM | POA: Insufficient documentation

## 2016-11-24 LAB — FLOW CYTOMETRY

## 2016-11-24 NOTE — Telephone Encounter (Signed)
Received a copy of Ms Brenda Terrell' immunization record from Dr. Kelton Pillar.  Sent a copy to HIM to be scanned into patient's EMR.  Up dated  EMR Immunizations as well.

## 2016-11-24 NOTE — Telephone Encounter (Signed)
Informed pt of Dr. Calton Dach message. Asked her to call us when she knows her surgery date so we can get her scheduled to see Dr. Alvy Bimler after that.  Pt verbalized understanding and will call when she has a surgery date.  She sees the Surgeon on 12/8.

## 2016-11-24 NOTE — Telephone Encounter (Signed)
-----   Message from Gordy Levan, MD sent at 11/24/2016  8:00 AM EST ----- Regarding: records to surgeon for apt today 11-29 at 2:50 She is to see Dr Ralene Ok at [facility] on 11-24-16 at 2:50. Please ask that office if they need records faxed or not (they are not on EPIC but may still have enough access not to need records sent)  If need to fax records My note 11-27 - almost ready now My note 11-13 Latest CBC CMET CT report 11-17 meds Demographics.  thanks

## 2016-11-24 NOTE — Telephone Encounter (Addendum)
Spoke with Doris in scheduling at [employer].  The physicians can see the records in the Epic system so no records need to be faxed.  Doris stated that Ms Frigon' appointment with Dr. Rosendo Gros was canceled for today and rescheduled with Dr. Kae Heller on 12-03-16 at 1545.

## 2016-11-24 NOTE — Telephone Encounter (Signed)
-----   Message from Heath Lark, MD sent at 11/24/2016 11:36 AM EST ----- Regarding: taking over her case from Dr. Marko Plume Can you call her and tell her I will see her next month after she has biopsy with her surgeon? Maybe she will let us know after she sees the surgeon and let us know when the surgery will be?  Thanks

## 2016-11-24 NOTE — Telephone Encounter (Signed)
-----   Message from Gordy Levan, MD sent at 11/24/2016  9:13 AM EST ----- Regarding: pneumonia vaccines at [facility] this week, please ask Dr Quin Hoop office [facility]) which pneumonia vaccines patient has received, and dates. (I don't believe able to print out the outside summary form other than at an actual visit?)  Please update our EMR immunizations  thanks

## 2016-11-26 LAB — TISSUE HYBRIDIZATION TO NCBH

## 2016-11-26 LAB — FISH, PERIPHERAL BLOOD

## 2016-11-29 ENCOUNTER — Other Ambulatory Visit: Payer: Self-pay | Admitting: Oncology

## 2016-12-02 DIAGNOSIS — Z803 Family history of malignant neoplasm of breast: Secondary | ICD-10-CM | POA: Diagnosis not present

## 2016-12-02 DIAGNOSIS — Z1231 Encounter for screening mammogram for malignant neoplasm of breast: Secondary | ICD-10-CM | POA: Diagnosis not present

## 2016-12-03 ENCOUNTER — Ambulatory Visit: Payer: Self-pay | Admitting: Surgery

## 2016-12-03 DIAGNOSIS — C8590 Non-Hodgkin lymphoma, unspecified, unspecified site: Secondary | ICD-10-CM | POA: Diagnosis not present

## 2016-12-03 NOTE — H&P (Signed)
Brenda Terrell. Brenda Terrell 12/03/2016 3:41 PM Location: [facility] Patient #: [id number] DOB: [birth year] Married / Language: English / Race: Black or African American Female  History of Present Illness (Colten Desroches A. Kae Heller MD; 12/03/2016 4:28 PM) Patient words: This is a very nice 75 year old woman with a history of lymphoma. Last received chemotherapy in 2009. She presented recently to her oncologist with an unintentional 20 pound weight loss, night sweats, nausea, and lymphadenopathy. She underwent a CT scan last month showed significant retroperitoneal and mediastinal adenopathy.  The patient is a 75 year old female.   Other Problems Nance Pear, [place]; 12/03/2016 3:41 PM) Arthritis Asthma Back Pain Cancer Diverticulosis Gastroesophageal Reflux Disease General anesthesia - complications Heart murmur  Past Surgical History Nance Pear, CMA; 12/03/2016 3:41 PM) Cataract Surgery Bilateral. Foot Surgery Bilateral. Oral Surgery Sentinel Lymph Node Biopsy Tonsillectomy  Diagnostic Studies History Nance Pear, CMA; 12/03/2016 3:41 PM) Colonoscopy 1-5 years ago Mammogram within last year  Allergies Nance Pear, CMA; 12/03/2016 3:43 PM) Sulfa Antibiotics Swelling. Salicylates Rash. Aspartame and Phenylalanine Swelling.  Medication History Nance Pear, [place]; 12/03/2016 3:45 PM) Hydromet (5-1.5MG /5ML Syrup, Oral) Active. Benzonatate (100MG  Capsule, Oral daily) Active. Pazeo (0.7% Solution, Ophthalmic daily) Active. Multi-Minerals (Oral daily) Active. ProAir HFA (108 (90 Base)MCG/ACT Aerosol Soln, Inhalation as needed) Active. Nasacort AQ (55MCG/ACT Aerosol, Nasal as needed) Active. Medications Reconciled  Social History Nance Pear, [place]; 12/03/2016 3:41 PM) Alcohol use Occasional alcohol use. Caffeine use Carbonated beverages, Coffee, Tea. No drug use Tobacco use Never smoker.  Family History Nance Pear, [place]; 12/03/2016 3:41  PM) Breast Cancer Mother. Diabetes Mellitus Mother. Hypertension Mother.  Pregnancy / Birth History Nance Pear, [place]; 12/03/2016 3:41 PM) Age at menarche 40 years. Age of menopause <44 Contraceptive History Oral contraceptives. Gravida 2 Maternal age 16-20 Para 2     Review of Systems ([place]; 12/03/2016 3:41 PM) General Present- Appetite Loss, Night Sweats and Weight Loss. Not Present- Chills, Fatigue, Fever and Weight Gain. Skin Not Present- Change in Wart/Mole, Dryness, Hives, Jaundice, New Lesions, Non-Healing Wounds, Rash and Ulcer. HEENT Present- Seasonal Allergies, Sinus Pain and Wears glasses/contact lenses. Not Present- Earache, Hearing Loss, Hoarseness, Nose Bleed, Oral Ulcers, Ringing in the Ears, Sore Throat, Visual Disturbances and Yellow Eyes. Respiratory Present- Chronic Cough. Not Present- Bloody sputum, Difficulty Breathing, Snoring and Wheezing. Breast Not Present- Breast Mass, Breast Pain, Nipple Discharge and Skin Changes. Cardiovascular Present- Leg Cramps. Not Present- Chest Pain, Difficulty Breathing Lying Down, Palpitations, Rapid Heart Rate, Shortness of Breath and Swelling of Extremities. Gastrointestinal Present- Nausea. Not Present- Abdominal Pain, Bloating, Bloody Stool, Change in Bowel Habits, Chronic diarrhea, Constipation, Difficulty Swallowing, Excessive gas, Gets full quickly at meals, Hemorrhoids, Indigestion, Rectal Pain and Vomiting. Female Genitourinary Not Present- Frequency, Nocturia, Painful Urination, Pelvic Pain and Urgency. Musculoskeletal Present- Back Pain, Joint Pain and Joint Stiffness. Not Present- Muscle Pain, Muscle Weakness and Swelling of Extremities. Neurological Not Present- Decreased Memory, Fainting, Headaches, Numbness, Seizures, Tingling, Tremor, Trouble walking and Weakness. Endocrine Present- Hair Changes and Hot flashes. Not Present- Cold Intolerance, Excessive Hunger, Heat Intolerance and New  Diabetes. Hematology Present- Easy Bruising. Not Present- Blood Thinners, Excessive bleeding, Gland problems, HIV and Persistent Infections.  Vitals Bary Castilla Bradford CMA; 12/03/2016 3:45 PM) 12/03/2016 3:45 PM Weight: 119.8 lb Height: 60in Body Surface Area: 1.5 m Body Mass Index: 23.4 kg/m  Temp.: 98.51F  Pulse: 81 (Regular)  BP: 124/78 (Sitting, Left Arm, Standard)      Physical Exam (Niamya Vittitow A. Kae Heller MD; 12/03/2016 4:29 PM)  The physical exam findings are as follows: Note:She is alert and oriented, no distress Anicteric. Extraocular motion intact. There is cervical lymphadenopathy left greater than right. There is supraclavicular lymphadenopathy left greater than right. There is axillary lymphadenopathy, right greater than left. Trachea is midline, no other neck mass Unlabored respirations. Abdomen is benign Extremities are warm without deformity Neuro grossly intact, normal gait    Assessment & Plan (Sally-Anne Wamble A. Kae Heller MD; 12/03/2016 4:30 PM)  LYMPHOMA (C85.90) Story: History of CLL, last treated in 2009. Symptoms of recurrence with visible and palpable lymphadenopathy. We'll plan for excisional biopsy of left supraclavicular nodes as these are the most easily palpable. The right axilla is also easily accessible.

## 2016-12-05 ENCOUNTER — Other Ambulatory Visit: Payer: Self-pay | Admitting: Oncology

## 2016-12-07 ENCOUNTER — Telehealth: Payer: Self-pay | Admitting: *Deleted

## 2016-12-07 NOTE — Telephone Encounter (Signed)
Don't worry about it. I will order if needed after I see her

## 2016-12-07 NOTE — Telephone Encounter (Signed)
PT left VM states her surgery is scheduled for 12/31/16 at 10:30 am.

## 2016-12-07 NOTE — Telephone Encounter (Signed)
Informed pt of appt w/ Dr. Alvy Bimler on 1/9.  She is ok w/ this date/ time.  Does she need lab appt same day?

## 2016-12-07 NOTE — Telephone Encounter (Signed)
I can see her at 2 pm, 30 mins on 1/9. Please send scheduling msg if acceptable to her

## 2016-12-08 ENCOUNTER — Telehealth: Payer: Self-pay | Admitting: *Deleted

## 2016-12-08 NOTE — Telephone Encounter (Signed)
Pt is able to come 1/9 @ 1:00 for 1:30 appt.  Msg to scheduler

## 2016-12-08 NOTE — Telephone Encounter (Signed)
-----   Message from Heath Lark, MD sent at 12/08/2016  2:10 PM EST ----- Regarding: RE: non-urgent, can call Wednesday I can see her at 130 pm on 1/9. She needs to come in at 1 pm. Can you call if that's OK and then send scheduling msg? ----- Message ----- From: Patton Salles, RN Sent: 12/08/2016   2:05 PM To: Heath Lark, MD Subject: RE: non-urgent, can call Wednesday             Surgery 12/31/16  ----- Message ----- From: Heath Lark, MD Sent: 12/06/2016  10:23 AM To: Cathlean Cower, RN, Patton Salles, RN Subject: non-urgent, can call Wednesday                 Can you find out when is her surgery?

## 2016-12-23 NOTE — Patient Instructions (Addendum)
Andretta Walloch [place]  12/23/2016   Your procedure is scheduled on: Friday 12/31/2016  Report to [facility]  Entrance take [place]  elevators to 3rd floor to  [place] at   [place]  AM.  Call this number if you have problems the morning of surgery [phone]   Remember: ONLY 1 PERSON MAY GO WITH YOU TO SHORT STAY TO GET  READY MORNING OF [place].    Do not eat food or drink liquids :After Midnight.     Take these medicines the morning of surgery with A SIP OF WATER: Pro-Air inhaler if needed                                 You may not have any metal on your body including hair pins and              piercings  Do not wear jewelry, make-up, lotions, powders or perfumes, deodorant             Do not wear nail polish.  Do not shave  48 hours prior to surgery.              Men may shave face and neck.   Do not bring valuables to the hospital. [place].  Contacts, dentures or bridgework may not be worn into surgery.  Leave suitcase in the car. After surgery it may be brought to your room.     Patients discharged the day of surgery will not be allowed to drive home.  Name and phone number of your driver:spouse- Ray  Special Instructions: N/A              Please read over the following fact sheets you were given: _____________________________________________________________________             [facility] - Preparing for Surgery Before surgery, you can play an important role.  Because skin is not sterile, your skin needs to be as free of germs as possible.  You can reduce the number of germs on your skin by washing with CHG (chlorahexidine gluconate) soap before surgery.  CHG is an antiseptic cleaner which kills germs and bonds with the skin to continue killing germs even after washing. Please DO NOT use if you have an allergy to CHG or antibacterial soaps.  If your skin becomes reddened/irritated  stop using the CHG and inform your nurse when you arrive at Short Stay. Do not shave (including legs and underarms) for at least 48 hours prior to the first CHG shower.  You may shave your face/neck. Please follow these instructions carefully:  1.  Shower with CHG Soap the night before surgery and the  morning of Surgery.  2.  If you choose to wash your hair, wash your hair first as usual with your  normal  shampoo.  3.  After you shampoo, rinse your hair and body thoroughly to remove the  shampoo.                           4.  Use CHG as you would any other liquid soap.  You can apply chg directly  to the skin and wash  Gently with a scrungie or clean washcloth.  5.  Apply the CHG Soap to your body ONLY FROM THE NECK DOWN.   Do not use on face/ open                           Wound or open sores. Avoid contact with eyes, ears mouth and genitals (private parts).                       Wash face,  Genitals (private parts) with your normal soap.             6.  Wash thoroughly, paying special attention to the area where your surgery  will be performed.  7.  Thoroughly rinse your body with warm water from the neck down.  8.  DO NOT shower/wash with your normal soap after using and rinsing off  the CHG Soap.                9.  Pat yourself dry with a clean towel.            10.  Wear clean pajamas.            11.  Place clean sheets on your bed the night of your first shower and do not  sleep with pets. Day of Surgery : Do not apply any lotions/deodorants the morning of surgery.  Please wear clean clothes to the hospital/surgery center.  FAILURE TO FOLLOW THESE INSTRUCTIONS MAY RESULT IN THE CANCELLATION OF YOUR SURGERY PATIENT SIGNATURE_________________________________  NURSE SIGNATURE__________________________________  ________________________________________________________________________

## 2016-12-28 ENCOUNTER — Encounter (HOSPITAL_COMMUNITY): Payer: Self-pay

## 2016-12-28 ENCOUNTER — Encounter (HOSPITAL_COMMUNITY)
Admission: RE | Admit: 2016-12-28 | Discharge: 2016-12-28 | Disposition: A | Discharge status: Home / Self Care | Payer: Medicare Other | Source: Ambulatory Visit | Attending: Surgery | Admitting: Surgery

## 2016-12-28 DIAGNOSIS — Z809 Family history of malignant neoplasm, unspecified: Secondary | ICD-10-CM | POA: Diagnosis not present

## 2016-12-28 DIAGNOSIS — Z833 Family history of diabetes mellitus: Secondary | ICD-10-CM | POA: Diagnosis not present

## 2016-12-28 DIAGNOSIS — Z9842 Cataract extraction status, left eye: Secondary | ICD-10-CM | POA: Diagnosis not present

## 2016-12-28 DIAGNOSIS — Z803 Family history of malignant neoplasm of breast: Secondary | ICD-10-CM | POA: Diagnosis not present

## 2016-12-28 DIAGNOSIS — Z79899 Other long term (current) drug therapy: Secondary | ICD-10-CM | POA: Diagnosis not present

## 2016-12-28 DIAGNOSIS — M1990 Unspecified osteoarthritis, unspecified site: Secondary | ICD-10-CM | POA: Diagnosis not present

## 2016-12-28 DIAGNOSIS — M549 Dorsalgia, unspecified: Secondary | ICD-10-CM | POA: Diagnosis not present

## 2016-12-28 DIAGNOSIS — Z888 Allergy status to other drugs, medicaments and biological substances status: Secondary | ICD-10-CM | POA: Diagnosis not present

## 2016-12-28 DIAGNOSIS — C9110 Chronic lymphocytic leukemia of B-cell type not having achieved remission: Secondary | ICD-10-CM | POA: Diagnosis not present

## 2016-12-28 DIAGNOSIS — Z886 Allergy status to analgesic agent status: Secondary | ICD-10-CM | POA: Diagnosis not present

## 2016-12-28 DIAGNOSIS — I739 Peripheral vascular disease, unspecified: Secondary | ICD-10-CM | POA: Diagnosis not present

## 2016-12-28 DIAGNOSIS — Z9221 Personal history of antineoplastic chemotherapy: Secondary | ICD-10-CM | POA: Diagnosis not present

## 2016-12-28 DIAGNOSIS — R591 Generalized enlarged lymph nodes: Secondary | ICD-10-CM | POA: Diagnosis present

## 2016-12-28 DIAGNOSIS — J45909 Unspecified asthma, uncomplicated: Secondary | ICD-10-CM | POA: Diagnosis not present

## 2016-12-28 DIAGNOSIS — Z882 Allergy status to sulfonamides status: Secondary | ICD-10-CM | POA: Diagnosis not present

## 2016-12-28 DIAGNOSIS — K219 Gastro-esophageal reflux disease without esophagitis: Secondary | ICD-10-CM | POA: Diagnosis not present

## 2016-12-28 DIAGNOSIS — Z8249 Family history of ischemic heart disease and other diseases of the circulatory system: Secondary | ICD-10-CM | POA: Diagnosis not present

## 2016-12-28 DIAGNOSIS — R011 Cardiac murmur, unspecified: Secondary | ICD-10-CM | POA: Diagnosis not present

## 2016-12-28 DIAGNOSIS — Z9841 Cataract extraction status, right eye: Secondary | ICD-10-CM | POA: Diagnosis not present

## 2016-12-28 HISTORY — DX: Adverse effect of unspecified anesthetic, initial encounter: T41.45XA

## 2016-12-28 HISTORY — DX: Nausea with vomiting, unspecified: R11.2

## 2016-12-28 HISTORY — DX: Other specified postprocedural states: Z98.890

## 2016-12-28 HISTORY — DX: Other complications of anesthesia, initial encounter: T88.59XA

## 2016-12-28 LAB — CBC
HEMATOCRIT: 37.6 % (ref 36.0–46.0)
HEMOGLOBIN: 12.5 g/dL (ref 12.0–15.0)
MCH: 32.3 pg (ref 26.0–34.0)
MCHC: 33.2 g/dL (ref 30.0–36.0)
MCV: 97.2 fL (ref 78.0–100.0)
Platelets: 151 10*3/uL (ref 150–400)
RBC: 3.87 MIL/uL (ref 3.87–5.11)
RDW: 16.3 % — ABNORMAL HIGH (ref 11.5–15.5)
WBC: 23 10*3/uL — ABNORMAL HIGH (ref 4.0–10.5)

## 2016-12-28 LAB — BASIC METABOLIC PANEL
ANION GAP: 6 (ref 5–15)
BUN: 22 mg/dL — ABNORMAL HIGH (ref 6–20)
CO2: 30 mmol/L (ref 22–32)
Calcium: 9.7 mg/dL (ref 8.9–10.3)
Chloride: 105 mmol/L (ref 101–111)
Creatinine, Ser: 0.65 mg/dL (ref 0.44–1.00)
GLUCOSE: 89 mg/dL (ref 65–99)
POTASSIUM: 5.3 mmol/L — AB (ref 3.5–5.1)
Sodium: 141 mmol/L (ref 135–145)

## 2016-12-30 ENCOUNTER — Telehealth: Payer: Self-pay | Admitting: Hematology and Oncology

## 2016-12-30 DIAGNOSIS — H401131 Primary open-angle glaucoma, bilateral, mild stage: Secondary | ICD-10-CM | POA: Diagnosis not present

## 2016-12-30 DIAGNOSIS — H35033 Hypertensive retinopathy, bilateral: Secondary | ICD-10-CM | POA: Diagnosis not present

## 2016-12-30 DIAGNOSIS — H20041 Secondary noninfectious iridocyclitis, right eye: Secondary | ICD-10-CM | POA: Diagnosis not present

## 2016-12-30 NOTE — Telephone Encounter (Signed)
FAXED PT RECORDS TO [facility] [phone]

## 2016-12-31 ENCOUNTER — Encounter (HOSPITAL_COMMUNITY)
Admission: RE | Disposition: A | Discharge status: Home / Self Care | Payer: Self-pay | Source: Ambulatory Visit | Attending: Surgery

## 2016-12-31 ENCOUNTER — Encounter (HOSPITAL_COMMUNITY): Payer: Self-pay | Admitting: *Deleted

## 2016-12-31 ENCOUNTER — Ambulatory Visit (HOSPITAL_COMMUNITY): Payer: Medicare Other | Admitting: Anesthesiology

## 2016-12-31 ENCOUNTER — Ambulatory Visit (HOSPITAL_COMMUNITY)
Admission: RE | Admit: 2016-12-31 | Discharge: 2016-12-31 | Disposition: A | Discharge status: Home / Self Care | Payer: Medicare Other | Source: Ambulatory Visit | Attending: Surgery | Admitting: Surgery

## 2016-12-31 DIAGNOSIS — M549 Dorsalgia, unspecified: Secondary | ICD-10-CM | POA: Diagnosis not present

## 2016-12-31 DIAGNOSIS — M1990 Unspecified osteoarthritis, unspecified site: Secondary | ICD-10-CM | POA: Insufficient documentation

## 2016-12-31 DIAGNOSIS — K219 Gastro-esophageal reflux disease without esophagitis: Secondary | ICD-10-CM | POA: Diagnosis not present

## 2016-12-31 DIAGNOSIS — C8301 Small cell B-cell lymphoma, lymph nodes of head, face, and neck: Secondary | ICD-10-CM | POA: Diagnosis not present

## 2016-12-31 DIAGNOSIS — Z809 Family history of malignant neoplasm, unspecified: Secondary | ICD-10-CM | POA: Insufficient documentation

## 2016-12-31 DIAGNOSIS — Z888 Allergy status to other drugs, medicaments and biological substances status: Secondary | ICD-10-CM | POA: Insufficient documentation

## 2016-12-31 DIAGNOSIS — I739 Peripheral vascular disease, unspecified: Secondary | ICD-10-CM | POA: Insufficient documentation

## 2016-12-31 DIAGNOSIS — R011 Cardiac murmur, unspecified: Secondary | ICD-10-CM | POA: Insufficient documentation

## 2016-12-31 DIAGNOSIS — Z886 Allergy status to analgesic agent status: Secondary | ICD-10-CM | POA: Diagnosis not present

## 2016-12-31 DIAGNOSIS — J45909 Unspecified asthma, uncomplicated: Secondary | ICD-10-CM | POA: Insufficient documentation

## 2016-12-31 DIAGNOSIS — D863 Sarcoidosis of skin: Secondary | ICD-10-CM | POA: Diagnosis not present

## 2016-12-31 DIAGNOSIS — Z882 Allergy status to sulfonamides status: Secondary | ICD-10-CM | POA: Insufficient documentation

## 2016-12-31 DIAGNOSIS — Z9842 Cataract extraction status, left eye: Secondary | ICD-10-CM | POA: Diagnosis not present

## 2016-12-31 DIAGNOSIS — C8590 Non-Hodgkin lymphoma, unspecified, unspecified site: Secondary | ICD-10-CM | POA: Diagnosis not present

## 2016-12-31 DIAGNOSIS — C8591 Non-Hodgkin lymphoma, unspecified, lymph nodes of head, face, and neck: Secondary | ICD-10-CM | POA: Diagnosis not present

## 2016-12-31 DIAGNOSIS — C9110 Chronic lymphocytic leukemia of B-cell type not having achieved remission: Secondary | ICD-10-CM | POA: Insufficient documentation

## 2016-12-31 DIAGNOSIS — Z9221 Personal history of antineoplastic chemotherapy: Secondary | ICD-10-CM | POA: Insufficient documentation

## 2016-12-31 DIAGNOSIS — Z9841 Cataract extraction status, right eye: Secondary | ICD-10-CM | POA: Diagnosis not present

## 2016-12-31 DIAGNOSIS — Z79899 Other long term (current) drug therapy: Secondary | ICD-10-CM | POA: Insufficient documentation

## 2016-12-31 DIAGNOSIS — Z803 Family history of malignant neoplasm of breast: Secondary | ICD-10-CM | POA: Insufficient documentation

## 2016-12-31 DIAGNOSIS — Z833 Family history of diabetes mellitus: Secondary | ICD-10-CM | POA: Insufficient documentation

## 2016-12-31 DIAGNOSIS — D72819 Decreased white blood cell count, unspecified: Secondary | ICD-10-CM | POA: Diagnosis not present

## 2016-12-31 DIAGNOSIS — Z8249 Family history of ischemic heart disease and other diseases of the circulatory system: Secondary | ICD-10-CM | POA: Insufficient documentation

## 2016-12-31 HISTORY — PX: LYMPH NODE BIOPSY: SHX201

## 2016-12-31 SURGERY — LYMPH NODE BIOPSY
Anesthesia: General | Laterality: Left

## 2016-12-31 MED ORDER — BUPIVACAINE HCL (PF) 0.25 % IJ SOLN
INTRAMUSCULAR | Status: DC | PRN
  Administered 2016-12-31: 3 mL

## 2016-12-31 MED ORDER — FENTANYL CITRATE (PF) 100 MCG/2ML IJ SOLN
INTRAMUSCULAR | Status: AC
  Filled 2016-12-31: qty 2

## 2016-12-31 MED ORDER — PHENYLEPHRINE 40 MCG/ML (10ML) SYRINGE FOR IV PUSH (FOR BLOOD PRESSURE SUPPORT)
PREFILLED_SYRINGE | INTRAVENOUS | Status: AC
  Filled 2016-12-31: qty 10

## 2016-12-31 MED ORDER — FENTANYL CITRATE (PF) 100 MCG/2ML IJ SOLN: 25.0000 ug | INTRAMUSCULAR | Status: DC | PRN

## 2016-12-31 MED ORDER — ACETAMINOPHEN 650 MG RE SUPP
650.0000 mg | RECTAL | Status: DC | PRN
  Filled 2016-12-31: qty 1

## 2016-12-31 MED ORDER — HYDROMORPHONE HCL 1 MG/ML IJ SOLN: 0.2500 mg | INTRAMUSCULAR | Status: DC | PRN

## 2016-12-31 MED ORDER — LIDOCAINE HCL (CARDIAC) 20 MG/ML IV SOLN
INTRAVENOUS | Status: DC | PRN
  Administered 2016-12-31: 50 mg via INTRAVENOUS

## 2016-12-31 MED ORDER — ONDANSETRON HCL 4 MG/2ML IJ SOLN
INTRAMUSCULAR | Status: AC
  Filled 2016-12-31: qty 2

## 2016-12-31 MED ORDER — SCOPOLAMINE 1 MG/3DAYS TD PT72
MEDICATED_PATCH | TRANSDERMAL | Status: AC
  Filled 2016-12-31: qty 1

## 2016-12-31 MED ORDER — FENTANYL CITRATE (PF) 100 MCG/2ML IJ SOLN
INTRAMUSCULAR | Status: DC | PRN
  Administered 2016-12-31: 50 ug via INTRAVENOUS

## 2016-12-31 MED ORDER — 0.9 % SODIUM CHLORIDE (POUR BTL) OPTIME
TOPICAL | Status: DC | PRN
  Administered 2016-12-31: 1000 mL

## 2016-12-31 MED ORDER — DEXAMETHASONE SODIUM PHOSPHATE 10 MG/ML IJ SOLN
INTRAMUSCULAR | Status: AC
  Filled 2016-12-31: qty 1

## 2016-12-31 MED ORDER — DEXAMETHASONE SODIUM PHOSPHATE 10 MG/ML IJ SOLN
INTRAMUSCULAR | Status: DC | PRN
  Administered 2016-12-31: 10 mg via INTRAVENOUS

## 2016-12-31 MED ORDER — CHLORHEXIDINE GLUCONATE CLOTH 2 % EX PADS: 6.0000 | MEDICATED_PAD | Freq: Once | CUTANEOUS | Status: DC

## 2016-12-31 MED ORDER — HYDROCODONE-ACETAMINOPHEN 5-325 MG PO TABS: 1.0000 | ORAL_TABLET | Freq: Four times a day (QID) | ORAL | 0 refills | Status: DC | PRN

## 2016-12-31 MED ORDER — CEFAZOLIN SODIUM-DEXTROSE 2-4 GM/100ML-% IV SOLN
2.0000 g | INTRAVENOUS | Status: AC
  Administered 2016-12-31: 2 g via INTRAVENOUS
  Filled 2016-12-31: qty 100

## 2016-12-31 MED ORDER — BUPIVACAINE HCL (PF) 0.25 % IJ SOLN
INTRAMUSCULAR | Status: AC
  Filled 2016-12-31: qty 30

## 2016-12-31 MED ORDER — PROPOFOL 10 MG/ML IV BOLUS
INTRAVENOUS | Status: DC | PRN
  Administered 2016-12-31: 100 mg via INTRAVENOUS

## 2016-12-31 MED ORDER — DOCUSATE SODIUM 100 MG PO CAPS
100.0000 mg | ORAL_CAPSULE | Freq: Two times a day (BID) | ORAL | 0 refills | Status: DC
Start: 2016-12-31 — End: 2016-12-31

## 2016-12-31 MED ORDER — SCOPOLAMINE 1 MG/3DAYS TD PT72
1.0000 | MEDICATED_PATCH | TRANSDERMAL | Status: DC
  Administered 2016-12-31: 1.5 mg via TRANSDERMAL

## 2016-12-31 MED ORDER — LACTATED RINGERS IV SOLN
INTRAVENOUS | Status: DC
  Administered 2016-12-31: 09:00:00 via INTRAVENOUS

## 2016-12-31 MED ORDER — CEFAZOLIN SODIUM-DEXTROSE 2-4 GM/100ML-% IV SOLN
INTRAVENOUS | Status: AC
  Filled 2016-12-31: qty 100

## 2016-12-31 MED ORDER — PROMETHAZINE HCL 25 MG/ML IJ SOLN: 6.2500 mg | INTRAMUSCULAR | Status: DC | PRN

## 2016-12-31 MED ORDER — OXYCODONE HCL 5 MG PO TABS: 5.0000 mg | ORAL_TABLET | ORAL | Status: DC | PRN

## 2016-12-31 MED ORDER — PROPOFOL 10 MG/ML IV BOLUS
INTRAVENOUS | Status: AC
  Filled 2016-12-31: qty 20

## 2016-12-31 MED ORDER — ACETAMINOPHEN 325 MG PO TABS: 650.0000 mg | ORAL_TABLET | ORAL | Status: DC | PRN

## 2016-12-31 MED ORDER — PHENYLEPHRINE HCL 10 MG/ML IJ SOLN
INTRAMUSCULAR | Status: DC | PRN
  Administered 2016-12-31 (×4): 40 ug via INTRAVENOUS

## 2016-12-31 MED ORDER — DOCUSATE SODIUM 100 MG PO CAPS: 100.0000 mg | ORAL_CAPSULE | Freq: Two times a day (BID) | ORAL | 0 refills | Status: DC

## 2016-12-31 SURGICAL SUPPLY — 30 items
ADH SKN CLS APL DERMABOND .7 (GAUZE/BANDAGES/DRESSINGS) ×1
BLADE SURG SZ10 CARB STEEL (BLADE) ×3 IMPLANT
COVER SURGICAL LIGHT HANDLE (MISCELLANEOUS) ×3 IMPLANT
DECANTER SPIKE VIAL GLASS SM (MISCELLANEOUS) IMPLANT
DERMABOND ADVANCED (GAUZE/BANDAGES/DRESSINGS) ×2
DERMABOND ADVANCED .7 DNX12 (GAUZE/BANDAGES/DRESSINGS) IMPLANT
DRAPE LAPAROTOMY TRNSV 102X78 (DRAPE) ×2 IMPLANT
ELECT PENCIL ROCKER SW 15FT (MISCELLANEOUS) ×3 IMPLANT
ELECT REM PT RETURN 9FT ADLT (ELECTROSURGICAL) ×3
ELECTRODE REM PT RTRN 9FT ADLT (ELECTROSURGICAL) ×1 IMPLANT
GAUZE SPONGE 4X4 12PLY STRL (GAUZE/BANDAGES/DRESSINGS) ×1 IMPLANT
GLOVE BIOGEL PI IND STRL 7.0 (GLOVE) ×1 IMPLANT
GLOVE BIOGEL PI INDICATOR 7.0 (GLOVE) ×2
GOWN STRL REUS W/TWL LRG LVL4 (GOWN DISPOSABLE) ×2 IMPLANT
GOWN STRL REUS W/TWL XL LVL3 (GOWN DISPOSABLE) ×3 IMPLANT
KIT BASIN OR (CUSTOM PROCEDURE TRAY) ×3 IMPLANT
NEEDLE HYPO 22GX1.5 SAFETY (NEEDLE) ×5 IMPLANT
PACK BASIC VI WITH GOWN DISP (CUSTOM PROCEDURE TRAY) ×3 IMPLANT
SPONGE LAP 18X18 X RAY DECT (DISPOSABLE) ×3 IMPLANT
STAPLER VISISTAT 35W (STAPLE) IMPLANT
SUT MNCRL AB 4-0 PS2 18 (SUTURE) ×2 IMPLANT
SUT VIC AB 3-0 SH 18 (SUTURE) IMPLANT
SUT VIC AB 3-0 SH 27 (SUTURE) ×3
SUT VIC AB 3-0 SH 27XBRD (SUTURE) IMPLANT
SUT VIC AB 4-0 PS2 18 (SUTURE) IMPLANT
SYR 20CC LL (SYRINGE) ×3 IMPLANT
SYR BULB IRRIGATION 50ML (SYRINGE) ×2 IMPLANT
TOWEL OR 17X26 10 PK STRL BLUE (TOWEL DISPOSABLE) ×3 IMPLANT
TOWEL OR NON WOVEN STRL DISP B (DISPOSABLE) ×3 IMPLANT
YANKAUER SUCT BULB TIP 10FT TU (MISCELLANEOUS) ×3 IMPLANT

## 2016-12-31 NOTE — Interval H&P Note (Signed)
History and Physical Interval Note:  12/31/2016 7:21 AM  Brenda Terrell  has presented today for surgery, with the diagnosis of lymphoma  The various methods of treatment have been discussed with the patient and family. After consideration of risks, benefits and other options for treatment, the patient has consented to  Procedure(s): LYMPH NODE BIOPSY LEFT SUPRACLIAVICULAR VS RIGHT AXILLARY (N/A) as a surgical intervention .  The patient's history has been reviewed, patient examined, no change in status, stable for surgery. I can no longer feel the right axillary adenopathy but there is an enlarged, mobile node in the left supraclavicular region so we'll have to go for that one.  I have reviewed the patient's chart and labs.  Questions were answered to the patient's satisfaction.     Chad Donoghue Rich Brave

## 2016-12-31 NOTE — Progress Notes (Signed)
Pt presented this am preop reporting bilateral leg swelling below legs last night , which was new. She stated that it improved after sleeping last night.  No pitting edema in feet noted. No shortness of breath, rash, itching, etc noted.  Dr Windle Guard in to see Pt

## 2016-12-31 NOTE — Op Note (Signed)
Operative Note  VIRGIA TACK  [id number]  [id number]  12/31/2016   Surgeon: Clovis Riley  Assistant: none  Procedure performed: excisional biopsy left supraclavicular lymph nodes  Preop diagnosis: lymphadenopathy, history of lymphoma Post-op diagnosis/intraop findings: multiple 1cm lymph nodes in the left supraclavicularregion  Specimens: 3 lymph nodes fresh for lymphoma protocol Retained items: none EBL: [id number] Complications: none  Description of procedure: After obtaining informed consent the patient was taken to the operating room and placed supine on operating room table wheregeneral endotracheal anesthesia was initiated, preoperative antibiotics were administered, SCDs applied, and a formal timeout was performed. The left neck and supraclavicular region were prepped and draped in usual sterile fashion. A 3 cm incision was made along a skin fold in the area of greatest palpable lymphadenopathy in the supraclavicular region which was just lateral to the sternocleidomastoid. The dermis and platysma were divided using cautery and then blunt dissection was used cautiously to expose a bundle of somewhat matted lymph nodes that were just lateral and deep to the sternocleidomastoid. Ongoing blunt dissection was used until I could bring about 3 of these lymph nodes each of which measured about a centimeter in size into the field and they were excised using cautery ensuring no injury to surrounding small vascular structures. These were sent fresh for lymphoma protocol. Hemostasis was assured within the wound. The platysma was then reapproximated with interrupted 3-0 Vicryl's and the skin was closed with running subcuticular Monocryl and Dermabond. The patient was then awakened, extubated and taken to PACU in stable condition.   All counts were correct at the completion of the case.

## 2016-12-31 NOTE — Discharge Instructions (Signed)
General Anesthesia, Adult, Care After These instructions provide you with information about caring for yourself after your procedure. Your health care provider may also give you more specific instructions. Your treatment has been planned according to current medical practices, but problems sometimes occur. Call your health care provider if you have any problems or questions after your procedure. What can I expect after the procedure? After the procedure, it is common to have:  Vomiting.  A sore throat.  Mental slowness. It is common to feel:  Nauseous.  Cold or shivery.  Sleepy.  Tired.  Sore or achy, even in parts of your body where you did not have surgery. Follow these instructions at home: For at least 24 hours after the procedure:  Do not:  Participate in activities where you could fall or become injured.  Drive.  Use heavy machinery.  Drink alcohol.  Take sleeping pills or medicines that cause drowsiness.  Make important decisions or sign legal documents.  Take care of children on your own.  Rest. Eating and drinking  If you vomit, drink water, juice, or soup when you can drink without vomiting.  Drink enough fluid to keep your urine clear or pale yellow.  Make sure you have little or no nausea before eating solid foods.  Follow the diet recommended by your health care provider. General instructions  Have a responsible adult stay with you until you are awake and alert.  Return to your normal activities as told by your health care provider. Ask your health care provider what activities are safe for you.  Take over-the-counter and prescription medicines only as told by your health care provider.  If you smoke, do not smoke without supervision.  Keep all follow-up visits as told by your health care provider. This is important. Contact a health care provider if:  You continue to have nausea or vomiting at home, and medicines are not helpful.  You  cannot drink fluids or start eating again.  You cannot urinate after 8-12 hours.  You develop a skin rash.  You have fever.  You have increasing redness at the site of your procedure. Get help right away if:  You have difficulty breathing.  You have chest pain.  You have unexpected bleeding.  You feel that you are having a life-threatening or urgent problem. This information is not intended to replace advice given to you by your health care provider. Make sure you discuss any questions you have with your health care provider. Document Released: 03/21/2001 Document Revised: 05/17/2016 Document Reviewed: 11/27/2015 Elsevier Interactive Patient Education  2017 [place] Office Phone Number [phone]     Lymph node biopsy: POST OP INSTRUCTIONS  Always review your discharge instruction sheet given to you by the facility where your surgery was performed.  IF YOU HAVE DISABILITY OR FAMILY LEAVE FORMS, YOU MUST BRING THEM TO THE OFFICE FOR PROCESSING.  DO NOT GIVE THEM TO YOUR DOCTOR.  1. A prescription for pain medication may be given to you upon discharge.  Take your pain medication as prescribed, if needed.  If narcotic pain medicine is not needed, then you may take acetaminophen (Tylenol) or ibuprofen (Advil) as needed. 2. Take your usually prescribed medications unless otherwise directed 3. If you need a refill on your pain medication, please contact your pharmacy.  They will contact our office to request authorization.  Prescriptions will not be filled after 5pm or on week-ends. 4. You should eat very light the first 24 hours  after surgery, such as soup, crackers, pudding, etc.  Resume your normal diet the day after surgery. 5. Most patients will experience some swelling and bruising in the surgical site.  Ice packs will help.  Swelling and bruising can take several days to resolve.  6. It is common to experience some constipation if taking pain  medication after surgery.  Increasing fluid intake and taking a stool softener will usually help or prevent this problem from occurring.  A mild laxative (Milk of Magnesia or Miralax) should be taken according to package directions if there are no bowel movements after 48 hours. 7. Unless discharge instructions indicate otherwise, you may remove your bandages 24-48 hours after surgery, and you may shower at that time.  You may have steri-strips (small skin tapes) in place directly over the incision.  These strips should be left on the skin for 7-10 days.  If your surgeon used skin glue on the incision, you may shower in 24 hours.  The glue will flake off over the next 2-3 weeks.  Any sutures or staples will be removed at the office during your follow-up visit. 8. ACTIVITIES:  You may resume regular daily activities (gradually increasing) beginning the next day.  You may have sexual intercourse when it is comfortable. a. You may drive when you no longer are taking prescription pain medication, you can comfortably wear a seatbelt, and you can safely maneuver your car and apply brakes. b. RETURN TO WORK:  _when desired____________________________________________________________________________ 9. You should see your doctor in the office for a follow-up appointment approximately two weeks after your surgery.   10. OTHER INSTRUCTIONS: _______________________________________________________________________________________________ _____________________________________________________________________________________________________________________________________ _____________________________________________________________________________________________________________________________________ _____________________________________________________________________________________________________________________________________  WHEN TO CALL YOUR DOCTOR: 1. Fever over 101.0 2. Nausea and/or vomiting. 3. Extreme  swelling or bruising. 4. Continued bleeding from incision. 5. Increased pain, redness, or drainage from the incision.  The clinic staff is available to answer your questions during regular business hours.  Please dont hesitate to call and ask to speak to one of the nurses for clinical concerns.  If you have a medical emergency, go to the nearest emergency room or call 911.  A surgeon from [facility] is always on call at the hospital.  For further questions, please visit [email or web address]

## 2016-12-31 NOTE — H&P (View-Only) (Signed)
Brenda Terrell. Brenda Terrell 12/03/2016 3:41 PM Location: [facility] Patient #: [id number] DOB: 01-12-1941 Married / Language: English / Race: Black or African American Female  History of Present Illness (Brenda Chain A. Kae Heller MD; 12/03/2016 4:28 PM) Patient words: This is a very nice 76 year old woman with a history of lymphoma. Last received chemotherapy in 2009. She presented recently to her oncologist with an unintentional 20 pound weight loss, night sweats, nausea, and lymphadenopathy. She underwent a CT scan last month showed significant retroperitoneal and mediastinal adenopathy.  The patient is a 76 year old female.   Other Problems Nance Pear, [place]; 12/03/2016 3:41 PM) Arthritis Asthma Back Pain Cancer Diverticulosis Gastroesophageal Reflux Disease General anesthesia - complications Heart murmur  Past Surgical History Nance Pear, CMA; 12/03/2016 3:41 PM) Cataract Surgery Bilateral. Foot Surgery Bilateral. Oral Surgery Sentinel Lymph Node Biopsy Tonsillectomy  Diagnostic Studies History Nance Pear, CMA; 12/03/2016 3:41 PM) Colonoscopy 1-5 years ago Mammogram within last year  Allergies Nance Pear, CMA; 12/03/2016 3:43 PM) Sulfa Antibiotics Swelling. Salicylates Rash. Aspartame and Phenylalanine Swelling.  Medication History Nance Pear, [place]; 12/03/2016 3:45 PM) Hydromet (5-1.5MG /5ML Syrup, Oral) Active. Benzonatate (100MG  Capsule, Oral daily) Active. Pazeo (0.7% Solution, Ophthalmic daily) Active. Multi-Minerals (Oral daily) Active. ProAir HFA (108 (90 Base)MCG/ACT Aerosol Soln, Inhalation as needed) Active. Nasacort AQ (55MCG/ACT Aerosol, Nasal as needed) Active. Medications Reconciled  Social History Nance Pear, [place]; 12/03/2016 3:41 PM) Alcohol use Occasional alcohol use. Caffeine use Carbonated beverages, Coffee, Tea. No drug use Tobacco use Never smoker.  Family History Nance Pear, [place]; 12/03/2016 3:41  PM) Breast Cancer Mother. Diabetes Mellitus Mother. Hypertension Mother.  Pregnancy / Birth History Nance Pear, [place]; 12/03/2016 3:41 PM) Age at menarche 62 years. Age of menopause <44 Contraceptive History Oral contraceptives. Gravida 2 Maternal age 30-20 Para 2     Review of Systems ([place]; 12/03/2016 3:41 PM) General Present- Appetite Loss, Night Sweats and Weight Loss. Not Present- Chills, Fatigue, Fever and Weight Gain. Skin Not Present- Change in Wart/Mole, Dryness, Hives, Jaundice, New Lesions, Non-Healing Wounds, Rash and Ulcer. HEENT Present- Seasonal Allergies, Sinus Pain and Wears glasses/contact lenses. Not Present- Earache, Hearing Loss, Hoarseness, Nose Bleed, Oral Ulcers, Ringing in the Ears, Sore Throat, Visual Disturbances and Yellow Eyes. Respiratory Present- Chronic Cough. Not Present- Bloody sputum, Difficulty Breathing, Snoring and Wheezing. Breast Not Present- Breast Mass, Breast Pain, Nipple Discharge and Skin Changes. Cardiovascular Present- Leg Cramps. Not Present- Chest Pain, Difficulty Breathing Lying Down, Palpitations, Rapid Heart Rate, Shortness of Breath and Swelling of Extremities. Gastrointestinal Present- Nausea. Not Present- Abdominal Pain, Bloating, Bloody Stool, Change in Bowel Habits, Chronic diarrhea, Constipation, Difficulty Swallowing, Excessive gas, Gets full quickly at meals, Hemorrhoids, Indigestion, Rectal Pain and Vomiting. Female Genitourinary Not Present- Frequency, Nocturia, Painful Urination, Pelvic Pain and Urgency. Musculoskeletal Present- Back Pain, Joint Pain and Joint Stiffness. Not Present- Muscle Pain, Muscle Weakness and Swelling of Extremities. Neurological Not Present- Decreased Memory, Fainting, Headaches, Numbness, Seizures, Tingling, Tremor, Trouble walking and Weakness. Endocrine Present- Hair Changes and Hot flashes. Not Present- Cold Intolerance, Excessive Hunger, Heat Intolerance and New  Diabetes. Hematology Present- Easy Bruising. Not Present- Blood Thinners, Excessive bleeding, Gland problems, HIV and Persistent Infections.  Vitals Bary Castilla Bradford CMA; 12/03/2016 3:45 PM) 12/03/2016 3:45 PM Weight: 119.8 lb Height: 60in Body Surface Area: 1.5 m Body Mass Index: 23.4 kg/m  Temp.: 98.89F  Pulse: 81 (Regular)  BP: 124/78 (Sitting, Left Arm, Standard)      Physical Exam (Latricia Cerrito A. Kae Heller MD; 12/03/2016 4:29 PM)  The physical exam findings are as follows: Note:She is alert and oriented, no distress Anicteric. Extraocular motion intact. There is cervical lymphadenopathy left greater than right. There is supraclavicular lymphadenopathy left greater than right. There is axillary lymphadenopathy, right greater than left. Trachea is midline, no other neck mass Unlabored respirations. Abdomen is benign Extremities are warm without deformity Neuro grossly intact, normal gait    Assessment & Plan (Nastashia Gallo A. Kae Heller MD; 12/03/2016 4:30 PM)  LYMPHOMA (C85.90) Story: History of CLL, last treated in 2009. Symptoms of recurrence with visible and palpable lymphadenopathy. We'll plan for excisional biopsy of left supraclavicular nodes as these are the most easily palpable. The right axilla is also easily accessible.

## 2016-12-31 NOTE — Anesthesia Postprocedure Evaluation (Signed)
Anesthesia Post Note  Patient: Brenda Terrell  Procedure(s) Performed: Procedure(s) (LRB): LYMPH NODE BIOPSY LEFT SUPRACLIAVICULAR (Left)  Patient location during evaluation: PACU Anesthesia Type: General Level of consciousness: sedated Pain management: pain level controlled Vital Signs Assessment: post-procedure vital signs reviewed and stable Respiratory status: spontaneous breathing and respiratory function stable Cardiovascular status: stable Anesthetic complications: no       Last Vitals:  Vitals:   12/31/16 1109 12/31/16 1132  BP: (!) 134/58 (!) 129/42  Pulse: 79 71  Resp: 12 16  Temp: 36.4 C 36.7 C    Last Pain:  Vitals:   12/31/16 1132  TempSrc: Oral  PainSc:                  Shaylea Ucci DANIEL

## 2016-12-31 NOTE — Transfer of Care (Signed)
Immediate Anesthesia Transfer of Care Note  Patient: Brenda Terrell  Procedure(s) Performed: Procedure(s): LYMPH NODE BIOPSY LEFT SUPRACLIAVICULAR (Left)  Patient Location: PACU  Anesthesia Type:General  Level of Consciousness: awake, alert  and oriented  Airway & Oxygen Therapy: Patient Spontanous Breathing and Patient connected to face mask oxygen  Post-op Assessment: Report given to RN and Post -op Vital signs reviewed and stable  Post vital signs: Reviewed and stable  Last Vitals:  Vitals:   12/31/16 0717  BP: (!) 151/70  Pulse: (!) 108  Resp: 16  Temp: 36.9 C    Last Pain:  Vitals:   12/31/16 0741  TempSrc:   PainSc: 0-No pain      Patients Stated Pain Goal: 1 ([id number])  Complications: No apparent anesthesia complications

## 2016-12-31 NOTE — Anesthesia Preprocedure Evaluation (Addendum)
Anesthesia Evaluation   Patient awake    Reviewed: Allergy & Precautions  History of Anesthesia Complications (+) PONV and history of anesthetic complications  Airway Mallampati: II  TM Distance: >3 FB Neck ROM: Full    Dental no notable dental hx. (+) Dental Advisory Given, Teeth Intact   Pulmonary asthma ,    Pulmonary exam normal        Cardiovascular + Peripheral Vascular Disease  Normal cardiovascular exam     Neuro/Psych negative neurological ROS  negative psych ROS   GI/Hepatic Neg liver ROS, GERD  ,  Endo/Other  negative endocrine ROS  Renal/GU negative Renal ROS     Musculoskeletal   Abdominal   Peds  Hematology CLL   Anesthesia Other Findings   Reproductive/Obstetrics                            Anesthesia Physical Anesthesia Plan  ASA: III  Anesthesia Plan: General   Post-op Pain Management:    Induction: Intravenous  Airway Management Planned: LMA  Additional Equipment:   Intra-op Plan:   Post-operative Plan: Extubation in OR  Informed Consent: I have reviewed the patients History and Physical, chart, labs and discussed the procedure including the risks, benefits and alternatives for the proposed anesthesia with the patient or authorized representative who has indicated his/her understanding and acceptance.   Dental advisory given  Plan Discussed with: CRNA and Anesthesiologist  Anesthesia Plan Comments:        Anesthesia Quick Evaluation

## 2016-12-31 NOTE — Anesthesia Procedure Notes (Signed)
Procedure Name: LMA Insertion Date/Time: 12/31/2016 9:35 AM Performed by: Glory Buff Pre-anesthesia Checklist: Patient identified, Emergency Drugs available, Suction available and Patient being monitored Patient Re-evaluated:Patient Re-evaluated prior to inductionOxygen Delivery Method: Circle system utilized Preoxygenation: Pre-oxygenation with 100% oxygen Intubation Type: IV induction LMA: LMA inserted LMA Size: 4.0 Number of attempts: 1 Placement Confirmation: positive ETCO2 Tube secured with: Tape Dental Injury: Teeth and Oropharynx as per pre-operative assessment

## 2017-01-03 ENCOUNTER — Encounter (HOSPITAL_COMMUNITY): Payer: Self-pay | Admitting: Surgery

## 2017-01-04 ENCOUNTER — Telehealth: Payer: Self-pay | Admitting: Hematology and Oncology

## 2017-01-04 ENCOUNTER — Encounter: Payer: Self-pay | Admitting: Hematology and Oncology

## 2017-01-04 ENCOUNTER — Ambulatory Visit (HOSPITAL_BASED_OUTPATIENT_CLINIC_OR_DEPARTMENT_OTHER): Payer: Medicare Other | Admitting: Hematology and Oncology

## 2017-01-04 VITALS — BP 128/44 | HR 92 | Temp 97.6°F | Resp 16 | Ht 60.0 in | Wt 122.6 lb

## 2017-01-04 DIAGNOSIS — R600 Localized edema: Secondary | ICD-10-CM | POA: Diagnosis not present

## 2017-01-04 DIAGNOSIS — Z7189 Other specified counseling: Secondary | ICD-10-CM

## 2017-01-04 DIAGNOSIS — R634 Abnormal weight loss: Secondary | ICD-10-CM

## 2017-01-04 DIAGNOSIS — C9110 Chronic lymphocytic leukemia of B-cell type not having achieved remission: Secondary | ICD-10-CM | POA: Diagnosis not present

## 2017-01-04 MED ORDER — IBRUTINIB 140 MG PO CAPS: 420.0000 mg | ORAL_CAPSULE | Freq: Every day | ORAL | 11 refills | Status: DC

## 2017-01-04 MED ORDER — ALLOPURINOL 300 MG PO TABS: 300.0000 mg | ORAL_TABLET | Freq: Every day | ORAL | 1 refills | Status: DC

## 2017-01-04 MED ORDER — ACYCLOVIR 400 MG PO TABS: 400.0000 mg | ORAL_TABLET | Freq: Every day | ORAL | 6 refills | Status: DC

## 2017-01-04 NOTE — Progress Notes (Signed)
START ON PATHWAY REGIMEN - Lymphoma and CLL  LYOS247: Ibrutinib 420 mg Daily Until Progression or Unacceptable Toxicity   Administer once daily:     Ibrutinib (Imbruvica(R)) 420 mg flat dose Give three 140mg  capsules orally once daily. Do not open, break, or chew the capsules.  Monthly CBC recommended. Dose Mod: None  **Always confirm dose/schedule in your pharmacy ordering system**    Patient Characteristics: Small Lymphocytic Leukemia (SLL), Second Line, 17p del (+) Disease Type: Small Lymphocytic Leukemia (SLL) Disease Type: Not Applicable [place] Stage: IB Line of therapy: Second Line 17p Deletion Status: Positive  Intent of Therapy: Non-Curative / Palliative Intent, Discussed with Patient

## 2017-01-04 NOTE — Progress Notes (Signed)
Patient on plan of care prior to pathways. 

## 2017-01-04 NOTE — Patient Instructions (Signed)
Ibrutinib capsules What is this medicine? IBRUTINIB (eye BROO ti nib) is a medicine that targets proteins in cancer cells and stops the cancer cells from growing. It is used to treat mantle cell lymphoma, chronic lymphocytic leukemia, small lymphocytic lymphoma, marginal zone lymphoma, and Waldenstrom macroglobulinemia. COMMON BRAND NAME(S): IMBRUVICA What should I tell my health care provider before I take this medicine? They need to know if you have any of these conditions: -bleeding disorders -diabetes -heart disease -high blood pressure -high cholesterol -history of irregular heartbeat -infection -liver disease -recent surgery -smoke tobacco -take medicines that treat or prevent blood clots -an unusual or allergic reaction to ibrutinib, other medicines, foods, dyes, or preservatives -pregnant or trying to get pregnant -breast-feeding How should I use this medicine? Take this medicine by mouth with a glass of water. Follow the directions on the prescription label. Do not cut, crush or chew this medicine. Do not take with grapefruit juice or eat [place] oranges. Take your medicine at regular intervals. Do not take it more often than directed. Do not stop taking except on your doctor's advice. Talk to your pediatrician regarding the use of this medicine in children. Special care may be needed. What if I miss a dose? If you miss a dose, take it as soon as you can. If it is almost time for your next dose, take only that dose. Do not take double or extra doses. What may interact with this medicine? Do not take this medicine with any of the following medications: -boceprevir -bosentan -carbamazepine -certain medicines for fungal infections like ketoconazole, itraconazole, posaconazole, and voriconazole -chloramphenicol -clarithromycin -conivaptan -delavirdine -efavirenz -enzalutamide -grapefruit juice or [place] oranges -indinavir -isoniazid -lanreotide or  octreotide -nefazodone -nelfinavir -nevirapine -nicardipine -phenobarbital -phenytoin -rifampin -ritonavir -saquinavir -[place] oranges -st. john's wort -telaprevir -telithromycin -tipranavir This medicine may also interact with the following medications: -amiodarone -amitriptyline -amprenavir or fosamprenavir -aprepitant or fosaprepitant -atazanavir -bromocriptine -ciprofloxacin -crizotinib -danazol -darunavir -dasatinib -digoxin -diltiazem -erythromycin -fluconazole -fluvoxamine -imatinib -lapatinib -methotrexate -mifepristone, RU-486 -quinine -verapamil -zafirlukast What should I watch for while using this medicine? This drug may make you feel generally unwell. This is not uncommon, as chemotherapy can affect healthy cells as well as cancer cells. Report any side effects. Continue your course of treatment even though you feel ill unless your doctor tells you to stop. Do not become pregnant while taking this medicine or for 1 month after stopping it. Women should inform their doctor if they wish to become pregnant or think they might be pregnant. Men should not father a child while taking this medicine and for 1 month after stopping it. There is a potential for serious side effects to an unborn child. Talk to your health care professional or pharmacist for more information. This medicine may increase your risk to bruise or bleed. Call your doctor or health care professional if you notice any unusual bleeding. If you are going to have surgery or any other procedures, tell your doctor you are taking this medicine. Tell your dentist and dental surgeon that you are taking this medicine. You should not have major dental surgery while on this medicine. See your dentist to have a dental exam and fix any dental problems before starting this medicine. Call your doctor or health care professional for advice if you get a fever, chills or sore throat, or other symptoms of a cold or  flu. Do not treat yourself. This drug decreases your body's ability to fight infections. Try to avoid being around people who are  sick. You may need blood work done while you are taking this medicine. Talk to your doctor about your risk of cancer. You may be more at risk for certain types of cancers if you take this medicine. What side effects may I notice from receiving this medicine? Side effects that you should report to your doctor or health care professional as soon as possible: -allergic reactions like skin rash, itching or hives, swelling of the face, lips, or tongue -low blood counts - this medicine may decrease the number of white blood cells, red blood cells and platelets. You may be at increased risk for infections and bleeding -signs or symptoms of bleeding such as bloody or black, tarry stools; red or dark-brown urine; spitting up blood or brown material that looks like coffee grounds; red spots on the skin; unusual bruising or bleeding from the eye, gums, or nose; confusion; trouble speaking or understanding; severe headaches; weakness; or dizziness -signs and symptoms of a dangerous change in heartbeat or heart rhythm like chest pain; dizziness; fast or irregular heartbeat; palpitations; feeling faint or lightheaded, falls; breathing problems -signs and symptoms of infection like fever or chills; cough; sore throat; or pain when urinating -signs and symptoms of kidney injury like trouble passing urine or change in the amount of urine Side effects that usually do not require medical attention (report to your doctor or health care professional if they continue or are bothersome): -bone pain -diarrhea -muscle pain -nausea -tiredness Where should I keep my medicine? Keep out of the reach of children. Store between 20 and 25 degrees C (68 and 77 degrees F). Keep this medicine in the original container. Throw away any unused medicine after the expiration date.  2017 [employer]  Standard (2016-01-16 13:10:38)

## 2017-01-04 NOTE — Telephone Encounter (Signed)
Appointments scheduled per 1/9 LOS. Patient given AVS report and calendars with future scheduled appointments. °

## 2017-01-05 ENCOUNTER — Telehealth: Payer: Self-pay | Admitting: *Deleted

## 2017-01-05 NOTE — Telephone Encounter (Signed)
New Rx for Ibrutinib was e-scribed to [facility] on [address] by mistake.  Dr. Alvy Bimler then printed new Rx for Oral Chemo Navigator to manage and send to specialty pharmacy. I called [facility] on [address] to cancel Ibrutinib order but they had already sent Rx to their [facility] in [place], [place],- phone #[phone].    Printed Rx for Ibrutinib given to Johny Drilling, Pharmacist for follow up..  Will wait to see if [facility] can fill Rx.

## 2017-01-05 NOTE — Progress Notes (Addendum)
[facility] progress notes  Patient Care Team: Kelton Pillar, MD as PCP - General (Family Medicine)  CHIEF COMPLAINTS/PURPOSE OF VISIT:  CLL, relapsed disease  HISTORY OF PRESENTING ILLNESS:  Brenda Terrell 76 y.o. female was transferred to my care per request from her prior physician I reviewed the patient's records extensive and collaborated the history with the patient. Summary of her history is as follows:   CLL (chronic lymphocytic leukemia) ([place])   11/29/2001 Pathology Results    [phone] Left neck biopsy: LEFT NECK LYMPH NODE, EXCISION: SMALL LYMPHOCYTIC LYMPHOMA/CHRONIC LYMPHOCYTIC LEUKEMIA, SEE COMMENT  COMMENT Sections show effacement of the lymph node architecture by uniform small lymphoid cells displaying chromatin clumping and small to inconspicuous nucleoli. This is associated with numerous proliferation centers (pseudofollicular centers). There is no evidence of high grade transformation. Flow cytometric analysis showed a monoclonal, lambda restricted B cell population expressing pan B cell antigens including CD20 and CD23 with coexpression of CD5 and CD20. 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      01/04/2002 Bone Marrow Biopsy    BONE MARROW, TOUCH IMPRINTS AND CORE BIOPSY: INVOLVEMENT BY SMALL LYMPHOCYTIC LYMPHOMA/CHRONIC LYMPHOCYTIC LEUKEMIA      01/06/2004 Miscellaneous    She was treated for the CLL with Rituxan in 2005/ 2006 and again Jan 2008 thru Feb 2009,with good response, butheld then due to complaints of transient short term memory loss after each RItuxan treatment      10/31/2016 PET scan    Diffuse cervical, axillary, retroperitoneal, and pelvic adenopathy is noted which exhibits very mild increased FDG uptake. I suspect the mild level of increased FDG uptake reflects the inherent hypometabolic state of the tumor as opposed to lack of viable tumor.  2. Brown fat within  bilateral supraclavicular and paraspinal fat.      11/12/2016 Imaging    Ct scan showed significant progression of bulky confluent retroperitoneal and bilateral pelvic lymphadenopathy. Mild progression of thoracic lymphadenopathy involving the bilateral axillary, mediastinal and bilateral hilar chains. 2. New extensive fine perilymphatic distribution nodularity and interlobular septal thickening throughout both lungs, most consistent with lymphangitic tumor, as can be seen in lymphoproliferative disorders. 3. Normal size spleen. 4. Additional findings include aortic atherosclerosis, stable 2.0 cm inferior thyroid isthmus nodule mild sigmoid diverticulosis, mild superior L1 vertebral compression fracture of indeterminate chronicity (new since 03/06/2013), and increased size of moderate midline high ventral abdominal wall hernia containing fat and fluid.      12/31/2016 Pathology Results    Lymph node for lymphoma, left supraclavicular - SMALL LYMPHOCYTIC LYMPHOMA. - SEE ONCOLOGY TABLE. Microscopic Comment LYMPHOMA Histologic type: Non-Hodgkin lymphoma, small lymphocytic type. Grade (if applicable): Low grade. Flow cytometry: Monoclonal, lambda restricted B-cell population expressing pan B-cell antigens including CD20 associated with dim CD5 expression. No CD10 expression is identified ([id number]). Immunohistochemical stains: CD10, CD20, CD3, CD43, CD5, CD79a, and cyclin D1 with appropriate controls. Touch preps/imprints: Predominance of small lymphoid cells with high nuclear cytoplasmic ratio, coarse chromatin and small to inconspicuous nucleoli admixed with larger lymphoid cells to a much lesser extent. Comments: The sections show lymph nodal tissue displaying effacement of the architecture by a lymphoproliferative process characterized by predominance of small lymphoid cells with high nuclear cytoplasmic ratio, round to slightly irregular nuclei, coarse chromatin and small to inconspicuous nucleoli.  This is associated with abundant variably sized proliferation centers (pseudofollicular centers) imparting a vaguely nodular pattern. Flow cytometric analysis shows a monoclonal, lambda restricted B-cell population expressing pan B-cell  antigens associated with dim CD5 expression. In addition, immunohistochemical stains were performed and show that the lymphoid cells are predominantly composed of B cells as seen with CD20 and CD79a associated with co-expression with CD43 and weak co-expression with CD5. No significant CD10 or cyclin D1 positivity is identified. There is admixed T-cell population to a lesser extent as primarily seen with CD3. The findings are consistent with involvement by small lymphocytic lymphoma/chronic lymphocytic leukemia. Clinical correlation is recommended. (BNS:ecj 01/04/2017)      12/31/2016 Surgery    She had excisional biopsy left supraclavicular lymph nodes      She returns after recent left neck biopsy She denies complications from recent surgery She had recent anorexia and weight loss, slowly improving The patient denies any recent signs or symptoms of bleeding such as spontaneous epistaxis, hematuria or hematochezia. She complained of mild bilateral lower extremity edema She has palpable lymphadenopathy She has occasional night sweats but nondrenching  MEDICAL HISTORY:  Past Medical History:  Diagnosis Date  . Arthritis    HIP  . Asthma   . Blood transfusion    POST D&C  . 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 Complication of anesthesia   . GERD (gastroesophageal reflux disease)   . Goiter   . History of migraines   . Mitral valve prolapse   . PONV (postoperative nausea and vomiting)     SURGICAL HISTORY: Past Surgical History:  Procedure Laterality Date  . FOOT SURGERY     bilateral  . LYMPH NODE BIOPSY Left 12/31/2016   Procedure: LYMPH NODE BIOPSY LEFT SUPRACLIAVICULAR;  Surgeon: Clovis Riley, MD;  Location: [facility];  Service: General;  Laterality: Left;  . TONSILLECTOMY      SOCIAL HISTORY: Social History   Social History  . Marital status: Married    Spouse name: Ray  . Number of children: 2  . Years of education: N/A   Occupational History  . retired Regulatory affairs officer    Social History Main Topics  . Smoking status: Never Smoker  . Smokeless tobacco: Never Used  . Alcohol use No  . Drug use: No  . Sexual activity: Not on file   Other Topics Concern  . Not on file   Social History Narrative  . No narrative on file    FAMILY HISTORY: Family History  Problem Relation Age of Onset  . Cancer Mother 62    breast ca  . Cancer Brother     bone cancer    ALLERGIES:  is allergic to gluten meal; influenza virus vacc split pf; nutrasweet aspartame [aspartame]; salicylates cross reactors; and sulfa antibiotics.  MEDICATIONS:  Current Outpatient Prescriptions  Medication Sig Dispense Refill  . Calcium Carbonate-Vitamin D (CALCIUM 600+D) 600-200 MG-UNIT TABS Take 1 tablet by mouth 2 (two) times daily.    . fluticasone (FLONASE) 50 MCG/ACT nasal spray Place 2 sprays into both nostrils daily.    Marland Kitchen guaiFENesin (MUCINEX) 600 MG 12 hr tablet Take by mouth 2 (two) times daily.    Marland Kitchen HYDROcodone-acetaminophen (NORCO/VICODIN) 5-325 MG tablet Take 1 tablet by mouth every 6 (six) hours as needed for moderate pain. 30 tablet 0  . Multiple Vitamin (MULTIVITAMIN WITH MINERALS) TABS tablet Take 1 tablet by mouth daily.    . prednisoLONE acetate (PRED FORTE) 1 % ophthalmic suspension Place 1 drop into the right eye daily.  2  . PROAIR HFA 108 (90 Base) MCG/ACT inhaler Inhale 2  puffs into the lungs every 4 (four) hours as needed for wheezing or shortness of breath. Reported on 05/10/2016  3  . acyclovir (ZOVIRAX) 400 MG tablet Take 1 tablet (400 mg total) by mouth daily. 30 tablet 6  . allopurinol (ZYLOPRIM) 300 MG tablet Take 1 tablet (300 mg total) by mouth daily. 30 tablet  1  . ibrutinib (IMBRUVICA) 140 MG capsul Take 3 capsules (420 mg total) by mouth daily. 90 capsule 11   No current facility-administered medications for this visit.     REVIEW OF SYSTEMS:   Constitutional: Denies fevers, chills or abnormal night sweats Eyes: Denies blurriness of vision, double vision or watery eyes Ears, nose, mouth, throat, and face: Denies mucositis or sore throat Respiratory: Denies cough, dyspnea or wheezes Cardiovascular: Denies palpitation, chest discomfort or lower extremity swelling Gastrointestinal:  Denies nausea, heartburn or change in bowel habits Skin: Denies abnormal skin rashesNeurological:Denies numbness, tingling or new weaknesses Behavioral/Psych: Mood is stable, no new changes  All other systems were reviewed with the patient and are negative.  PHYSICAL EXAMINATION: ECOG PERFORMANCE STATUS: 1 - Symptomatic but completely ambulatory  Vitals:   01/04/17 1356  BP: (!) 128/44  Pulse: 92  Resp: 16  Temp: 97.6 F (36.4 C)   Filed Weights   01/04/17 1356  Weight: 122 lb 9.6 oz (55.6 kg)    GENERAL:alert, no distress and comfortable SKIN: skin color, texture, turgor are normal, no rashes or significant lesions EYES: normal, conjunctiva are pink and non-injected, sclera clear OROPHARYNX:no exudate, normal lips, buccal mucosa, and tongue  NECK: supple, thyroid normal size, non-tender, without nodularity LYMPH: She has diffuse palpable lymphadenopathy in her neck, axillary and inguinal region. LUNGS: clear to auscultation and percussion with normal breathing effort HEART: regular rate & rhythm with soft heart murmurs with bilateral lower extremity edema ABDOMEN:abdomen soft, non-tender and normal bowel sounds. No palpable splenomegaly Musculoskeletal:no cyanosis of digits and no clubbing  PSYCH: alert & oriented x 3 with fluent speech NEURO: no focal motor/sensory deficits  LABORATORY DATA:  I have reviewed the data as listed Lab Results   Component Value Date   WBC 23.0 (H) 12/28/2016   HGB 12.5 12/28/2016   HCT 37.6 12/28/2016   MCV 97.2 12/28/2016   PLT 151 12/28/2016    Recent Labs  07/05/16 0913 11/08/16 0909 11/22/16 1027 12/28/16 1028  NA 143 141 143 141  K 4.0 4.5 4.0 5.3*  CL  --   --   --  105  CO2 _0 GLUCOSE 80 96 69* 89  BUN 16.8 14.6 20.0 22*  CREATININE 0.9 0.8 0.9 0.65  CALCIUM 9.4 9.6 9.8 9.7  GFRNONAA  --   --   --  >60  GFRAA  --   --   --  >60  PROT 6.3* 6.2* 6.4  --   ALBUMIN 3.9 3.3* 3.6  --   AST 53* 42* 49*  --   ALT _1 --   ALKPHOS 80 107 112  --   BILITOT 0.59 0.52 0.52  --     RADIOGRAPHIC STUDIES:I reviewed multiple imaging studies I have personally reviewed the radiological images as listed and agreed with the findings in the report.   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 It is a category 1 recommendation from NCCN.  Ibrutinib versus Ofatumumab in Previously Treated Chronic Lymphoid Leukemia Gasper Lloyd, M.D., Edmonia Lynch. Owens Shark, M.D., Ph.D., Douglass Rivers, M.D., Link Snuffer, M.D., Jeanne Ivan, M.D., Serita Sheller, M.B., B.S., Lauris Chroman, M.D., Hilary Hertz. Marti Sleigh, M.D., Macarthur Critchley, M.B., B.S., Ph.D., Chrisandra Carota, M.D., Milly Jakob, M.D., Carey Bullocks. Aris Lot, M.D., Jeanine Luz. Valera Castle, M.D., Mahala Menghini, M.D., Yvonne Kendall, M.D., Harland German, M.D., Ph.D., Laury Deep, M.D., Evelena Leyden, M.D., Assunta Gambles, M.D., Esau Grew, M.D., Wyatt Mage, M.D., Joyce Gross,  M.D., Olena Mater, M.D., Minda Ditto, M.D., William Dalton, M.D., Stark Klein, Ph.D., F.R.C.Path., Carmelina Peal, M.D., Neal Dy, M.D., Gentry Fitz, M.D., Vida Rigger. Sherrian Divers, M.D., Lenoard Aden, Ph.D., Earnestine Mealing, M.D., Oretha Caprice, D.[place]., Lowry Ram, M.D., and Raford Pitcher, M.B., Ch.B., Ph.D., for the [employer]* [facility] Med [phone] 17, 2014DOI: 10.1056/[id number]  The chemotherapy consists of Ibrutinib, a Bruton's tyrosine kinase inhibitor. We discussed the role of chemotherapy. The intent is for palliative.   Ibrutinib is taken as a single agent oral treatment indefinitely until unexpected side-effects or lack of response.  In this multicenter, open-label, phase 3 study, we randomly assigned 391 patients with relapsed or refractory CLL or SLL to receive daily ibrutinib or the anti-CD20 antibody ofatumumab. The primary end point was the duration of progression-free survival, with the duration of overall survival and the overall response rate as secondary end points.  At a median follow-up of 9.4 months, ibrutinib significantly improved progression-free survival; the median duration was not reached in the ibrutinib group (with a rate of progression-free survival of 88% at 6 months), as compared with a median of 8.1 months in the ofatumumab group (hazard ratio for progression or death in the ibrutinib group, 0.22; P<0.001). Ibrutinib also significantly improved overall survival (hazard ratio for death, 0.43; P=0.005). At 12 months, the overall survival rate was 90% in the ibrutinib group and 81% in the ofatumumab group. The overall response rate was significantly higher in the ibrutinib group than in the ofatumumab group (42.6% vs. 4.1%, P<0.001). An additional 20% of ibrutinib-treated patients had a partial response with lymphocytosis. Similar effects were observed regardless of whether patients had a chromosome 17p13.1 deletion or resistance to purine analogues.    The most frequent nonhematologic adverse events were diarrhea, fatigue, pyrexia, and nausea in the ibrutinib group and fatigue, infusion-related reactions, and cough in the ofatumumab group.  Some of the short term side-effects included, though not limited to, risk of fatigue, weight loss, tumor lysis syndrome, risk of allergic reactions, pancytopenia, bruising, diarrhea, transient leukocytosis, life-threatening infections, need for transfusions of blood products, irregular heart beat, nausea, vomiting, change in bowel habits, admission to hospital for various reasons, and risks of death.   The patient is aware that the response rates discussed earlier is not guaranteed.    After a long discussion, patient made an informed decision to proceed with the prescribed plan of care.  Patient is aware it may take up to 10 days to get medication approved and delivered I recommend she starts treatment around 1/22. I will start her on allopurinol for tumor lysis prophylaxis and acyclovir for antimicrobial prophylaxis I will see her back at the end of the month to assess response to treatment  Unintentional weight loss This is related to disease progression According to her, she is eating better and has not lost further weight  Goals of care, counseling/discussion The patient is aware she has incurable disease and treatment is strictly palliative. We discussed importance of Advanced Directives and Living will. I will get assistance from our social worker to help her fill out some paperwork. We discussed CODE STATUS; the patient desires to remain in full code.  Bilateral leg edema Likely due to impaired venous drainage from into intra-abdominal lymphadenopathy. Recommend observation only   Orders Placed This Encounter  Procedures  . CBC with Differential/Platelet    Standing Status:   Standing    Number of Occurrences:   22    Standing Expiration Date:   01/04/2018  . Comprehensive  metabolic panel    Standing Status:   Standing    Number of Occurrences:   22    Standing Expiration Date:   01/04/2018  . Lactate dehydrogenase    Standing Status:   Standing    Number of Occurrences:   22    Standing Expiration Date:   01/04/2018  . Uric acid    Standing Status:   Standing    Number of Occurrences:   9    Standing Expiration Date:   01/04/2018    All questions were answered. The patient knows to call the clinic with any problems, questions or concerns. I spent 30 minutes counseling the patient face to face. The total time spent in the appointment was 55 minutes and more than 50% was on counseling.     Heath Lark, MD 01/06/2017 3:24 PM

## 2017-01-05 NOTE — Assessment & Plan Note (Signed)
This is related to disease progression According to her, she is eating better and has not lost further weight

## 2017-01-05 NOTE — Telephone Encounter (Signed)
"  [facility] calling to let you know we received Imbruvica order for this patient.  We're not contracted to dispense this medicine.  The two pharmacies that are contracted for Imbrivica are 1. [place] Ph: [phone] or 2. [place] Ph: [phone]."

## 2017-01-05 NOTE — Assessment & Plan Note (Signed)
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 It is a category 1 recommendation from NCCN.  Ibrutinib versus Ofatumumab in Previously Treated Chronic Lymphoid Leukemia Gasper Lloyd, M.D., Edmonia Lynch. Owens Shark, M.D., Ph.D., Douglass Rivers, M.D., Link Snuffer, M.D., Jeanne Ivan, M.D., Serita Sheller, M.B., B.S., Lauris Chroman, M.D., Hilary Hertz. Marti Sleigh, M.D., Macarthur Critchley, M.B., B.S., Ph.D., Chrisandra Carota, M.D., Milly Jakob, M.D., Carey Bullocks. Aris Lot, M.D., Jeanine Luz. Valera Castle, M.D., Mahala Menghini, M.D., Yvonne Kendall, M.D., Harland German, M.D., Ph.D., Laury Deep, M.D., Evelena Leyden, M.D., Assunta Gambles, M.D., Esau Grew, M.D., Wyatt Mage, M.D., Joyce Gross, M.D., Olena Mater, M.D., Minda Ditto, M.D., William Dalton, M.D., Stark Klein, Ph.D., F.R.C.Path., Carmelina Peal, M.D., Neal Dy, M.D., Gentry Fitz, M.D., Vida Rigger. Sherrian Divers, M.D., Lenoard Aden, Ph.D., Earnestine Mealing, M.D., Oretha Caprice, D.[place]., Lowry Ram, M.D., and Raford Pitcher, M.B., Ch.B., Ph.D., for the [employer]* [facility] Med [phone] 17, 2014DOI: 10.1056/[id number]  The chemotherapy consists of Ibrutinib, a Bruton's tyrosine kinase inhibitor. We discussed the role of chemotherapy. The intent is for palliative.   Ibrutinib is taken as a single agent oral treatment indefinitely until unexpected side-effects or lack of response.  In this multicenter, open-label, phase 3 study, we randomly assigned 391 patients with relapsed or refractory CLL or SLL to receive daily ibrutinib or the anti-CD20  antibody ofatumumab. The primary end point was the duration of progression-free survival, with the duration of overall survival and the overall response rate as secondary end points.  At a median follow-up of 9.4 months, ibrutinib significantly improved progression-free survival; the median duration was not reached in the ibrutinib group (with a rate of progression-free survival of 88% at 6 months), as compared with a median of 8.1 months in the ofatumumab group (hazard ratio for progression or death in the ibrutinib group, 0.22; P<0.001). Ibrutinib also significantly improved overall survival (hazard ratio for death, 0.43; P=0.005). At 12 months, the overall survival rate was 90% in the ibrutinib group and 81% in the ofatumumab group. The overall response rate was significantly higher in the ibrutinib group than in the ofatumumab group (42.6% vs. 4.1%, P<0.001). An additional 20% of ibrutinib-treated patients had a partial response with lymphocytosis. Similar effects were observed regardless of whether patients had a chromosome 17p13.1 deletion or resistance to purine analogues.   The most frequent nonhematologic adverse events were diarrhea, fatigue, pyrexia, and nausea in the ibrutinib group and fatigue, infusion-related reactions, and cough in the ofatumumab group.  Some of the short term side-effects included, though not limited to, risk of fatigue, weight loss, tumor lysis syndrome, risk of allergic reactions, pancytopenia, bruising, diarrhea, transient leukocytosis, life-threatening infections, need for transfusions of blood products, irregular heart beat, nausea, vomiting, change in bowel habits, admission to hospital for various reasons, and risks of death.   The patient is aware that the response rates discussed earlier is not guaranteed.    After a long discussion, patient made an informed decision to proceed with the prescribed plan of care.  Patient is aware it may take up to 10 days to get  medication approved and delivered I recommend she starts treatment around 1/22.  I will start her on allopurinol for tumor lysis prophylaxis and acyclovir for antimicrobial prophylaxis I will see her back at the end of the month to assess response to treatment

## 2017-01-06 ENCOUNTER — Telehealth: Payer: Self-pay | Admitting: Pharmacist

## 2017-01-06 DIAGNOSIS — Z7189 Other specified counseling: Secondary | ICD-10-CM | POA: Insufficient documentation

## 2017-01-06 DIAGNOSIS — C9110 Chronic lymphocytic leukemia of B-cell type not having achieved remission: Secondary | ICD-10-CM

## 2017-01-06 DIAGNOSIS — R600 Localized edema: Secondary | ICD-10-CM | POA: Insufficient documentation

## 2017-01-06 MED ORDER — IBRUTINIB 140 MG PO CAPS: 420.0000 mg | ORAL_CAPSULE | Freq: Every day | ORAL | 11 refills | Status: DC

## 2017-01-06 NOTE — Telephone Encounter (Signed)
Oral Chemotherapy Pharmacist Encounter  Received new prescription for Imbruvica for patient with CLL. Patient has been previously treated. 11/12/16 CMET reviewed, 1/218 BMET and CBC reviewed, all ok for treatment. No cardiac history noted. Current medication list in Epic assessed, no DDIs with Imbruvica identified.  [facility] note on 1/10, [facility] but [facility] can [facility] prescription to [facility].  I called [facility] at [phone], we will fax prescription, demographic info, insurance info, and last MD note to [phone].  [facility] [address] [facility] insurance information since we do not have that information on file. They provided me with billing info: ID: [id number] BIN: [id number] PCN: [id number] Group: [id number]  I attempted to initiate prior authorization on [email or web address] however was instructed to call [facility] at [phone] for PA. PA initiated over the phone, status is approved Case ID: [id number] Effective dates: 01/05/17-01/06/2020  Oral Oncology Clinic will continue to follow.  Johny Drilling, PharmD, BCPS, BCOP 01/06/2017  10:37 AM Oral Oncology Clinic [phone]

## 2017-01-06 NOTE — Telephone Encounter (Signed)
Call from pt requesting update on prescription. Informed her it has been sent to [place], informed her she should hear from pharmacy soon.

## 2017-01-06 NOTE — Assessment & Plan Note (Signed)
The patient is aware she has incurable disease and treatment is strictly palliative. We discussed importance of Advanced Directives and Living will. I will get assistance from our social worker to help her fill out some paperwork. We discussed CODE STATUS; the patient desires to remain in full code. 

## 2017-01-06 NOTE — Assessment & Plan Note (Signed)
Likely due to impaired venous drainage from into intra-abdominal lymphadenopathy. Recommend observation only

## 2017-01-20 DIAGNOSIS — H20041 Secondary noninfectious iridocyclitis, right eye: Secondary | ICD-10-CM | POA: Diagnosis not present

## 2017-01-20 DIAGNOSIS — H401131 Primary open-angle glaucoma, bilateral, mild stage: Secondary | ICD-10-CM | POA: Diagnosis not present

## 2017-01-24 ENCOUNTER — Other Ambulatory Visit (HOSPITAL_BASED_OUTPATIENT_CLINIC_OR_DEPARTMENT_OTHER): Payer: Medicare Other

## 2017-01-24 ENCOUNTER — Encounter: Payer: Self-pay | Admitting: Hematology and Oncology

## 2017-01-24 ENCOUNTER — Telehealth: Payer: Self-pay | Admitting: Hematology and Oncology

## 2017-01-24 ENCOUNTER — Ambulatory Visit (HOSPITAL_BASED_OUTPATIENT_CLINIC_OR_DEPARTMENT_OTHER): Payer: Medicare Other | Admitting: Hematology and Oncology

## 2017-01-24 ENCOUNTER — Encounter: Payer: Self-pay | Admitting: *Deleted

## 2017-01-24 VITALS — BP 142/53 | HR 81 | Temp 98.1°F | Resp 18 | Ht 60.0 in | Wt 121.4 lb

## 2017-01-24 DIAGNOSIS — C9110 Chronic lymphocytic leukemia of B-cell type not having achieved remission: Secondary | ICD-10-CM | POA: Diagnosis not present

## 2017-01-24 DIAGNOSIS — D696 Thrombocytopenia, unspecified: Secondary | ICD-10-CM | POA: Diagnosis not present

## 2017-01-24 DIAGNOSIS — H20041 Secondary noninfectious iridocyclitis, right eye: Secondary | ICD-10-CM | POA: Diagnosis not present

## 2017-01-24 DIAGNOSIS — H401131 Primary open-angle glaucoma, bilateral, mild stage: Secondary | ICD-10-CM | POA: Diagnosis not present

## 2017-01-24 DIAGNOSIS — D72820 Lymphocytosis (symptomatic): Secondary | ICD-10-CM

## 2017-01-24 LAB — CBC WITH DIFFERENTIAL/PLATELET
BASO%: 0.3 % (ref 0.0–2.0)
BASOS ABS: 0.1 10*3/uL (ref 0.0–0.1)
EOS%: 0.8 % (ref 0.0–7.0)
Eosinophils Absolute: 0.2 10*3/uL (ref 0.0–0.5)
HCT: 35.9 % (ref 34.8–46.6)
HEMOGLOBIN: 11.7 g/dL (ref 11.6–15.9)
LYMPH%: 90.6 % — AB (ref 14.0–49.7)
MCH: 32.3 pg (ref 25.1–34.0)
MCHC: 32.7 g/dL (ref 31.5–36.0)
MCV: 98.7 fL (ref 79.5–101.0)
MONO#: 0.5 10*3/uL (ref 0.1–0.9)
MONO%: 2.7 % (ref 0.0–14.0)
NEUT%: 5.6 % — ABNORMAL LOW (ref 38.4–76.8)
NEUTROS ABS: 1.1 10*3/uL — AB (ref 1.5–6.5)
Platelets: 117 10*3/uL — ABNORMAL LOW (ref 145–400)
RBC: 3.64 10*6/uL — ABNORMAL LOW (ref 3.70–5.45)
RDW: 17.6 % — AB (ref 11.2–14.5)
WBC: 20.2 10*3/uL — AB (ref 3.9–10.3)
lymph#: 18.3 10*3/uL — ABNORMAL HIGH (ref 0.9–3.3)

## 2017-01-24 LAB — URIC ACID: Uric Acid, Serum: 2.7 mg/dl (ref 2.6–7.4)

## 2017-01-24 LAB — COMPREHENSIVE METABOLIC PANEL
ALBUMIN: 3.8 g/dL (ref 3.5–5.0)
ALK PHOS: 149 U/L (ref 40–150)
ALT: 26 U/L (ref 0–55)
AST: 50 U/L — AB (ref 5–34)
Anion Gap: 9 mEq/L (ref 3–11)
BUN: 15.4 mg/dL (ref 7.0–26.0)
CO2: 29 mEq/L (ref 22–29)
Calcium: 10.2 mg/dL (ref 8.4–10.4)
Chloride: 106 mEq/L (ref 98–109)
Creatinine: 0.8 mg/dL (ref 0.6–1.1)
EGFR: 87 mL/min/{1.73_m2} — AB (ref >90)
GLUCOSE: 96 mg/dL (ref 70–140)
POTASSIUM: 4.7 meq/L (ref 3.5–5.1)
SODIUM: 144 meq/L (ref 136–145)
Total Bilirubin: 0.66 mg/dL (ref 0.20–1.20)
Total Protein: 6 g/dL — ABNORMAL LOW (ref 6.4–8.3)

## 2017-01-24 LAB — LACTATE DEHYDROGENASE: LDH: 235 U/L (ref 125–245)

## 2017-01-24 LAB — TECHNOLOGIST REVIEW

## 2017-01-24 MED ORDER — PREDNISONE 20 MG PO TABS: 20.0000 mg | ORAL_TABLET | Freq: Every day | ORAL | 1 refills | Status: DC

## 2017-01-24 NOTE — Telephone Encounter (Signed)
Appointments scheduled per 01/24/17 los. Patient was given a copy of the appointment schedule and AVS report, per 01/24/17 los. °

## 2017-01-24 NOTE — Progress Notes (Signed)
[facility] OFFICE PROGRESS NOTE  Patient Care Team: Kelton Pillar, MD as PCP - General (Family Medicine)  SUMMARY OF ONCOLOGIC HISTORY:   CLL (chronic lymphocytic leukemia) ([place])   11/29/2001 Pathology Results    [phone] Left neck biopsy: LEFT NECK LYMPH NODE, EXCISION: SMALL LYMPHOCYTIC LYMPHOMA/CHRONIC LYMPHOCYTIC LEUKEMIA, SEE COMMENT  COMMENT Sections show effacement of the lymph node architecture by uniform small lymphoid cells displaying chromatin clumping and small to inconspicuous nucleoli. This is associated with numerous proliferation centers (pseudofollicular centers). There is no evidence of high grade transformation. Flow cytometric analysis showed a monoclonal, lambda restricted B cell population expressing pan B cell antigens including CD20 and CD23 with coexpression of CD5 and CD20. 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      01/04/2002 Bone Marrow Biopsy    BONE MARROW, TOUCH IMPRINTS AND CORE BIOPSY: INVOLVEMENT BY SMALL LYMPHOCYTIC LYMPHOMA/CHRONIC LYMPHOCYTIC LEUKEMIA      01/06/2004 Miscellaneous    She was treated for the CLL with Rituxan in 2005/ 2006 and again Jan 2008 thru Feb 2009,with good response, butheld then due to complaints of transient short term memory loss after each RItuxan treatment      10/31/2016 PET scan    Diffuse cervical, axillary, retroperitoneal, and pelvic adenopathy is noted which exhibits very mild increased FDG uptake. I suspect the mild level of increased FDG uptake reflects the inherent hypometabolic state of the tumor as opposed to lack of viable tumor.  2. Brown fat within bilateral supraclavicular and paraspinal fat.      11/12/2016 Imaging    Ct scan showed significant progression of bulky confluent retroperitoneal and bilateral pelvic lymphadenopathy. Mild progression of thoracic lymphadenopathy involving the bilateral axillary, mediastinal and bilateral  hilar chains. 2. New extensive fine perilymphatic distribution nodularity and interlobular septal thickening throughout both lungs, most consistent with lymphangitic tumor, as can be seen in lymphoproliferative disorders. 3. Normal size spleen. 4. Additional findings include aortic atherosclerosis, stable 2.0 cm inferior thyroid isthmus nodule mild sigmoid diverticulosis, mild superior L1 vertebral compression fracture of indeterminate chronicity (new since 03/06/2013), and increased size of moderate midline high ventral abdominal wall hernia containing fat and fluid.      12/31/2016 Pathology Results    Lymph node for lymphoma, left supraclavicular - SMALL LYMPHOCYTIC LYMPHOMA. - SEE ONCOLOGY TABLE. Microscopic Comment LYMPHOMA Histologic type: Non-Hodgkin lymphoma, small lymphocytic type. Grade (if applicable): Low grade. Flow cytometry: Monoclonal, lambda restricted B-cell population expressing pan B-cell antigens including CD20 associated with dim CD5 expression. No CD10 expression is identified ([id number]). Immunohistochemical stains: CD10, CD20, CD3, CD43, CD5, CD79a, and cyclin D1 with appropriate controls. Touch preps/imprints: Predominance of small lymphoid cells with high nuclear cytoplasmic ratio, coarse chromatin and small to inconspicuous nucleoli admixed with larger lymphoid cells to a much lesser extent. Comments: The sections show lymph nodal tissue displaying effacement of the architecture by a lymphoproliferative process characterized by predominance of small lymphoid cells with high nuclear cytoplasmic ratio, round to slightly irregular nuclei, coarse chromatin and small to inconspicuous nucleoli. This is associated with abundant variably sized proliferation centers (pseudofollicular centers) imparting a vaguely nodular pattern. Flow cytometric analysis shows a monoclonal, lambda restricted B-cell population expressing pan B-cell antigens associated with dim CD5 expression. In addition,  immunohistochemical stains were performed and show that the lymphoid cells are predominantly composed of B cells as seen with CD20 and CD79a associated with co-expression with CD43 and weak co-expression with CD5. No significant CD10 or cyclin D1 positivity  is identified. There is admixed T-cell population to a lesser extent as primarily seen with CD3. The findings are consistent with involvement by small lymphocytic lymphoma/chronic lymphocytic leukemia. Clinical correlation is recommended. (BNS:ecj 01/04/2017)      12/31/2016 Surgery    She had excisional biopsy left supraclavicular lymph nodes       INTERVAL HISTORY: Please see below for problem oriented charting. She returns today with her husband. The patient is very distressed as she was not able to get Ibrutinib medication yet. She denies progression of lymphadenopathy. No recent infection The patient denies any recent signs or symptoms of bleeding such as spontaneous epistaxis, hematuria or hematochezia.   REVIEW OF SYSTEMS:   Constitutional: Denies fevers, chills or abnormal weight loss Eyes: Denies blurriness of vision Ears, nose, mouth, throat, and face: Denies mucositis or sore throat Respiratory: Denies cough, dyspnea or wheezes Cardiovascular: Denies palpitation, chest discomfort or lower extremity swelling Gastrointestinal:  Denies nausea, heartburn or change in bowel habits Skin: Denies abnormal skin rashes Lymphatics: Denies new lymphadenopathy or easy bruising Neurological:Denies numbness, tingling or new weaknesses Behavioral/Psych: Mood is stable, no new changes  All other systems were reviewed with the patient and are negative.  I have reviewed the past medical history, past surgical history, social history and family history with the patient and they are unchanged from previous note.  ALLERGIES:  is allergic to gluten meal; influenza virus vacc split pf; nutrasweet aspartame [aspartame]; salicylates cross reactors;  and sulfa antibiotics.  MEDICATIONS:  Current Outpatient Prescriptions  Medication Sig Dispense Refill  . acyclovir (ZOVIRAX) 400 MG tablet Take 1 tablet (400 mg total) by mouth daily. 30 tablet 6  . allopurinol (ZYLOPRIM) 300 MG tablet Take 1 tablet (300 mg total) by mouth daily. 30 tablet 1  . Calcium Carbonate-Vitamin D (CALCIUM 600+D) 600-200 MG-UNIT TABS Take 1 tablet by mouth 2 (two) times daily.    . fluticasone (FLONASE) 50 MCG/ACT nasal spray Place 2 sprays into both nostrils daily.    . Multiple Vitamin (MULTIVITAMIN WITH MINERALS) TABS tablet Take 1 tablet by mouth daily.    . prednisoLONE acetate (PRED FORTE) 1 % ophthalmic suspension Place 1 drop into the right eye daily.  2  . PROAIR HFA 108 (90 Base) MCG/ACT inhaler Inhale 2 puffs into the lungs every 4 (four) hours as needed for wheezing or shortness of breath. Reported on 05/10/2016  3  . guaiFENesin (MUCINEX) 600 MG 12 hr tablet Take by mouth 2 (two) times daily.    Marland Kitchen HYDROcodone-acetaminophen (NORCO/VICODIN) 5-325 MG tablet Take 1 tablet by mouth every 6 (six) hours as needed for moderate pain. (Patient not taking: Reported on 01/24/2017) 30 tablet 0  . ibrutinib (IMBRUVICA) 140 MG capsul Take 3 capsules (420 mg total) by mouth daily. (Patient not taking: Reported on 01/24/2017) 90 capsule 11  . predniSONE (DELTASONE) 20 MG tablet Take 1 tablet (20 mg total) by mouth daily with breakfast. 30 tablet 1   No current facility-administered medications for this visit.     PHYSICAL EXAMINATION: ECOG PERFORMANCE STATUS: 1 - Symptomatic but completely ambulatory  Vitals:   01/24/17 1118  BP: (!) 142/53  Pulse: 81  Resp: 18  Temp: 98.1 F (36.7 C)   Filed Weights   01/24/17 1118  Weight: 121 lb 6.4 oz (55.1 kg)    GENERAL:alert, no distress and comfortable SKIN: skin color, texture, turgor are normal, no rashes or significant lesions EYES: normal, Conjunctiva are pink and non-injected, sclera clear Musculoskeletal:no  cyanosis of  digits and no clubbing  NEURO: alert & oriented x 3 with fluent speech, no focal motor/sensory deficits  LABORATORY DATA:  I have reviewed the data as listed    Component Value Date/Time   NA 144 01/24/2017 1103   K 4.7 01/24/2017 1103   CL 105 12/28/2016 1028   CL 106 03/21/2013 1302   CO2 29 01/24/2017 1103   GLUCOSE 96 01/24/2017 1103   GLUCOSE 87 03/21/2013 1302   BUN 15.4 01/24/2017 1103   CREATININE 0.8 01/24/2017 1103   CALCIUM 10.2 01/24/2017 1103   PROT 6.0 (L) 01/24/2017 1103   ALBUMIN 3.8 01/24/2017 1103   AST 50 (H) 01/24/2017 1103   ALT 26 01/24/2017 1103   ALKPHOS 149 01/24/2017 1103   BILITOT 0.66 01/24/2017 1103   GFRNONAA >60 12/28/2016 1028   GFRAA >60 12/28/2016 1028    No results found for: SPEP, UPEP  Lab Results  Component Value Date   WBC 20.2 (H) 01/24/2017   NEUTROABS 1.1 (L) 01/24/2017   HGB 11.7 01/24/2017   HCT 35.9 01/24/2017   MCV 98.7 01/24/2017   PLT 117 (L) 01/24/2017      Chemistry      Component Value Date/Time   NA 144 01/24/2017 1103   K 4.7 01/24/2017 1103   CL 105 12/28/2016 1028   CL 106 03/21/2013 1302   CO2 29 01/24/2017 1103   BUN 15.4 01/24/2017 1103   CREATININE 0.8 01/24/2017 1103      Component Value Date/Time   CALCIUM 10.2 01/24/2017 1103   ALKPHOS 149 01/24/2017 1103   AST 50 (H) 01/24/2017 1103   ALT 26 01/24/2017 1103   BILITOT 0.66 01/24/2017 1103      ASSESSMENT & PLAN:  CLL (chronic lymphocytic leukemia) ([place]) Unfortunately, we are not able to get her medication approved. The patient is very distressed. Blood work show evidence of lymphocytosis with associated thrombocytopenia. According to documentation by our pharmacist, her medication was approved but we will see if we can get her assistant to help pay for her medication. In the meantime, I recommend low-dose prednisone therapy at 20 mg daily. We discussed briefly expected side effects from prednisone including risks of insomnia,  increased appetite and gastritis. I plan to see her back in 3 weeks for further assessment. I also spent some time helping her fill out information to get her assistance for medication  Thrombocytopenia (HCC) There is little doubt in my mind that the thrombocytopenia is associated with disease progression. We'll start her on prednisone therapy while awaiting for medication approval.   No orders of the defined types were placed in this encounter.  All questions were answered. The patient knows to call the clinic with any problems, questions or concerns. No barriers to learning was detected. I spent 15 minutes counseling the patient face to face. The total time spent in the appointment was 20 minutes and more than 50% was on counseling and review of test results     Heath Lark, MD 01/24/2017 12:08 PM

## 2017-01-24 NOTE — Assessment & Plan Note (Signed)
There is little doubt in my mind that the thrombocytopenia is associated with disease progression. We'll start her on prednisone therapy while awaiting for medication approval.

## 2017-01-24 NOTE — Assessment & Plan Note (Signed)
Unfortunately, we are not able to get her medication approved. The patient is very distressed. Blood work show evidence of lymphocytosis with associated thrombocytopenia. According to documentation by our pharmacist, her medication was approved but we will see if we can get her assistant to help pay for her medication. In the meantime, I recommend low-dose prednisone therapy at 20 mg daily. We discussed briefly expected side effects from prednisone including risks of insomnia, increased appetite and gastritis. I plan to see her back in 3 weeks for further assessment. I also spent some time helping her fill out information to get her assistance for medication

## 2017-01-27 NOTE — Progress Notes (Signed)
[facility] Healthcare Advance Directives Clinical Social Work  Clinical Social Work was referred by patient to review and complete healthcare advance directives.  Clinical Social Worker met with patient and spouse in CSW office.  The patient designated spouse Ray Nuckles as their primary healthcare agent and daughter Saundra DeLauder as their secondary agent.  Patient also completed healthcare living will.    Clinical Social Worker notarized documents and made copies for patient/family. Clinical Social Worker will send documents to medical records to be scanned into patient's chart. Clinical Social Worker encouraged patient/family to contact with any additional questions or concerns.  Lauren Mullis, MSW, LCSW Clinical Social Worker [facility] [phone]        

## 2017-01-27 NOTE — Telephone Encounter (Signed)
Oral Chemotherapy Pharmacist Encounter  Received notification from Dr. Alvy Bimler that patient is unable to afford copayment for ibrutinib.  I called patient, she stated [facility] had quoted copayment of $2000 and has referred her to apply for manufacturer assistance from [place] ([employer]PA[employer]).  I called [employer] at [phone] to inquire if they had tried to obtain foundation grant assistance. They stated patient's income is too high for LLS and they did not identify any other open foundation grants, so patient was referred to manufacturer.  I met patient in lobby to complete her [employer]PA[employer] application. Application will be faxed to [phone]. [employer] phone number for follow-up: [phone]  I am concerned patient's income is too high to qualify for manufacturer assistance, this will be discussed with MD.  This encounter will continue to be updated until final determination.  Oral Oncology Clinic will continue to follow.   Johny Drilling, PharmD, BCPS, BCOP 01/27/2017  8:47 AM Oral Oncology Clinic [phone]

## 2017-02-02 ENCOUNTER — Ambulatory Visit (HOSPITAL_BASED_OUTPATIENT_CLINIC_OR_DEPARTMENT_OTHER): Payer: Medicare Other | Admitting: Hematology and Oncology

## 2017-02-02 ENCOUNTER — Telehealth: Payer: Self-pay | Admitting: Hematology and Oncology

## 2017-02-02 VITALS — BP 153/53 | HR 89 | Temp 97.7°F | Resp 17 | Ht 60.0 in | Wt 121.3 lb

## 2017-02-02 DIAGNOSIS — R600 Localized edema: Secondary | ICD-10-CM

## 2017-02-02 DIAGNOSIS — R634 Abnormal weight loss: Secondary | ICD-10-CM

## 2017-02-02 DIAGNOSIS — C9110 Chronic lymphocytic leukemia of B-cell type not having achieved remission: Secondary | ICD-10-CM | POA: Diagnosis not present

## 2017-02-02 DIAGNOSIS — Z7189 Other specified counseling: Secondary | ICD-10-CM

## 2017-02-02 NOTE — Patient Instructions (Signed)
Obinutuzumab injection What is this medicine? OBINUTUZUMAB (OH bi nue TOOZ ue mab) is a monoclonal antibody. It is used to treat chronic lymphocytic leukemia (CLL) and a type of non-Hodgkin lymphoma (NHL), follicular lymphoma. COMMON BRAND NAME(S): GAZYVA What should I tell my health care provider before I take this medicine? They need to know if you have any of these conditions: -infection (especially a virus infection such as hepatitis B virus) -lung or breathing disease -heart disease -take medicines that treat or prevent blood clots -an unusual or allergic reaction to obinutuzumab, other medicines, foods, dyes, or preservatives -pregnant or trying to get pregnant -breast-feeding How should I use this medicine? This medicine is for infusion into a vein. It is given by a health care professional in a hospital or clinic setting. Talk to your pediatrician regarding the use of this medicine in children. Special care may be needed. What if I miss a dose? Keep appointments for follow-up doses as directed. It is important not to miss your dose. Call your doctor or health care professional if you are unable to keep an appointment. What may interact with this medicine? -live virus vaccines What should I watch for while using this medicine? Report any side effects that you notice during your treatment right away, such as changes in your breathing, fever, chills, dizziness or lightheadedness. These effects are more common with the first dose. Visit your prescriber or health care professional for checks on your progress. You will need to have regular blood work. Report any other side effects. The side effects of this medicine can continue after you finish your treatment. Continue your course of treatment even though you feel ill unless your doctor tells you to stop. Call your doctor or health care professional for advice if you get a fever, chills or sore throat, or other symptoms of a cold or flu. Do  not treat yourself. This drug decreases your body's ability to fight infections. Try to avoid being around people who are sick. This medicine may increase your risk to bruise or bleed. Call your doctor or health care professional if you notice any unusual bleeding. What side effects may I notice from receiving this medicine? Side effects that you should report to your doctor or health care professional as soon as possible: -allergic reactions like skin rash, itching or hives, swelling of the face, lips, or tongue -breathing problems -changes in vision -chest pain or chest tightness -confusion -dizziness -loss of balance or coordination -low blood counts - this medicine may decrease the number of white blood cells, red blood cells and platelets. You may be at increased risk for infections and bleeding. -signs of decreased platelets or bleeding - bruising, pinpoint red spots on the skin, black, tarry stools, blood in the urine -signs of infection - fever or chills, cough, sore throat, pain or trouble passing urine -signs and symptoms of liver injury like dark yellow or brown urine; general ill feeling or flu-like symptoms; light-colored stools; loss of appetite; nausea; right upper belly pain; unusually weak or tired; yellowing of the eyes or skin -trouble speaking or understanding -trouble walking -vomiting Side effects that usually do not require medical attention (report to your doctor or health care professional if they continue or are bothersome): -constipation -joint pain -muscle pain Where should I keep my medicine? This drug is only given in a hospital or clinic and will not be stored at home.  2017 Elsevier/Gold Standard (2016-01-15 08:54:03)  

## 2017-02-02 NOTE — Telephone Encounter (Signed)
Chemo request given to [facility] D. to add, per 02/02/17 los. Appointments scheduled per 02/02/17 los. Patient was given a copy of the AVS report and appointment schedule, per 02/02/17 los.

## 2017-02-02 NOTE — Progress Notes (Signed)
DISCONTINUE ON PATHWAY REGIMEN - Lymphoma and CLL  [id number]: Ibrutinib 420 mg Daily Until Progression or Unacceptable Toxicity   Administer once daily:     Ibrutinib (Imbruvica(R)) 420 mg flat dose Give three 140mg  capsules orally once daily. Do not open, break, or chew the capsules.  Monthly CBC recommended. Dose Mod: None  **Always confirm dose/schedule in your pharmacy ordering system**    REASON: Other Reason PRIOR TREATMENT: [id number]: Ibrutinib 420 mg Daily Until Progression or Unacceptable Toxicity TREATMENT RESPONSE: Unable to Evaluate  START OFF PATHWAY REGIMEN - Lymphoma and CLL  Off Pathway: Obinutuzumab + Chlorambucil q28 Days  [id number]:Obinutuzumab + Chlorambucil q28 Days:   A cycle is every 28 days:     Obinutuzumab (Gazyva(R)) 100 mg flat dose in 100 mL NS IV over 4 hours on day 1 cycle 1 only. Dose Mod: None     Obinutuzumab (Gazyva(R)) 900 mg flat dose in 250 mL NS IV titrated to a max rate of 400 mg/hr on day 2 cycle 1 only. Initial rate of 50 mg/hr may be increased by 50 mg/hr every 30 minutes if tolerated. Dose Mod: None     Obinutuzumab (Gazyva(R)) 1000 mg flat dose in 250 mL NS IV titrated to a max rate of 400 mg/hr on day 8 and day 15 of cycle 1 only.  Initial rate of 100 mg/hr may be increased by 100 mg/hr every 30 minutes if tolerated. Dose Mod: None     Obinutuzumab (Gazyva(R)) 1000 mg flat dose in 250 mL NS IV titrated to a max rate of 400 mg/hr on day 1 only of cycles 2 through 6. Initial rate of 100 mg/hr may be increased by 100 mg/hr every 30 min if tolerated. Dose Mod: None     Chlorambucil (Leukeran(R)) 0.5 mg/kg Orally on days 1 and 15 of each cycle.  Round to the nearest 2 mg (tablet size). Dose Mod: None Additional Orders: Prescribing info recommends antimicrobial prophylaxis in neutropenic patients and consideration of antiviral/antifungal.  **Always confirm dose/schedule in your pharmacy ordering system**    Patient Characteristics: Small  Lymphocytic Leukemia (SLL), Second Line, 17p del (+) Disease Type: Small Lymphocytic Leukemia (SLL) Disease Type: Not Applicable [place] Stage: IV Line of therapy: Second Line 17p Deletion Status: Positive  Intent of Therapy: Non-Curative / Palliative Intent, Discussed with Patient

## 2017-02-03 ENCOUNTER — Telehealth: Payer: Self-pay | Admitting: *Deleted

## 2017-02-03 ENCOUNTER — Encounter: Payer: Self-pay | Admitting: Hematology and Oncology

## 2017-02-03 NOTE — Assessment & Plan Note (Signed)
Her weight loss is stable while on prednisone therapy. I continue to encourage her to increase oral supplement intake as tolerated

## 2017-02-03 NOTE — Assessment & Plan Note (Signed)
This is likely related to fluid retention from prednisone. Observe only

## 2017-02-03 NOTE — Progress Notes (Signed)
[facility] OFFICE PROGRESS NOTE  Patient Care Team: Kelton Pillar, MD as PCP - General (Family Medicine)  SUMMARY OF ONCOLOGIC HISTORY:   CLL (chronic lymphocytic leukemia) ([place])   11/29/2001 Pathology Results    [phone] Left neck biopsy: LEFT NECK LYMPH NODE, EXCISION: SMALL LYMPHOCYTIC LYMPHOMA/CHRONIC LYMPHOCYTIC LEUKEMIA, SEE COMMENT  COMMENT Sections show effacement of the lymph node architecture by uniform small lymphoid cells displaying chromatin clumping and small to inconspicuous nucleoli. This is associated with numerous proliferation centers (pseudofollicular centers). There is no evidence of high grade transformation. Flow cytometric analysis showed a monoclonal, lambda restricted B cell population expressing pan B cell antigens including CD20 and CD23 with coexpression of CD5 and CD20. 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      01/04/2002 Bone Marrow Biopsy    BONE MARROW, TOUCH IMPRINTS AND CORE BIOPSY: INVOLVEMENT BY SMALL LYMPHOCYTIC LYMPHOMA/CHRONIC LYMPHOCYTIC LEUKEMIA      01/06/2004 Miscellaneous    She was treated for the CLL with Rituxan in 2005/ 2006 and again Jan 2008 thru Feb 2009,with good response, butheld then due to complaints of transient short term memory loss after each RItuxan treatment      10/31/2016 PET scan    Diffuse cervical, axillary, retroperitoneal, and pelvic adenopathy is noted which exhibits very mild increased FDG uptake. I suspect the mild level of increased FDG uptake reflects the inherent hypometabolic state of the tumor as opposed to lack of viable tumor.  2. Brown fat within bilateral supraclavicular and paraspinal fat.      11/12/2016 Imaging    Ct scan showed significant progression of bulky confluent retroperitoneal and bilateral pelvic lymphadenopathy. Mild progression of thoracic lymphadenopathy involving the bilateral axillary, mediastinal and bilateral  hilar chains. 2. New extensive fine perilymphatic distribution nodularity and interlobular septal thickening throughout both lungs, most consistent with lymphangitic tumor, as can be seen in lymphoproliferative disorders. 3. Normal size spleen. 4. Additional findings include aortic atherosclerosis, stable 2.0 cm inferior thyroid isthmus nodule mild sigmoid diverticulosis, mild superior L1 vertebral compression fracture of indeterminate chronicity (new since 03/06/2013), and increased size of moderate midline high ventral abdominal wall hernia containing fat and fluid.      12/31/2016 Pathology Results    Lymph node for lymphoma, left supraclavicular - SMALL LYMPHOCYTIC LYMPHOMA. - SEE ONCOLOGY TABLE. Microscopic Comment LYMPHOMA Histologic type: Non-Hodgkin lymphoma, small lymphocytic type. Grade (if applicable): Low grade. Flow cytometry: Monoclonal, lambda restricted B-cell population expressing pan B-cell antigens including CD20 associated with dim CD5 expression. No CD10 expression is identified ([id number]). Immunohistochemical stains: CD10, CD20, CD3, CD43, CD5, CD79a, and cyclin D1 with appropriate controls. Touch preps/imprints: Predominance of small lymphoid cells with high nuclear cytoplasmic ratio, coarse chromatin and small to inconspicuous nucleoli admixed with larger lymphoid cells to a much lesser extent. Comments: The sections show lymph nodal tissue displaying effacement of the architecture by a lymphoproliferative process characterized by predominance of small lymphoid cells with high nuclear cytoplasmic ratio, round to slightly irregular nuclei, coarse chromatin and small to inconspicuous nucleoli. This is associated with abundant variably sized proliferation centers (pseudofollicular centers) imparting a vaguely nodular pattern. Flow cytometric analysis shows a monoclonal, lambda restricted B-cell population expressing pan B-cell antigens associated with dim CD5 expression. In addition,  immunohistochemical stains were performed and show that the lymphoid cells are predominantly composed of B cells as seen with CD20 and CD79a associated with co-expression with CD43 and weak co-expression with CD5. No significant CD10 or cyclin D1 positivity  is identified. There is admixed T-cell population to a lesser extent as primarily seen with CD3. The findings are consistent with involvement by small lymphocytic lymphoma/chronic lymphocytic leukemia. Clinical correlation is recommended. (BNS:ecj 01/04/2017)      12/31/2016 Surgery    She had excisional biopsy left supraclavicular lymph nodes       INTERVAL HISTORY: Please see below for problem oriented charting. She returns today to discuss alternative treatment options to Ibrutinib. She is not able to get payment assistance to help pay for the medication Her lymph node is stable. She denies worsening lymphadenopathy. Appetite is stable. She complained of bilateral leg edema.  REVIEW OF SYSTEMS:   Constitutional: Denies fevers, chills or abnormal weight loss Eyes: Denies blurriness of vision Ears, nose, mouth, throat, and face: Denies mucositis or sore throat Respiratory: Denies cough, dyspnea or wheezes Cardiovascular: Denies palpitation, chest discomfort  Gastrointestinal:  Denies nausea, heartburn or change in bowel habits Skin: Denies abnormal skin rashes Lymphatics: Denies new lymphadenopathy or easy bruising Neurological:Denies numbness, tingling or new weaknesses Behavioral/Psych: Mood is stable, no new changes  All other systems were reviewed with the patient and are negative.  I have reviewed the past medical history, past surgical history, social history and family history with the patient and they are unchanged from previous note.  ALLERGIES:  is allergic to gluten meal; influenza virus vacc split pf; nutrasweet aspartame [aspartame]; salicylates cross reactors; and sulfa antibiotics.  MEDICATIONS:  Current Outpatient  Prescriptions  Medication Sig Dispense Refill  . acyclovir (ZOVIRAX) 400 MG tablet Take 1 tablet (400 mg total) by mouth daily. 30 tablet 6  . allopurinol (ZYLOPRIM) 300 MG tablet Take 1 tablet (300 mg total) by mouth daily. 30 tablet 1  . Calcium Carbonate-Vitamin D (CALCIUM 600+D) 600-200 MG-UNIT TABS Take 1 tablet by mouth 2 (two) times daily.    . fluticasone (FLONASE) 50 MCG/ACT nasal spray Place 2 sprays into both nostrils daily.    . Multiple Vitamin (MULTIVITAMIN WITH MINERALS) TABS tablet Take 1 tablet by mouth daily.    . prednisoLONE acetate (PRED FORTE) 1 % ophthalmic suspension Place 1 drop into the right eye daily.  2  . predniSONE (DELTASONE) 20 MG tablet Take 1 tablet (20 mg total) by mouth daily with breakfast. 30 tablet 1  . PROAIR HFA 108 (90 Base) MCG/ACT inhaler Inhale 2 puffs into the lungs every 4 (four) hours as needed for wheezing or shortness of breath. Reported on 05/10/2016  3  . guaiFENesin (MUCINEX) 600 MG 12 hr tablet Take by mouth 2 (two) times daily as needed.     Marland Kitchen HYDROcodone-acetaminophen (NORCO/VICODIN) 5-325 MG tablet Take 1 tablet by mouth every 6 (six) hours as needed for moderate pain. (Patient not taking: Reported on 01/24/2017) 30 tablet 0  . ibrutinib (IMBRUVICA) 140 MG capsul Take 3 capsules (420 mg total) by mouth daily. (Patient not taking: Reported on 01/24/2017) 90 capsule 11   No current facility-administered medications for this visit.     PHYSICAL EXAMINATION: ECOG PERFORMANCE STATUS: 1 - Symptomatic but completely ambulatory  Vitals:   02/02/17 1403  BP: (!) 153/53  Pulse: 89  Resp: 17  Temp: 97.7 F (36.5 C)   Filed Weights   02/02/17 1403  Weight: 121 lb 4.8 oz (55 kg)    GENERAL:alert, no distress and comfortable SKIN: skin color, texture, turgor are normal, no rashes or significant lesions EYES: normal, Conjunctiva are pink and non-injected, sclera clear Musculoskeletal:no cyanosis of digits and no clubbing  NEURO:  alert &  oriented x 3 with fluent speech, no focal motor/sensory deficits  LABORATORY DATA:  I have reviewed the data as listed    Component Value Date/Time   NA 144 01/24/2017 1103   K 4.7 01/24/2017 1103   CL 105 12/28/2016 1028   CL 106 03/21/2013 1302   CO2 29 01/24/2017 1103   GLUCOSE 96 01/24/2017 1103   GLUCOSE 87 03/21/2013 1302   BUN 15.4 01/24/2017 1103   CREATININE 0.8 01/24/2017 1103   CALCIUM 10.2 01/24/2017 1103   PROT 6.0 (L) 01/24/2017 1103   ALBUMIN 3.8 01/24/2017 1103   AST 50 (H) 01/24/2017 1103   ALT 26 01/24/2017 1103   ALKPHOS 149 01/24/2017 1103   BILITOT 0.66 01/24/2017 1103   GFRNONAA >60 12/28/2016 1028   GFRAA >60 12/28/2016 1028    No results found for: SPEP, UPEP  Lab Results  Component Value Date   WBC 20.2 (H) 01/24/2017   NEUTROABS 1.1 (L) 01/24/2017   HGB 11.7 01/24/2017   HCT 35.9 01/24/2017   MCV 98.7 01/24/2017   PLT 117 (L) 01/24/2017      Chemistry      Component Value Date/Time   NA 144 01/24/2017 1103   K 4.7 01/24/2017 1103   CL 105 12/28/2016 1028   CL 106 03/21/2013 1302   CO2 29 01/24/2017 1103   BUN 15.4 01/24/2017 1103   CREATININE 0.8 01/24/2017 1103      Component Value Date/Time   CALCIUM 10.2 01/24/2017 1103   ALKPHOS 149 01/24/2017 1103   AST 50 (H) 01/24/2017 1103   ALT 26 01/24/2017 1103   BILITOT 0.66 01/24/2017 1103       ASSESSMENT & PLAN:  CLL (chronic lymphocytic leukemia) ([place]) The patient is profoundly distressed because she could not get payment assistant for Ibrutinib. We have worked extensively to try to get application to multiple charity organizination and because of her income statement, she does not qualify for assistance. Ultimately, the patient desired to switch treatment. I review current guidelines with the patient. The patient would like to avoid chemotherapy. She tolerated recent prednisone therapy well. I shared with her publication below The decision was made based on publication at  the [facility]. It is a category 1 recommendation from [employer]CC[employer]. Nelson Chimes, M.D., Johna Sheriff, M.D., Rubbie Battiest, M.S., Syliva Overman, M.D., Buckner Malta, M.D., Lana Fish. Roderick Pee, M.D., Bradd Burner, M.D., John Giovanni, M.D., Cecil Cobbs, M.D., Lina Sar, M.D., Annie Main Opat, M.D., Derenda Mis. Roxy Manns, M.D., Kathyrn Drown, M.D., Boyce Medici, M.D., Lequita Asal, M.D., Viona Gilmore, M.D., Beryle Beams, Ph.D., Ree Kida, M.D., Sheralyn Boatman, M.D., Tyrone Apple, M.[place] Katherine Roan, B.., Sharen Heck, M.D., and Bonney Leitz, M.D. Alison Stalling J Med 2014; [id number] 20, 2014DOI: 10.1056/[id number]  The chemotherapy consists of obinutuzumab & chlorambucil. We discussed the role of chemotherapy. The intent is for palliative.   1. Chlorambucil was administered orally at a dose of 0.5 mg per kilogram of body weight on days 1 and 15 of each cycle  2. Obinutuzumab was administered intravenously at a dose of 1000 mg on days 1, 8, and 15 of cycle 1 and on day 1 of cycles 2 through 6  3. Prophylaxis for infusion-related reactions and the tumor lysis syndrome included fluid intake and premedication with allopurinol, acetaminophen, antihistamines, and glucocorticoids. 4. Antimicrobial prophylaxis with acyclovir and Bactrim.  The combination chemotherapy yield an average progression free survival approximately 26.7 months and response rates approximately 21%.  Some of the  short term side-effects included, though not limited to, risk of fatigue, weight loss, tumor lysis syndrome, risk of allergic reactions, pancytopenia, life-threatening infections, need for transfusions of blood products, nausea, vomiting, change in bowel habits, risk of congestive heart failure, admission to hospital for various reasons, and risks of death.   Long term side-effects are also discussed including permanent damage to nerve function, chronic fatigue, and rare  secondary malignancy including bone marrow disorders.   The patient is aware that the response rates discussed earlier is not guaranteed.    After a long discussion, patient made an informed decision to proceed with the prescribed plan of care.  However, she does not one to be on chemotherapy, hence I will avoid chlorambucil. She will continue taking prednisone therapy daily until after chemotherapy is started. She will continue allopurinol for tumor lysis prophylaxis and acyclovir for antiviral prophylaxis.   Bilateral leg edema This is likely related to fluid retention from prednisone. Observe only  Goals of care, counseling/discussion The patient is aware she has incurable disease and treatment is strictly palliative. We discussed importance of Advanced Directives and Living will. I will get assistance from our social worker to help her fill out some paperwork. We discussed CODE STATUS; the patient desires to remain in full code.  Unintentional weight loss Her weight loss is stable while on prednisone therapy. I continue to encourage her to increase oral supplement intake as tolerated   Orders Placed This Encounter  Procedures  . Comprehensive metabolic panel    Standing Status:   Future    Standing Expiration Date:   03/09/2018  . CBC with Differential/Platelet    Standing Status:   Future    Standing Expiration Date:   03/09/2018  . Lactate dehydrogenase (LDH)    Standing Status:   Future    Standing Expiration Date:   03/09/2018  . Uric acid    Standing Status:   Future    Standing Expiration Date:   03/09/2018  . Hepatitis B core antibody, IgM    Standing Status:   Future    Standing Expiration Date:   03/09/2018  . Hepatitis B surface antibody    Standing Status:   Future    Standing Expiration Date:   03/09/2018  . Hepatitis B surface antigen    Standing Status:   Future    Standing Expiration Date:   03/09/2018   All questions were answered. The patient knows to call  the clinic with any problems, questions or concerns. No barriers to learning was detected. I spent 40 minutes counseling the patient face to face. The total time spent in the appointment was 55 minutes and more than 50% was on counseling and review of test results     Heath Lark, MD 02/03/2017 8:03 AM

## 2017-02-03 NOTE — Telephone Encounter (Signed)
Pt called for clarification of allopurinol and acyclovir.  She is to continue both for preventative reasons. Will stop prednisone when she starts chemo.  Verbalized understanding

## 2017-02-03 NOTE — Assessment & Plan Note (Signed)
The patient is aware she has incurable disease and treatment is strictly palliative. We discussed importance of Advanced Directives and Living will. I will get assistance from our social worker to help her fill out some paperwork. We discussed CODE STATUS; the patient desires to remain in full code. 

## 2017-02-03 NOTE — Assessment & Plan Note (Signed)
The patient is profoundly distressed because she could not get payment assistant for Ibrutinib. We have worked extensively to try to get application to multiple charity organizination and because of her income statement, she does not qualify for assistance. Ultimately, the patient desired to switch treatment. I review current guidelines with the patient. The patient would like to avoid chemotherapy. She tolerated recent prednisone therapy well. I shared with her publication below The decision was made based on publication at the [facility]. It is a category 1 recommendation from NCCN. Nelson Chimes, M.D., Johna Sheriff, M.D., Rubbie Battiest, M.S., Syliva Overman, M.D., Buckner Malta, M.D., Lana Fish. Roderick Pee, M.D., Bradd Burner, M.D., John Giovanni, M.D., Cecil Cobbs, M.D., Lina Sar, M.D., Annie Main Opat, M.D., Derenda Mis. Roxy Manns, M.D., Kathyrn Drown, M.D., Boyce Medici, M.D., Lequita Asal, M.D., Viona Gilmore, M.D., Beryle Beams, Ph.D., Ree Kida, M.D., Sheralyn Boatman, M.D., Tyrone Apple, M.[place]., Katherine Roan, B.[place]., Sharen Heck, M.D., and Bonney Leitz, M.D. Alison Stalling J Med 2014; [id number] 20, 2014DOI: 10.1056/[id number]  The chemotherapy consists of obinutuzumab & chlorambucil. We discussed the role of chemotherapy. The intent is for palliative.   1. Chlorambucil was administered orally at a dose of 0.5 mg per kilogram of body weight on days 1 and 15 of each cycle  2. Obinutuzumab was administered intravenously at a dose of 1000 mg on days 1, 8, and 15 of cycle 1 and on day 1 of cycles 2 through 6  3. Prophylaxis for infusion-related reactions and the tumor lysis syndrome included fluid intake and premedication with allopurinol, acetaminophen, antihistamines, and glucocorticoids. 4. Antimicrobial prophylaxis with acyclovir and Bactrim.  The combination chemotherapy yield an average progression free survival approximately 26.7  months and response rates approximately 21%.  Some of the short term side-effects included, though not limited to, risk of fatigue, weight loss, tumor lysis syndrome, risk of allergic reactions, pancytopenia, life-threatening infections, need for transfusions of blood products, nausea, vomiting, change in bowel habits, risk of congestive heart failure, admission to hospital for various reasons, and risks of death.   Long term side-effects are also discussed including permanent damage to nerve function, chronic fatigue, and rare secondary malignancy including bone marrow disorders.   The patient is aware that the response rates discussed earlier is not guaranteed.    After a long discussion, patient made an informed decision to proceed with the prescribed plan of care.  However, she does not one to be on chemotherapy, hence I will avoid chlorambucil. She will continue taking prednisone therapy daily until after chemotherapy is started. She will continue allopurinol for tumor lysis prophylaxis and acyclovir for antiviral prophylaxis.

## 2017-02-09 ENCOUNTER — Other Ambulatory Visit (HOSPITAL_BASED_OUTPATIENT_CLINIC_OR_DEPARTMENT_OTHER): Payer: Medicare Other

## 2017-02-09 DIAGNOSIS — C9110 Chronic lymphocytic leukemia of B-cell type not having achieved remission: Secondary | ICD-10-CM | POA: Diagnosis not present

## 2017-02-09 LAB — COMPREHENSIVE METABOLIC PANEL
ALBUMIN: 3.8 g/dL (ref 3.5–5.0)
ALK PHOS: 127 U/L (ref 40–150)
ALT: 34 U/L (ref 0–55)
AST: 50 U/L — AB (ref 5–34)
Anion Gap: 10 mEq/L (ref 3–11)
BILIRUBIN TOTAL: 0.55 mg/dL (ref 0.20–1.20)
BUN: 14.6 mg/dL (ref 7.0–26.0)
CO2: 28 meq/L (ref 22–29)
CREATININE: 0.8 mg/dL (ref 0.6–1.1)
Calcium: 9.7 mg/dL (ref 8.4–10.4)
Chloride: 104 mEq/L (ref 98–109)
EGFR: 85 mL/min/{1.73_m2} — ABNORMAL LOW (ref >90)
GLUCOSE: 124 mg/dL (ref 70–140)
Potassium: 4.3 mEq/L (ref 3.5–5.1)
SODIUM: 143 meq/L (ref 136–145)
TOTAL PROTEIN: 6.2 g/dL — AB (ref 6.4–8.3)

## 2017-02-09 LAB — LACTATE DEHYDROGENASE: LDH: 192 U/L (ref 125–245)

## 2017-02-09 LAB — CBC WITH DIFFERENTIAL/PLATELET
BASO%: 0.1 % (ref 0.0–2.0)
BASOS ABS: 0 10*3/uL (ref 0.0–0.1)
EOS%: 0.1 % (ref 0.0–7.0)
Eosinophils Absolute: 0 10*3/uL (ref 0.0–0.5)
HCT: 37 % (ref 34.8–46.6)
HGB: 12 g/dL (ref 11.6–15.9)
LYMPH%: 85.5 % — AB (ref 14.0–49.7)
MCH: 32.3 pg (ref 25.1–34.0)
MCHC: 32.4 g/dL (ref 31.5–36.0)
MCV: 99.7 fL (ref 79.5–101.0)
MONO#: 0.3 10*3/uL (ref 0.1–0.9)
MONO%: 1.7 % (ref 0.0–14.0)
NEUT#: 1.9 10*3/uL (ref 1.5–6.5)
NEUT%: 12.6 % — AB (ref 38.4–76.8)
Platelets: 153 10*3/uL (ref 145–400)
RBC: 3.71 10*6/uL (ref 3.70–5.45)
RDW: 17.6 % — ABNORMAL HIGH (ref 11.2–14.5)
WBC: 15.2 10*3/uL — ABNORMAL HIGH (ref 3.9–10.3)
lymph#: 13 10*3/uL — ABNORMAL HIGH (ref 0.9–3.3)

## 2017-02-09 LAB — URIC ACID: URIC ACID, SERUM: 2.6 mg/dL (ref 2.6–7.4)

## 2017-02-10 LAB — HEPATITIS B CORE ANTIBODY, IGM: HEP B C IGM: NEGATIVE

## 2017-02-10 LAB — HEPATITIS B SURFACE ANTIBODY,QUALITATIVE: HEP B SURFACE AB, QUAL: NONREACTIVE

## 2017-02-10 LAB — HEPATITIS B SURFACE ANTIGEN: HEP B S AG: NEGATIVE

## 2017-02-11 ENCOUNTER — Other Ambulatory Visit: Payer: Self-pay | Admitting: Hematology and Oncology

## 2017-02-14 ENCOUNTER — Other Ambulatory Visit: Payer: Self-pay | Admitting: Hematology and Oncology

## 2017-02-14 ENCOUNTER — Ambulatory Visit (HOSPITAL_BASED_OUTPATIENT_CLINIC_OR_DEPARTMENT_OTHER): Payer: Medicare Other

## 2017-02-14 VITALS — BP 116/52 | HR 87 | Temp 98.1°F | Resp 17

## 2017-02-14 DIAGNOSIS — C9110 Chronic lymphocytic leukemia of B-cell type not having achieved remission: Secondary | ICD-10-CM | POA: Diagnosis not present

## 2017-02-14 DIAGNOSIS — Z7189 Other specified counseling: Secondary | ICD-10-CM

## 2017-02-14 DIAGNOSIS — Z5112 Encounter for antineoplastic immunotherapy: Secondary | ICD-10-CM | POA: Diagnosis present

## 2017-02-14 MED ORDER — DEXAMETHASONE SODIUM PHOSPHATE 10 MG/ML IJ SOLN
INTRAMUSCULAR | Status: AC
  Filled 2017-02-14: qty 1

## 2017-02-14 MED ORDER — SODIUM CHLORIDE 0.9 % IV SOLN
Freq: Once | INTRAVENOUS | Status: AC
  Administered 2017-02-14: 09:00:00 via INTRAVENOUS

## 2017-02-14 MED ORDER — DIPHENHYDRAMINE HCL 50 MG/ML IJ SOLN
50.0000 mg | Freq: Once | INTRAMUSCULAR | Status: AC
  Administered 2017-02-14: 50 mg via INTRAVENOUS

## 2017-02-14 MED ORDER — ACETAMINOPHEN 325 MG PO TABS
ORAL_TABLET | ORAL | Status: AC
  Filled 2017-02-14: qty 2

## 2017-02-14 MED ORDER — SODIUM CHLORIDE 0.9 % IV SOLN
20.0000 mg | Freq: Once | INTRAVENOUS | Status: AC
  Administered 2017-02-14: 20 mg via INTRAVENOUS
  Filled 2017-02-14: qty 2

## 2017-02-14 MED ORDER — SODIUM CHLORIDE 0.9 % IV SOLN
100.0000 mg | Freq: Once | INTRAVENOUS | Status: AC
  Administered 2017-02-14: 100 mg via INTRAVENOUS
  Filled 2017-02-14: qty 4

## 2017-02-14 MED ORDER — ACETAMINOPHEN 325 MG PO TABS
650.0000 mg | ORAL_TABLET | Freq: Once | ORAL | Status: AC
  Administered 2017-02-14: 650 mg via ORAL

## 2017-02-14 MED ORDER — DIPHENHYDRAMINE HCL 50 MG/ML IJ SOLN
INTRAMUSCULAR | Status: AC
  Filled 2017-02-14: qty 1

## 2017-02-14 NOTE — Progress Notes (Signed)
Pt tolerated infusion well. Printed instructions reviewed and given to pt, pt verbalizes understanding. Pt and VS stable at discharge.

## 2017-02-14 NOTE — Patient Instructions (Signed)
[facility] Discharge Instructions for Patients Receiving Chemotherapy  Today you received the following chemotherapy agents:  Obinutuzumab  Dyann Kief)  To help prevent nausea and vomiting after your treatment, we encourage you to take your nausea medication as prescribed.   If you develop nausea and vomiting that is not controlled by your nausea medication, call the clinic.   BELOW ARE SYMPTOMS THAT SHOULD BE REPORTED IMMEDIATELY:  *FEVER GREATER THAN 100.5 F  *CHILLS WITH OR WITHOUT FEVER  NAUSEA AND VOMITING THAT IS NOT CONTROLLED WITH YOUR NAUSEA MEDICATION  *UNUSUAL SHORTNESS OF BREATH  *UNUSUAL BRUISING OR BLEEDING  TENDERNESS IN MOUTH AND THROAT WITH OR WITHOUT PRESENCE OF ULCERS  *URINARY PROBLEMS  *BOWEL PROBLEMS  UNUSUAL RASH Items with * indicate a potential emergency and should be followed up as soon as possible.  Feel free to call the clinic you have any questions or concerns. The clinic phone number is [phone].  Please show the [place] at check-in to the Emergency Department and triage nurse.     Obinutuzumab injection What is this medicine? OBINUTUZUMAB (OH bi nue TOOZ ue mab) is a monoclonal antibody. It is used to treat chronic lymphocytic leukemia (CLL) and a type of non-Hodgkin lymphoma (NHL), follicular lymphoma. COMMON BRAND NAME(S): GAZYVA What should I tell my health care provider before I take this medicine? They need to know if you have any of these conditions: -infection (especially a virus infection such as hepatitis B virus) -lung or breathing disease -heart disease -take medicines that treat or prevent blood clots -an unusual or allergic reaction to obinutuzumab, other medicines, foods, dyes, or preservatives -pregnant or trying to get pregnant -breast-feeding How should I use this medicine? This medicine is for infusion into a vein. It is given by a health care professional in a hospital or clinic  setting. Talk to your pediatrician regarding the use of this medicine in children. Special care may be needed. What if I miss a dose? Keep appointments for follow-up doses as directed. It is important not to miss your dose. Call your doctor or health care professional if you are unable to keep an appointment. What may interact with this medicine? -live virus vaccines What should I watch for while using this medicine? Report any side effects that you notice during your treatment right away, such as changes in your breathing, fever, chills, dizziness or lightheadedness. These effects are more common with the first dose. Visit your prescriber or health care professional for checks on your progress. You will need to have regular blood work. Report any other side effects. The side effects of this medicine can continue after you finish your treatment. Continue your course of treatment even though you feel ill unless your doctor tells you to stop. Call your doctor or health care professional for advice if you get a fever, chills or sore throat, or other symptoms of a cold or flu. Do not treat yourself. This drug decreases your body's ability to fight infections. Try to avoid being around people who are sick. This medicine may increase your risk to bruise or bleed. Call your doctor or health care professional if you notice any unusual bleeding. What side effects may I notice from receiving this medicine? Side effects that you should report to your doctor or health care professional as soon as possible: -allergic reactions like skin rash, itching or hives, swelling of the face, lips, or tongue -breathing problems -changes in vision -chest pain or chest tightness -confusion -  dizziness -loss of balance or coordination -low blood counts - this medicine may decrease the number of white blood cells, red blood cells and platelets. You may be at increased risk for infections and bleeding. -signs of decreased  platelets or bleeding - bruising, pinpoint red spots on the skin, black, tarry stools, blood in the urine -signs of infection - fever or chills, cough, sore throat, pain or trouble passing urine -signs and symptoms of liver injury like dark yellow or brown urine; general ill feeling or flu-like symptoms; light-colored stools; loss of appetite; nausea; right upper belly pain; unusually weak or tired; yellowing of the eyes or skin -trouble speaking or understanding -trouble walking -vomiting Side effects that usually do not require medical attention (report to your doctor or health care professional if they continue or are bothersome): -constipation -joint pain -muscle pain Where should I keep my medicine? This drug is only given in a hospital or clinic and will not be stored at home.  2017 Elsevier/Gold Standard (2016-01-15 08:54:03)

## 2017-02-15 ENCOUNTER — Ambulatory Visit: Payer: Medicare Other | Admitting: Hematology and Oncology

## 2017-02-15 ENCOUNTER — Other Ambulatory Visit: Payer: Medicare Other

## 2017-02-15 ENCOUNTER — Ambulatory Visit (HOSPITAL_BASED_OUTPATIENT_CLINIC_OR_DEPARTMENT_OTHER): Payer: Medicare Other

## 2017-02-15 VITALS — BP 105/55 | HR 85 | Temp 98.4°F | Resp 18

## 2017-02-15 DIAGNOSIS — C9110 Chronic lymphocytic leukemia of B-cell type not having achieved remission: Secondary | ICD-10-CM | POA: Diagnosis not present

## 2017-02-15 DIAGNOSIS — Z5112 Encounter for antineoplastic immunotherapy: Secondary | ICD-10-CM | POA: Diagnosis present

## 2017-02-15 DIAGNOSIS — Z7189 Other specified counseling: Secondary | ICD-10-CM

## 2017-02-15 MED ORDER — ACETAMINOPHEN 325 MG PO TABS
ORAL_TABLET | ORAL | Status: AC
  Filled 2017-02-15: qty 2

## 2017-02-15 MED ORDER — SODIUM CHLORIDE 0.9 % IV SOLN
20.0000 mg | Freq: Once | INTRAVENOUS | Status: AC
  Administered 2017-02-15: 20 mg via INTRAVENOUS
  Filled 2017-02-15: qty 2

## 2017-02-15 MED ORDER — DIPHENHYDRAMINE HCL 50 MG/ML IJ SOLN
INTRAMUSCULAR | Status: AC
  Filled 2017-02-15: qty 1

## 2017-02-15 MED ORDER — ACETAMINOPHEN 325 MG PO TABS
650.0000 mg | ORAL_TABLET | Freq: Once | ORAL | Status: AC
  Administered 2017-02-15: 650 mg via ORAL

## 2017-02-15 MED ORDER — SODIUM CHLORIDE 0.9 % IV SOLN
900.0000 mg | Freq: Once | INTRAVENOUS | Status: AC
  Administered 2017-02-15: 900 mg via INTRAVENOUS
  Filled 2017-02-15: qty 36

## 2017-02-15 MED ORDER — SODIUM CHLORIDE 0.9 % IV SOLN
Freq: Once | INTRAVENOUS | Status: AC
  Administered 2017-02-15: 12:00:00 via INTRAVENOUS

## 2017-02-15 MED ORDER — DIPHENHYDRAMINE HCL 50 MG/ML IJ SOLN
50.0000 mg | Freq: Once | INTRAMUSCULAR | Status: AC
  Administered 2017-02-15: 50 mg via INTRAVENOUS

## 2017-02-15 NOTE — Patient Instructions (Signed)
[facility] Discharge Instructions for Patients Receiving Chemotherapy  Today you received the following chemotherapy agents:  Obinutuzumab  Dyann Kief)  To help prevent nausea and vomiting after your treatment, we encourage you to take your nausea medication as prescribed.   If you develop nausea and vomiting that is not controlled by your nausea medication, call the clinic.   BELOW ARE SYMPTOMS THAT SHOULD BE REPORTED IMMEDIATELY:  *FEVER GREATER THAN 100.5 F  *CHILLS WITH OR WITHOUT FEVER  NAUSEA AND VOMITING THAT IS NOT CONTROLLED WITH YOUR NAUSEA MEDICATION  *UNUSUAL SHORTNESS OF BREATH  *UNUSUAL BRUISING OR BLEEDING  TENDERNESS IN MOUTH AND THROAT WITH OR WITHOUT PRESENCE OF ULCERS  *URINARY PROBLEMS  *BOWEL PROBLEMS  UNUSUAL RASH Items with * indicate a potential emergency and should be followed up as soon as possible.  Feel free to call the clinic you have any questions or concerns. The clinic phone number is [phone].  Please show the [place] at check-in to the Emergency Department and triage nurse.     Obinutuzumab injection What is this medicine? OBINUTUZUMAB (OH bi nue TOOZ ue mab) is a monoclonal antibody. It is used to treat chronic lymphocytic leukemia (CLL) and a type of non-Hodgkin lymphoma (NHL), follicular lymphoma. COMMON BRAND NAME(S): GAZYVA What should I tell my health care provider before I take this medicine? They need to know if you have any of these conditions: -infection (especially a virus infection such as hepatitis B virus) -lung or breathing disease -heart disease -take medicines that treat or prevent blood clots -an unusual or allergic reaction to obinutuzumab, other medicines, foods, dyes, or preservatives -pregnant or trying to get pregnant -breast-feeding How should I use this medicine? This medicine is for infusion into a vein. It is given by a health care professional in a hospital or clinic  setting. Talk to your pediatrician regarding the use of this medicine in children. Special care may be needed. What if I miss a dose? Keep appointments for follow-up doses as directed. It is important not to miss your dose. Call your doctor or health care professional if you are unable to keep an appointment. What may interact with this medicine? -live virus vaccines What should I watch for while using this medicine? Report any side effects that you notice during your treatment right away, such as changes in your breathing, fever, chills, dizziness or lightheadedness. These effects are more common with the first dose. Visit your prescriber or health care professional for checks on your progress. You will need to have regular blood work. Report any other side effects. The side effects of this medicine can continue after you finish your treatment. Continue your course of treatment even though you feel ill unless your doctor tells you to stop. Call your doctor or health care professional for advice if you get a fever, chills or sore throat, or other symptoms of a cold or flu. Do not treat yourself. This drug decreases your body's ability to fight infections. Try to avoid being around people who are sick. This medicine may increase your risk to bruise or bleed. Call your doctor or health care professional if you notice any unusual bleeding. What side effects may I notice from receiving this medicine? Side effects that you should report to your doctor or health care professional as soon as possible: -allergic reactions like skin rash, itching or hives, swelling of the face, lips, or tongue -breathing problems -changes in vision -chest pain or chest tightness -confusion -  dizziness -loss of balance or coordination -low blood counts - this medicine may decrease the number of white blood cells, red blood cells and platelets. You may be at increased risk for infections and bleeding. -signs of decreased  platelets or bleeding - bruising, pinpoint red spots on the skin, black, tarry stools, blood in the urine -signs of infection - fever or chills, cough, sore throat, pain or trouble passing urine -signs and symptoms of liver injury like dark yellow or brown urine; general ill feeling or flu-like symptoms; light-colored stools; loss of appetite; nausea; right upper belly pain; unusually weak or tired; yellowing of the eyes or skin -trouble speaking or understanding -trouble walking -vomiting Side effects that usually do not require medical attention (report to your doctor or health care professional if they continue or are bothersome): -constipation -joint pain -muscle pain Where should I keep my medicine? This drug is only given in a hospital or clinic and will not be stored at home.  2017 Elsevier/Gold Standard (2016-01-15 08:54:03)

## 2017-02-15 NOTE — Progress Notes (Signed)
Patient in infusion at 11am. During assessment patient reported SOB that resolved last night and chills while waiting in the lobby that had resolved before she got in the infusion room. VS stable. Low grade temp of 99.1 rechecked at 98.7. MD alerted and she said OK to treat.   1300 - VS checked prior to Gazyva infusion. Temp of 100.3 noted. Dr. Burr Medico notified and she instructed to proceed with treatment. Will continue to monitor patient for any further changes.  Wylene Simmer, BSN, RN 02/15/2017 1:29 PM

## 2017-02-16 ENCOUNTER — Telehealth: Payer: Self-pay | Admitting: *Deleted

## 2017-02-16 NOTE — Telephone Encounter (Signed)
Patient states she did fine after chemo on Tuesday. Felt bad after treatment on Monday, but all resolved. Eating and drinking well, denies N/V or diarrhea

## 2017-02-19 ENCOUNTER — Emergency Department (HOSPITAL_COMMUNITY)
Admission: EM | Admit: 2017-02-19 | Discharge: 2017-02-19 | Disposition: A | Discharge status: Home / Self Care | Payer: Medicare Other | Attending: Emergency Medicine | Admitting: Emergency Medicine

## 2017-02-19 ENCOUNTER — Emergency Department (HOSPITAL_COMMUNITY): Payer: Medicare Other

## 2017-02-19 ENCOUNTER — Other Ambulatory Visit: Payer: Self-pay

## 2017-02-19 ENCOUNTER — Encounter (HOSPITAL_COMMUNITY): Payer: Self-pay | Admitting: Emergency Medicine

## 2017-02-19 DIAGNOSIS — R0602 Shortness of breath: Secondary | ICD-10-CM | POA: Diagnosis not present

## 2017-02-19 DIAGNOSIS — J40 Bronchitis, not specified as acute or chronic: Secondary | ICD-10-CM | POA: Diagnosis not present

## 2017-02-19 DIAGNOSIS — D696 Thrombocytopenia, unspecified: Secondary | ICD-10-CM | POA: Diagnosis not present

## 2017-02-19 DIAGNOSIS — Z79899 Other long term (current) drug therapy: Secondary | ICD-10-CM | POA: Insufficient documentation

## 2017-02-19 DIAGNOSIS — R109 Unspecified abdominal pain: Secondary | ICD-10-CM | POA: Diagnosis not present

## 2017-02-19 DIAGNOSIS — R509 Fever, unspecified: Secondary | ICD-10-CM | POA: Diagnosis present

## 2017-02-19 LAB — COMPREHENSIVE METABOLIC PANEL
ALK PHOS: 92 U/L (ref 38–126)
ALT: 32 U/L (ref 14–54)
AST: 54 U/L — ABNORMAL HIGH (ref 15–41)
Albumin: 3.2 g/dL — ABNORMAL LOW (ref 3.5–5.0)
Anion gap: 10 (ref 5–15)
BUN: 13 mg/dL (ref 6–20)
CALCIUM: 8.5 mg/dL — AB (ref 8.9–10.3)
CHLORIDE: 99 mmol/L — AB (ref 101–111)
CO2: 23 mmol/L (ref 22–32)
CREATININE: 0.79 mg/dL (ref 0.44–1.00)
Glucose, Bld: 110 mg/dL — ABNORMAL HIGH (ref 65–99)
Potassium: 4.3 mmol/L (ref 3.5–5.1)
Sodium: 132 mmol/L — ABNORMAL LOW (ref 135–145)
Total Bilirubin: 1.7 mg/dL — ABNORMAL HIGH (ref 0.3–1.2)
Total Protein: 5.8 g/dL — ABNORMAL LOW (ref 6.5–8.1)

## 2017-02-19 LAB — CBC WITH DIFFERENTIAL/PLATELET
BASOS PCT: 0 %
Basophils Absolute: 0 10*3/uL (ref 0.0–0.1)
EOS PCT: 1 %
Eosinophils Absolute: 0 10*3/uL (ref 0.0–0.7)
HEMATOCRIT: 38.1 % (ref 36.0–46.0)
HEMOGLOBIN: 13.3 g/dL (ref 12.0–15.0)
LYMPHS PCT: 24 %
Lymphs Abs: 0.4 10*3/uL — ABNORMAL LOW (ref 0.7–4.0)
MCH: 32.4 pg (ref 26.0–34.0)
MCHC: 34.9 g/dL (ref 30.0–36.0)
MCV: 92.9 fL (ref 78.0–100.0)
MONO ABS: 0.2 10*3/uL (ref 0.1–1.0)
MONOS PCT: 9 %
NEUTROS PCT: 66 %
Neutro Abs: 1.2 10*3/uL — ABNORMAL LOW (ref 1.7–7.7)
PLATELETS: 20 10*3/uL — AB (ref 150–400)
RBC: 4.1 MIL/uL (ref 3.87–5.11)
RDW: 16.8 % — ABNORMAL HIGH (ref 11.5–15.5)
WBC: 1.8 10*3/uL — AB (ref 4.0–10.5)

## 2017-02-19 LAB — I-STAT TROPONIN, ED: TROPONIN I, POC: 0 ng/mL (ref 0.00–0.08)

## 2017-02-19 LAB — URINALYSIS, ROUTINE W REFLEX MICROSCOPIC
Bacteria, UA: NONE SEEN
Bilirubin Urine: NEGATIVE
GLUCOSE, UA: NEGATIVE mg/dL
KETONES UR: NEGATIVE mg/dL
LEUKOCYTES UA: NEGATIVE
Nitrite: NEGATIVE
PH: 6 (ref 5.0–8.0)
Protein, ur: NEGATIVE mg/dL
SPECIFIC GRAVITY, URINE: 1.003 — AB (ref 1.005–1.030)
SQUAMOUS EPITHELIAL / LPF: NONE SEEN

## 2017-02-19 LAB — LIPASE, BLOOD: LIPASE: 23 U/L (ref 11–51)

## 2017-02-19 LAB — PROTIME-INR
INR: 1.16
PROTHROMBIN TIME: 14.9 s (ref 11.4–15.2)

## 2017-02-19 LAB — INFLUENZA PANEL BY PCR (TYPE A & B)
INFLBPCR: NEGATIVE
Influenza A By PCR: NEGATIVE

## 2017-02-19 LAB — I-STAT CG4 LACTIC ACID, ED
LACTIC ACID, VENOUS: 1.93 mmol/L — AB (ref 0.5–1.9)
Lactic Acid, Venous: 0.99 mmol/L (ref 0.5–1.9)

## 2017-02-19 MED ORDER — LEVOFLOXACIN 500 MG PO TABS
500.0000 mg | ORAL_TABLET | Freq: Once | ORAL | Status: AC
  Administered 2017-02-19: 500 mg via ORAL
  Filled 2017-02-19: qty 1

## 2017-02-19 MED ORDER — SODIUM CHLORIDE 0.9 % IV BOLUS (SEPSIS)
1000.0000 mL | Freq: Once | INTRAVENOUS | Status: AC
  Administered 2017-02-19: 1000 mL via INTRAVENOUS

## 2017-02-19 MED ORDER — LEVOFLOXACIN 500 MG PO TABS: 500.0000 mg | ORAL_TABLET | Freq: Every day | ORAL | 0 refills | Status: DC

## 2017-02-19 MED ORDER — ONDANSETRON HCL 4 MG/2ML IJ SOLN
4.0000 mg | Freq: Once | INTRAMUSCULAR | Status: AC
  Administered 2017-02-19: 4 mg via INTRAVENOUS
  Filled 2017-02-19: qty 2

## 2017-02-19 MED ORDER — ONDANSETRON 4 MG PO TBDP: ORAL_TABLET | ORAL | 0 refills | Status: AC

## 2017-02-19 MED ORDER — IOPAMIDOL (ISOVUE-370) INJECTION 76%
INTRAVENOUS | Status: AC
  Administered 2017-02-19: 100 mL
  Filled 2017-02-19: qty 100

## 2017-02-19 NOTE — Discharge Instructions (Signed)
Stay hydrated.   Take levaquin daily for a week.   Take zofran as needed for nausea.   See your oncologist on Monday.   Your platelet count and white blood cell count is slightly low, make sure it is repeated on Monday.   Return to ER if you have fevers, trouble breathing, vomiting, severe abdominal pain.

## 2017-02-19 NOTE — ED Notes (Signed)
Patient transported to X-ray 

## 2017-02-19 NOTE — ED Triage Notes (Signed)
Per pt/husband-states she has been sick for about 3 days-states SOB since last night and this morning-states she vomited this am-had treatment on Monday and Tuesday

## 2017-02-19 NOTE — ED Provider Notes (Signed)
[facility] DEPT Provider Note   CSN: [id number] Arrival date & time: 02/19/17  0917     History   Chief Complaint Chief Complaint  Patient presents with  . Shortness of Breath  . Fever    HPI Brenda Terrell is a 76 y.o. 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 She states that she had chemotherapy about 5 days ago. She had a low-grade temperature 100.3 at that time but still got chemotherapy as well as prednisone. Patient states that she felt better until yesterday. Started running a low-grade temperature and become short of breath with exertion. Denies any chest pain or cough. Morning she had a low-grade temperature 100 at home but did not take anything. Also had one episode of vomiting and had some left upper quadrant pain. Denies any cardiac history and denies any recent travels.  The history is provided by the patient.    Past Medical History:  Diagnosis Date  . Arthritis    HIP  . Asthma   . Blood transfusion    POST D&C  . 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 Complication of anesthesia   . GERD (gastroesophageal reflux disease)   . Goiter   . History of migraines   . Mitral valve prolapse   . PONV (postoperative nausea and vomiting)     Patient Active Problem List   Diagnosis Date Noted  . Thrombocytopenia ([place]) 01/24/2017  . Goals of care, counseling/discussion 01/06/2017  . Bilateral leg edema 01/06/2017  . Atherosclerosis of aorta ([place]) 11/24/2016  . Compression fracture of L1 lumbar vertebra, sequela 11/24/2016  . Unintentional weight loss 11/14/2016  . 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 Immunocompromised ([place]) 05/16/2016  . Multiple environmental allergies 02/14/2016  . Dense breast tissue 02/14/2015  . CLL (chronic lymphocytic leukemia) ([place]) 07/05/2012  . Leukopenia  07/05/2012  . 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 FOOT SURGERY     bilateral  . LYMPH NODE BIOPSY Left 12/31/2016   Procedure: LYMPH NODE BIOPSY LEFT SUPRACLIAVICULAR;  Surgeon: Clovis Riley, MD;  Location: [facility];  Service: General;  Laterality: Left;  . TONSILLECTOMY      OB History    No data available       Home Medications    Prior to Admission medications   Medication Sig Start Date End Date Taking? Authorizing Provider  acyclovir (ZOVIRAX) 400 MG tablet Take 1 tablet (400 mg total) by mouth daily. 01/04/17  Yes Heath Lark, MD  allopurinol (ZYLOPRIM) 300 MG tablet Take 1 tablet (300 mg total) by mouth daily. 01/04/17  Yes Heath Lark, MD  Calcium Carbonate-Vitamin D (CALCIUM 600+D) 600-200 MG-UNIT TABS Take 1 tablet by mouth 2 (two) times daily.   Yes Historical Provider, MD  Multiple Vitamin (MULTIVITAMIN WITH MINERALS) TABS tablet Take 1 tablet by mouth daily.   Yes Historical Provider, MD  prednisoLONE acetate (PRED FORTE) 1 % ophthalmic suspension Place 1 drop into the right eye daily. 12/30/16  Yes Historical Provider, MD  HYDROcodone-acetaminophen (NORCO/VICODIN) 5-325 MG tablet Take 1 tablet by mouth every 6 (six) hours as needed for moderate pain. Patient not taking: Reported on 01/24/2017 12/31/16   Clovis Riley, MD  ibrutinib (IMBRUVICA) 140 MG capsul Take 3 capsules (420 mg total) by mouth daily. Patient not taking: Reported  on 01/24/2017 01/06/17   Heath Lark, MD  predniSONE (DELTASONE) 20 MG tablet Take 1 tablet (20 mg total) by mouth daily with breakfast. Patient not taking: Reported on 02/19/2017 01/24/17   Heath Lark, MD    Family History Family History  Problem Relation Age of Onset  . Cancer Mother 47    breast ca  . Cancer Brother     bone cancer    Social History Social History  Substance Use Topics  . Smoking status: Never Smoker  . Smokeless tobacco: Never Used  . Alcohol use No      Allergies   Gluten meal; Influenza virus vacc split pf; Nutrasweet aspartame [aspartame]; Salicylates cross reactors; and Sulfa antibiotics   Review of Systems Review of Systems  Constitutional: Positive for fever.  Respiratory: Positive for shortness of breath.   All other systems reviewed and are negative.    Physical Exam Updated Vital Signs BP 114/76   Pulse 94   Temp 97.9 F (36.6 C)   Resp (!) 27   Ht 5\' 1"  (1.549 m)   Wt 118 lb (53.5 kg)   SpO2 97%   BMI 22.30 kg/m   Physical Exam  Constitutional: She is oriented to person, place, and time.  Chronically ill, slightly dehydrated   HENT:  Head: Normocephalic.  MM slightly dry   Eyes: EOM are normal. Pupils are equal, round, and reactive to light.  Neck: Normal range of motion. Neck supple.  Cardiovascular: Normal rate, regular rhythm and normal heart sounds.   Pulmonary/Chest: Effort normal.  Diminished bilateral bases, no obvious crackles   Abdominal: Soft. Bowel sounds are normal.  Mild LUQ tenderness, no rebound.   Musculoskeletal: Normal range of motion. She exhibits no edema.  Neurological: She is alert and oriented to person, place, and time. No cranial nerve deficit. Coordination normal.  Skin: Skin is warm.  Psychiatric: She has a normal mood and affect.  Nursing note and vitals reviewed.    ED Treatments / Results  Labs (all labs ordered are listed, but only abnormal results are displayed) Labs Reviewed  CBC WITH DIFFERENTIAL/PLATELET - Abnormal; Notable for the following:       Result Value   WBC 1.8 (*)    RDW 16.8 (*)    Platelets 20 (*)    Neutro Abs 1.2 (*)    Lymphs Abs 0.4 (*)    All other components within normal limits  COMPREHENSIVE METABOLIC PANEL - Abnormal; Notable for the following:    Sodium 132 (*)    Chloride 99 (*)    Glucose, Bld 110 (*)    Calcium 8.5 (*)    Total Protein 5.8 (*)    Albumin 3.2 (*)    AST 54 (*)    Total Bilirubin 1.7 (*)    All other  components within normal limits  URINALYSIS, ROUTINE W REFLEX MICROSCOPIC - Abnormal; Notable for the following:    Specific Gravity, Urine 1.003 (*)    Hgb urine dipstick SMALL (*)    All other components within normal limits  I-STAT CG4 LACTIC ACID, ED - Abnormal; Notable for the following:    Lactic Acid, Venous 1.93 (*)    All other components within normal limits  CULTURE, BLOOD (ROUTINE X 2)  CULTURE, BLOOD (ROUTINE X 2)  URINE CULTURE  LIPASE, BLOOD  INFLUENZA PANEL BY PCR (TYPE A & B)  PROTIME-INR  I-STAT TROPOININ, ED  I-STAT CG4 LACTIC ACID, ED    EKG  EKG Interpretation  Date/Time:  Saturday February 19 2017 09:27:13 EST Ventricular Rate:  91 PR Interval:    QRS Duration: 78 QT Interval:  354 QTC Calculation: 436 R Axis:   29 Text Interpretation:  Sinus rhythm Atrial premature complex Baseline wander in lead(s) V4 No previous ECGs available Confirmed by Hillarie Harrigan  MD, Eisa Necaise ([id number]) on 02/19/2017 9:42:28 AM       Radiology Dg Chest 2 View  Result Date: 02/19/2017 CLINICAL DATA:  Left-sided chest congestion, shortness of breath and weakness. EXAM: CHEST  2 VIEW COMPARISON:  CT chest 11/12/2016 and chest radiograph 02/03/2016. FINDINGS: Trachea is midline. Heart size stable. Thoracic aorta is calcified. Biapical and likely bibasilar scarring. Lungs are somewhat hyperinflated but otherwise clear. IMPRESSION: No acute findings. Electronically Signed   By: Lorin Picket M.D.   On: 02/19/2017 10:15   Ct Angio Chest Pe W And/or Wo Contrast  Result Date: 02/19/2017 CLINICAL DATA:  76 year old female with shortness of breath, abdominal and pelvic pain and vomiting for 3 days. History of CLL. EXAM: CT ANGIOGRAPHY CHEST CT ABDOMEN AND PELVIS WITH CONTRAST TECHNIQUE: Multidetector CT imaging of the chest was performed using the standard protocol during bolus administration of intravenous contrast. Multiplanar CT image reconstructions and MIPs were obtained to evaluate the vascular  anatomy. Multidetector CT imaging of the abdomen and pelvis was performed using the standard protocol during bolus administration of intravenous contrast. CONTRAST:  100 cc intravenous Omnipaque 300 COMPARISON:  11/12/2016 and prior CTs FINDINGS: CTA CHEST FINDINGS Cardiovascular: This is a technically adequate study. No pulmonary emboli or thoracic aortic aneurysm identified. Cardiomegaly is present. No pericardial effusion identified. Mediastinum/Nodes: Multiple enlarged lymph nodes within both axillary, hilar and subcarinal regions are unchanged. No mediastinal mass is identified. Lungs/Pleura: Interstitial opacities are unchanged. No airspace disease, consolidation, pleural effusion or pneumothorax identified. Musculoskeletal: No acute or suspicious abnormality. Review of the MIP images confirms the above findings. CT ABDOMEN and PELVIS FINDINGS Hepatobiliary: The liver and gallbladder are unremarkable. There is no evidence of biliary dilatation. Pancreas: Unremarkable Spleen: Unremarkable Adrenals/Urinary Tract: The kidneys, adrenal glands and bladder are unremarkable. Stomach/Bowel: Stomach is within normal limits. Appendix appears normal. No evidence of bowel wall thickening, distention, or inflammatory changes. Vascular/Lymphatic: Moderate to marked lymphadenopathy within the abdomen, retroperitoneum and pelvis again noted and not significantly changed. Index node includes the following: 3.4 cm right retroperitoneal node (image 30) 4.4 cm retroperitoneal/abdominal node (image 37) 2.1 cm left common iliac node (image 42) 3.8 cm right common iliac node (image 43) No new adenopathy identified. There is no evidence of abdominal aortic aneurysm. Reproductive: Uterus and adnexal regions are unremarkable. Other: No free fluid, pneumoperitoneum or abscess. Musculoskeletal: No acute or significant osseous findings. Mild compression of the L1 superior endplate is unchanged. Mild to moderate degenerative disc disease  throughout the lumbar spine again noted. Review of the MIP images confirms the above findings. IMPRESSION: No evidence of acute abnormality. Unchanged lymphadenopathy throughout the chest abdomen and pelvis compatible with this patient's history of CLL. Chronic bilateral interstitial pulmonary opacities. Electronically Signed   By: Margarette Canada M.D.   On: 02/19/2017 12:47   Ct Abdomen Pelvis W Contrast  Result Date: 02/19/2017 CLINICAL DATA:  76 year old female with shortness of breath, abdominal and pelvic pain and vomiting for 3 days. History of CLL. EXAM: CT ANGIOGRAPHY CHEST CT ABDOMEN AND PELVIS WITH CONTRAST TECHNIQUE: Multidetector CT imaging of the chest was performed using the standard protocol during bolus administration of intravenous contrast. Multiplanar CT image reconstructions and MIPs  were obtained to evaluate the vascular anatomy. Multidetector CT imaging of the abdomen and pelvis was performed using the standard protocol during bolus administration of intravenous contrast. CONTRAST:  100 cc intravenous Omnipaque 300 COMPARISON:  11/12/2016 and prior CTs FINDINGS: CTA CHEST FINDINGS Cardiovascular: This is a technically adequate study. No pulmonary emboli or thoracic aortic aneurysm identified. Cardiomegaly is present. No pericardial effusion identified. Mediastinum/Nodes: Multiple enlarged lymph nodes within both axillary, hilar and subcarinal regions are unchanged. No mediastinal mass is identified. Lungs/Pleura: Interstitial opacities are unchanged. No airspace disease, consolidation, pleural effusion or pneumothorax identified. Musculoskeletal: No acute or suspicious abnormality. Review of the MIP images confirms the above findings. CT ABDOMEN and PELVIS FINDINGS Hepatobiliary: The liver and gallbladder are unremarkable. There is no evidence of biliary dilatation. Pancreas: Unremarkable Spleen: Unremarkable Adrenals/Urinary Tract: The kidneys, adrenal glands and bladder are unremarkable.  Stomach/Bowel: Stomach is within normal limits. Appendix appears normal. No evidence of bowel wall thickening, distention, or inflammatory changes. Vascular/Lymphatic: Moderate to marked lymphadenopathy within the abdomen, retroperitoneum and pelvis again noted and not significantly changed. Index node includes the following: 3.4 cm right retroperitoneal node (image 30) 4.4 cm retroperitoneal/abdominal node (image 37) 2.1 cm left common iliac node (image 42) 3.8 cm right common iliac node (image 43) No new adenopathy identified. There is no evidence of abdominal aortic aneurysm. Reproductive: Uterus and adnexal regions are unremarkable. Other: No free fluid, pneumoperitoneum or abscess. Musculoskeletal: No acute or significant osseous findings. Mild compression of the L1 superior endplate is unchanged. Mild to moderate degenerative disc disease throughout the lumbar spine again noted. Review of the MIP images confirms the above findings. IMPRESSION: No evidence of acute abnormality. Unchanged lymphadenopathy throughout the chest abdomen and pelvis compatible with this patient's history of CLL. Chronic bilateral interstitial pulmonary opacities. Electronically Signed   By: Margarette Canada M.D.   On: 02/19/2017 12:47    Procedures Procedures (including critical care time)  EMERGENCY DEPARTMENT  US GUIDANCE EXAM Emergency Ultrasound:  US Guidance for Needle Guidance  INDICATIONS: Difficult vascular access Linear probe used in real-time to visualize location of needle entry through skin.   PERFORMED BY: Myself IMAGES ARCHIVED?: No LIMITATIONS: Pain VIEWS USED: Transverse INTERPRETATION: Needle visualized within vein  Medications Ordered in ED Medications  sodium chloride 0.9 % bolus 1,000 mL (0 mLs Intravenous Stopped 02/19/17 1120)  ondansetron (ZOFRAN) injection 4 mg (4 mg Intravenous Given 02/19/17 1025)  iopamidol (ISOVUE-370) 76 % injection (100 mLs  Contrast Given 02/19/17 1148)  sodium chloride  0.9 % bolus 1,000 mL (0 mLs Intravenous Stopped 02/19/17 1424)  levofloxacin (LEVAQUIN) tablet 500 mg (500 mg Oral Given 02/19/17 1425)     Initial Impression / Assessment and Plan / ED Course  I have reviewed the triage vital signs and the nursing notes.  Pertinent labs & imaging results that were available during my care of the patient were reviewed by me and considered in my medical decision making (see chart for details).     Brenda Terrell is a 76 y.o. female here with SOB, chills, abdominal pain. She had recent chemo and has CLL. Consider neutropenic fever vs worsening cancer vs flu. Will do sepsis workup, CXR, UA, flu. Will hydrate patient and reassess.   2:29 PM Initial lactate was 1.9, cleared to 0.99 after 2 L NS bolus. WBC 1.8, ANC nl. Platelets 20 that is new since recent chemo. No bleeding or bruising anywhere. CXR clear. Had retroperitoneal enlarged lymph nodes that are stable. CT angio chest showed  some pulmonary opacities that are chronic but no obvious infiltrate. Not hypoxic. Flu neg. Tolerated PO in the ED. I called Dr. Marin Olp from oncology. He agrees with levaquin for bronchitis/early pneumonia. She has chemo scheduled on Monday and will get repeat CBC then and he felt that since she is not bleeding, the thrombocytopenia can be followed up outpatient.   Final Clinical Impressions(s) / ED Diagnoses   Final diagnoses:  None    New Prescriptions New Prescriptions   No medications on file     Drenda Freeze, MD 02/19/17 1438

## 2017-02-19 NOTE — ED Notes (Signed)
Unable to collect labs I looked and felt and did not see anywhere to collect labs froms

## 2017-02-20 LAB — URINE CULTURE: Culture: NO GROWTH

## 2017-02-21 ENCOUNTER — Ambulatory Visit: Payer: Medicare Other

## 2017-02-21 ENCOUNTER — Other Ambulatory Visit: Payer: Self-pay | Admitting: Hematology and Oncology

## 2017-02-21 ENCOUNTER — Other Ambulatory Visit (HOSPITAL_BASED_OUTPATIENT_CLINIC_OR_DEPARTMENT_OTHER): Payer: Medicare Other

## 2017-02-21 DIAGNOSIS — C9110 Chronic lymphocytic leukemia of B-cell type not having achieved remission: Secondary | ICD-10-CM | POA: Diagnosis present

## 2017-02-21 LAB — URIC ACID: Uric Acid, Serum: 2.9 mg/dl (ref 2.6–7.4)

## 2017-02-21 LAB — COMPREHENSIVE METABOLIC PANEL
ALBUMIN: 2.9 g/dL — AB (ref 3.5–5.0)
ALT: 31 U/L (ref 0–55)
AST: 56 U/L — AB (ref 5–34)
Alkaline Phosphatase: 108 U/L (ref 40–150)
Anion Gap: 8 mEq/L (ref 3–11)
BUN: 9.6 mg/dL (ref 7.0–26.0)
CHLORIDE: 103 meq/L (ref 98–109)
CO2: 25 meq/L (ref 22–29)
Calcium: 8.7 mg/dL (ref 8.4–10.4)
Creatinine: 0.7 mg/dL (ref 0.6–1.1)
EGFR: >90 mL/min/{1.73_m2} (ref >90)
GLUCOSE: 103 mg/dL (ref 70–140)
POTASSIUM: 4 meq/L (ref 3.5–5.1)
SODIUM: 137 meq/L (ref 136–145)
Total Bilirubin: 1.23 mg/dL — ABNORMAL HIGH (ref 0.20–1.20)
Total Protein: 5.4 g/dL — ABNORMAL LOW (ref 6.4–8.3)

## 2017-02-21 LAB — CBC WITH DIFFERENTIAL/PLATELET
BASO%: 0 % (ref 0.0–2.0)
BASOS ABS: 0 10*3/uL (ref 0.0–0.1)
EOS%: 4.2 % (ref 0.0–7.0)
Eosinophils Absolute: 0 10*3/uL (ref 0.0–0.5)
HCT: 35.1 % (ref 34.8–46.6)
HEMOGLOBIN: 11.8 g/dL (ref 11.6–15.9)
LYMPH%: 37.9 % (ref 14.0–49.7)
MCH: 32.2 pg (ref 25.1–34.0)
MCHC: 33.6 g/dL (ref 31.5–36.0)
MCV: 95.9 fL (ref 79.5–101.0)
MONO#: 0.1 10*3/uL (ref 0.1–0.9)
MONO%: 5.3 % (ref 0.0–14.0)
NEUT#: 0.5 10*3/uL — CL (ref 1.5–6.5)
NEUT%: 52.6 % (ref 38.4–76.8)
Platelets: 36 10*3/uL — ABNORMAL LOW (ref 145–400)
RBC: 3.66 10*6/uL — ABNORMAL LOW (ref 3.70–5.45)
RDW: 17.3 % — AB (ref 11.2–14.5)
WBC: 1 10*3/uL — ABNORMAL LOW (ref 3.9–10.3)
lymph#: 0.4 10*3/uL — ABNORMAL LOW (ref 0.9–3.3)

## 2017-02-21 LAB — LACTATE DEHYDROGENASE: LDH: 269 U/L — ABNORMAL HIGH (ref 125–245)

## 2017-02-21 NOTE — Telephone Encounter (Signed)
Oral Chemotherapy Pharmacist Encounter  Received notification from [place] that patient has been DENIED for enrollment into their program to receive Imbruvica free of charge from the manufacturer. Patient's income is too high to qualify. Patient has been notified by [facility] and has already been started on IV chemotherapy.  Brenda Terrell, PharmD, BCPS, BCOP 02/21/2017  10:09 AM Oral Oncology Clinic [phone]

## 2017-02-21 NOTE — Patient Instructions (Signed)
Neutropenia Introduction Neutropenia is a condition that occurs when you have a lower-than-normal level of a type of white blood cell (neutrophil) in your body. Neutrophils are made in the spongy center of large bones (bone marrow) and they fight infections. Neutrophils are your body's main defense against bacterial and fungal infections. The fewer neutrophils you have and the longer your body remains without them, the greater your risk of getting a severe infection. What are the causes? This condition can occur if your body uses up or destroys neutrophils faster than your bone marrow can make them. This problem may happen because of:  Bacterial or fungal infection.  Allergic disorders.  Reactions to some medicines.  Autoimmune disease.  An enlarged spleen. This condition can also occur if your bone marrow does not produce enough neutrophils. This problem may be caused by:  Cancer.  Cancer treatments, such as radiation or chemotherapy.  Viral infections.  Medicines, such as phenytoin.  Vitamin B12 deficiency.  Diseases of the bone marrow.  Environmental toxins, such as insecticides. What are the signs or symptoms? This condition does not usually cause symptoms. If symptoms are present, they are usually caused by an underlying infection. Symptoms of an infection may include:  Fever.  Chills.  Swollen glands.  Oral or anal ulcers.  Cough and shortness of breath.  Rash.  Skin infection.  Fatigue. How is this diagnosed? Your health care provider may suspect neutropenia if you have:  A condition that may cause neutropenia.  Symptoms of infection, especially fever.  Frequent and unusual infections. You will have a medical history and physical exam. Tests will also be done, such as:  A complete blood count (CBC).  A procedure to collect a sample of bone marrow for examination (bone marrow biopsy).  A chest X-ray.  A urine culture.  A blood culture. How is  this treated? Treatment depends on the underlying cause and severity of your condition. Mild neutropenia may not require treatment. Treatment may include medicines, such as:  Antibiotic medicine given through an IV tube.  Antiviral medicines.  Antifungal medicines.  A medicine to increase neutrophil production (colony-stimulating factor). You may get this drug through an IV tube or by injection.  Steroids given through an IV tube. If an underlying condition is causing neutropenia, you may need treatment for that condition. If medicines you are taking are causing neutropenia, your health care provider may have you stop taking those medicines. Follow these instructions at home: Medicines  Take over-the-counter and prescription medicines only as told by your health care provider.  Get a seasonal flu shot (influenza vaccine). Lifestyle  Do not eat unpasteurized foods.Do not eat unwashed raw fruits or vegetables.  Avoid exposure to groups of people or children.  Avoid being around people who are sick.  Avoid being around dirt or dust, such as in construction areas or gardens.  Do not provide direct care for pets. Avoid animal droppings. Do not clean litter boxes and bird cages. Hygiene   Bathe daily.  Clean the area between the genitals and the anus (perineal area) after you urinate or have a bowel movement. If you are female, wipe from front to back.  Brush your teeth with a soft toothbrush before and after meals.  Do not use a razor that has a blade. Use an electric razor to remove hair.  Wash your hands often. Make sure others who come in contact with you also wash their hands. If soap and water are not available, use hand   sanitizer. General instructions  Do not have sex unless your health care provider has approved.  Take actions to avoid cuts and burns. For example:  Be cautious when you use knives. Always cut away from yourself.  Keep knives in protective sheaths or  guards when not in use.  Use oven mitts when you cook with a hot stove, oven, or grill.  Stand a safe distance away from open fires.  Avoid people who received a vaccine in the past 30 days if that vaccine contained a live version of the germ (live vaccine). You should not get a live vaccine. Common live vaccines are varicella, measles, mumps, and rubella.  Do not share food utensils.  Do not use tampons, enemas, or rectal suppositories unless your health care provider has approved.  Keep all appointments as told by your health care provider. This is important. Contact a health care provider if:  You have a fever.  You have chills or you start to shake.  You have:  A sore throat.  A warm, red, or tender area on your skin.  A cough.  Frequent or painful urination.  Vaginal discharge or itching.  You develop:  Sores in your mouth or anus.  Swollen lymph nodes.  Red streaks on the skin.  A rash.  You feel:  Nauseous or you vomit.  Very fatigued.  Short of breath. This information is not intended to replace advice given to you by your health care provider. Make sure you discuss any questions you have with your health care provider. Document Released: 06/04/2002 Document Revised: 05/20/2016 Document Reviewed: 06/25/2015  2017 [employer]

## 2017-02-24 LAB — CULTURE, BLOOD (ROUTINE X 2)
CULTURE: NO GROWTH
Culture: NO GROWTH

## 2017-02-28 ENCOUNTER — Other Ambulatory Visit (HOSPITAL_BASED_OUTPATIENT_CLINIC_OR_DEPARTMENT_OTHER): Payer: Medicare Other

## 2017-02-28 ENCOUNTER — Ambulatory Visit (HOSPITAL_BASED_OUTPATIENT_CLINIC_OR_DEPARTMENT_OTHER): Payer: Medicare Other | Admitting: Hematology and Oncology

## 2017-02-28 ENCOUNTER — Telehealth: Payer: Self-pay | Admitting: Hematology and Oncology

## 2017-02-28 ENCOUNTER — Ambulatory Visit (HOSPITAL_BASED_OUTPATIENT_CLINIC_OR_DEPARTMENT_OTHER): Payer: Medicare Other

## 2017-02-28 VITALS — BP 113/55 | HR 95 | Temp 98.6°F | Resp 18

## 2017-02-28 DIAGNOSIS — Z5112 Encounter for antineoplastic immunotherapy: Secondary | ICD-10-CM

## 2017-02-28 DIAGNOSIS — D696 Thrombocytopenia, unspecified: Secondary | ICD-10-CM | POA: Diagnosis not present

## 2017-02-28 DIAGNOSIS — D701 Agranulocytosis secondary to cancer chemotherapy: Secondary | ICD-10-CM

## 2017-02-28 DIAGNOSIS — R600 Localized edema: Secondary | ICD-10-CM | POA: Diagnosis not present

## 2017-02-28 DIAGNOSIS — D6481 Anemia due to antineoplastic chemotherapy: Secondary | ICD-10-CM

## 2017-02-28 DIAGNOSIS — C9110 Chronic lymphocytic leukemia of B-cell type not having achieved remission: Secondary | ICD-10-CM

## 2017-02-28 DIAGNOSIS — Z7189 Other specified counseling: Secondary | ICD-10-CM

## 2017-02-28 DIAGNOSIS — T451X5A Adverse effect of antineoplastic and immunosuppressive drugs, initial encounter: Secondary | ICD-10-CM

## 2017-02-28 LAB — COMPREHENSIVE METABOLIC PANEL
ALT: 28 U/L (ref 0–55)
AST: 59 U/L — AB (ref 5–34)
Albumin: 3.2 g/dL — ABNORMAL LOW (ref 3.5–5.0)
Alkaline Phosphatase: 119 U/L (ref 40–150)
Anion Gap: 7 mEq/L (ref 3–11)
BILIRUBIN TOTAL: 1.04 mg/dL (ref 0.20–1.20)
BUN: 7.4 mg/dL (ref 7.0–26.0)
CO2: 29 meq/L (ref 22–29)
Calcium: 9.2 mg/dL (ref 8.4–10.4)
Chloride: 105 mEq/L (ref 98–109)
Creatinine: 0.7 mg/dL (ref 0.6–1.1)
GLUCOSE: 93 mg/dL (ref 70–140)
POTASSIUM: 4.1 meq/L (ref 3.5–5.1)
SODIUM: 141 meq/L (ref 136–145)
TOTAL PROTEIN: 5.5 g/dL — AB (ref 6.4–8.3)

## 2017-02-28 LAB — CBC WITH DIFFERENTIAL/PLATELET
BASO%: 1.6 % (ref 0.0–2.0)
Basophils Absolute: 0 10*3/uL (ref 0.0–0.1)
EOS ABS: 0.1 10*3/uL (ref 0.0–0.5)
EOS%: 2.6 % (ref 0.0–7.0)
HCT: 34.5 % — ABNORMAL LOW (ref 34.8–46.6)
HGB: 11.3 g/dL — ABNORMAL LOW (ref 11.6–15.9)
LYMPH%: 34.7 % (ref 14.0–49.7)
MCH: 32.1 pg (ref 25.1–34.0)
MCHC: 32.8 g/dL (ref 31.5–36.0)
MCV: 98 fL (ref 79.5–101.0)
MONO#: 0.2 10*3/uL (ref 0.1–0.9)
MONO%: 12.1 % (ref 0.0–14.0)
NEUT%: 49 % (ref 38.4–76.8)
NEUTROS ABS: 0.9 10*3/uL — AB (ref 1.5–6.5)
Platelets: 109 10*3/uL — ABNORMAL LOW (ref 145–400)
RBC: 3.52 10*6/uL — AB (ref 3.70–5.45)
RDW: 17.3 % — AB (ref 11.2–14.5)
WBC: 1.9 10*3/uL — AB (ref 3.9–10.3)
lymph#: 0.7 10*3/uL — ABNORMAL LOW (ref 0.9–3.3)

## 2017-02-28 LAB — LACTATE DEHYDROGENASE: LDH: 259 U/L — ABNORMAL HIGH (ref 125–245)

## 2017-02-28 LAB — URIC ACID: Uric Acid, Serum: 2.7 mg/dl (ref 2.6–7.4)

## 2017-02-28 MED ORDER — ACETAMINOPHEN 325 MG PO TABS
ORAL_TABLET | ORAL | Status: AC
  Filled 2017-02-28: qty 2

## 2017-02-28 MED ORDER — SODIUM CHLORIDE 0.9 % IV SOLN
20.0000 mg | Freq: Once | INTRAVENOUS | Status: AC
  Administered 2017-02-28: 20 mg via INTRAVENOUS
  Filled 2017-02-28: qty 2

## 2017-02-28 MED ORDER — ACETAMINOPHEN 325 MG PO TABS
650.0000 mg | ORAL_TABLET | Freq: Once | ORAL | Status: AC
Start: 2017-02-28 — End: 2017-02-28
  Administered 2017-02-28: 650 mg via ORAL

## 2017-02-28 MED ORDER — DIPHENHYDRAMINE HCL 50 MG/ML IJ SOLN
50.0000 mg | Freq: Once | INTRAMUSCULAR | Status: AC
  Administered 2017-02-28: 50 mg via INTRAVENOUS

## 2017-02-28 MED ORDER — SODIUM CHLORIDE 0.9 % IV SOLN
Freq: Once | INTRAVENOUS | Status: AC
  Administered 2017-02-28: 11:00:00 via INTRAVENOUS

## 2017-02-28 MED ORDER — SODIUM CHLORIDE 0.9 % IV SOLN
1000.0000 mg | Freq: Once | INTRAVENOUS | Status: AC
  Administered 2017-02-28: 1000 mg via INTRAVENOUS
  Filled 2017-02-28: qty 40

## 2017-02-28 MED ORDER — DIPHENHYDRAMINE HCL 50 MG/ML IJ SOLN
INTRAMUSCULAR | Status: AC
  Filled 2017-02-28: qty 1

## 2017-02-28 NOTE — Patient Instructions (Signed)
[facility] Discharge Instructions for Patients Receiving Chemotherapy  Today you received the following chemotherapy agents [facility] To help prevent nausea and vomiting after your treatment, we encourage you to take your nausea medication as prescribed.   If you develop nausea and vomiting that is not controlled by your nausea medication, call the clinic.   BELOW ARE SYMPTOMS THAT SHOULD BE REPORTED IMMEDIATELY:  *FEVER GREATER THAN 100.5 F  *CHILLS WITH OR WITHOUT FEVER  NAUSEA AND VOMITING THAT IS NOT CONTROLLED WITH YOUR NAUSEA MEDICATION  *UNUSUAL SHORTNESS OF BREATH  *UNUSUAL BRUISING OR BLEEDING  TENDERNESS IN MOUTH AND THROAT WITH OR WITHOUT PRESENCE OF ULCERS  *URINARY PROBLEMS  *BOWEL PROBLEMS  UNUSUAL RASH Items with * indicate a potential emergency and should be followed up as soon as possible.  Feel free to call the clinic you have any questions or concerns. The clinic phone number is [phone].  Please show the [facility] at check-in to the Emergency Department and triage nurse.

## 2017-02-28 NOTE — Assessment & Plan Note (Signed)
This is likely due to recent treatment. The patient denies recent history of fevers, cough, chills, diarrhea or dysuria. She is asymptomatic from the leukopenia. I will observe for now.  I will continue the chemotherapy at current dose without dosage adjustment.  If the leukopenia gets progressive worse in the future, I might have to delay her treatment or adjust the chemotherapy dose.   

## 2017-02-28 NOTE — Assessment & Plan Note (Signed)
This is likely related to fluid retention from prednisone and low albumin state. Observe only

## 2017-02-28 NOTE — Progress Notes (Signed)
[facility] OFFICE PROGRESS NOTE  Patient Care Team: Kelton Pillar, MD as PCP - General (Family Medicine)  SUMMARY OF ONCOLOGIC HISTORY:   CLL (chronic lymphocytic leukemia) ([place])   11/29/2001 Pathology Results    [phone] Left neck biopsy: LEFT NECK LYMPH NODE, EXCISION: SMALL LYMPHOCYTIC LYMPHOMA/CHRONIC LYMPHOCYTIC LEUKEMIA, SEE COMMENT  COMMENT Sections show effacement of the lymph node architecture by uniform small lymphoid cells displaying chromatin clumping and small to inconspicuous nucleoli. This is associated with numerous proliferation centers (pseudofollicular centers). There is no evidence of high grade transformation. Flow cytometric analysis showed a monoclonal, lambda restricted B cell population expressing pan B cell antigens including CD20 and CD23 with coexpression of CD5 and CD20. 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      01/04/2002 Bone Marrow Biopsy    BONE MARROW, TOUCH IMPRINTS AND CORE BIOPSY: INVOLVEMENT BY SMALL LYMPHOCYTIC LYMPHOMA/CHRONIC LYMPHOCYTIC LEUKEMIA      01/06/2004 Miscellaneous    She was treated for the CLL with Rituxan in 2005/ 2006 and again Jan 2008 thru Feb 2009,with good response, butheld then due to complaints of transient short term memory loss after each RItuxan treatment      10/31/2016 PET scan    Diffuse cervical, axillary, retroperitoneal, and pelvic adenopathy is noted which exhibits very mild increased FDG uptake. I suspect the mild level of increased FDG uptake reflects the inherent hypometabolic state of the tumor as opposed to lack of viable tumor.  2. Brown fat within bilateral supraclavicular and paraspinal fat.      11/12/2016 Imaging    Ct scan showed significant progression of bulky confluent retroperitoneal and bilateral pelvic lymphadenopathy. Mild progression of thoracic lymphadenopathy involving the bilateral axillary, mediastinal and bilateral  hilar chains. 2. New extensive fine perilymphatic distribution nodularity and interlobular septal thickening throughout both lungs, most consistent with lymphangitic tumor, as can be seen in lymphoproliferative disorders. 3. Normal size spleen. 4. Additional findings include aortic atherosclerosis, stable 2.0 cm inferior thyroid isthmus nodule mild sigmoid diverticulosis, mild superior L1 vertebral compression fracture of indeterminate chronicity (new since 03/06/2013), and increased size of moderate midline high ventral abdominal wall hernia containing fat and fluid.      12/31/2016 Pathology Results    Lymph node for lymphoma, left supraclavicular - SMALL LYMPHOCYTIC LYMPHOMA. - SEE ONCOLOGY TABLE. Microscopic Comment LYMPHOMA Histologic type: Non-Hodgkin lymphoma, small lymphocytic type. Grade (if applicable): Low grade. Flow cytometry: Monoclonal, lambda restricted B-cell population expressing pan B-cell antigens including CD20 associated with dim CD5 expression. No CD10 expression is identified ([id number]). Immunohistochemical stains: CD10, CD20, CD3, CD43, CD5, CD79a, and cyclin D1 with appropriate controls. Touch preps/imprints: Predominance of small lymphoid cells with high nuclear cytoplasmic ratio, coarse chromatin and small to inconspicuous nucleoli admixed with larger lymphoid cells to a much lesser extent. Comments: The sections show lymph nodal tissue displaying effacement of the architecture by a lymphoproliferative process characterized by predominance of small lymphoid cells with high nuclear cytoplasmic ratio, round to slightly irregular nuclei, coarse chromatin and small to inconspicuous nucleoli. This is associated with abundant variably sized proliferation centers (pseudofollicular centers) imparting a vaguely nodular pattern. Flow cytometric analysis shows a monoclonal, lambda restricted B-cell population expressing pan B-cell antigens associated with dim CD5 expression. In addition,  immunohistochemical stains were performed and show that the lymphoid cells are predominantly composed of B cells as seen with CD20 and CD79a associated with co-expression with CD43 and weak co-expression with CD5. No significant CD10 or cyclin D1 positivity  is identified. There is admixed T-cell population to a lesser extent as primarily seen with CD3. The findings are consistent with involvement by small lymphocytic lymphoma/chronic lymphocytic leukemia. Clinical correlation is recommended. (BNS:ecj 01/04/2017)      12/31/2016 Surgery    She had excisional biopsy left supraclavicular lymph nodes      02/14/2017 -  Chemotherapy    The patient had treatment with Gazyva. She has delay in starting treatment due to inability to get insurance approval for ibrutinib.      02/21/2017 Adverse Reaction    Treatment was delayed by 1 week due to pancytopenia       INTERVAL HISTORY: Please see below for problem oriented charting. She returns for further follow-up. She complained a little week.  Lymphadenopathy has regressed in size.  She complained persistent bilateral lower extremity edema. Denies recent infection. The patient denies any recent signs or symptoms of bleeding such as spontaneous epistaxis, hematuria or hematochezia.   REVIEW OF SYSTEMS:   Constitutional: Denies fevers, chills or abnormal weight loss Eyes: Denies blurriness of vision Ears, nose, mouth, throat, and face: Denies mucositis or sore throat Respiratory: Denies cough, dyspnea or wheezes Cardiovascular: Denies palpitation, chest discomfort  Gastrointestinal:  Denies nausea, heartburn or change in bowel habits Skin: Denies abnormal skin rashes Lymphatics: Denies new lymphadenopathy or easy bruising Neurological:Denies numbness, tingling or new weaknesses Behavioral/Psych: Mood is stable, no new changes  All other systems were reviewed with the patient and are negative.  I have reviewed the past medical history, past  surgical history, social history and family history with the patient and they are unchanged from previous note.  ALLERGIES:  is allergic to gluten meal; influenza virus vacc split pf; nutrasweet aspartame [aspartame]; salicylates cross reactors; and sulfa antibiotics.  MEDICATIONS:  Current Outpatient Prescriptions  Medication Sig Dispense Refill  . acyclovir (ZOVIRAX) 400 MG tablet Take 1 tablet (400 mg total) by mouth daily. 30 tablet 6  . allopurinol (ZYLOPRIM) 300 MG tablet Take 1 tablet (300 mg total) by mouth daily. 30 tablet 1  . Calcium Carbonate-Vitamin D (CALCIUM 600+D) 600-200 MG-UNIT TABS Take 1 tablet by mouth 2 (two) times daily.    . Multiple Vitamin (MULTIVITAMIN WITH MINERALS) TABS tablet Take 1 tablet by mouth daily.    Marland Kitchen HYDROcodone-acetaminophen (NORCO/VICODIN) 5-325 MG tablet Take 1 tablet by mouth every 6 (six) hours as needed for moderate pain. (Patient not taking: Reported on 01/24/2017) 30 tablet 0  . ondansetron (ZOFRAN ODT) 4 MG disintegrating tablet 69m ODT q6 hours prn nausea/vomit (Patient not taking: Reported on 02/28/2017) 8 tablet 0  . prednisoLONE acetate (PRED FORTE) 1 % ophthalmic suspension Place 1 drop into the right eye daily.  2  . predniSONE (DELTASONE) 20 MG tablet Take 1 tablet (20 mg total) by mouth daily with breakfast. (Patient not taking: Reported on 02/19/2017) 30 tablet 1   No current facility-administered medications for this visit.     PHYSICAL EXAMINATION: ECOG PERFORMANCE STATUS: 1 - Symptomatic but completely ambulatory  Vitals:   02/28/17 0943  BP: (!) 126/59  Pulse: 92  Resp: 17  Temp: 98 F (36.7 C)   Filed Weights   02/28/17 0943  Weight: 118 lb 12.8 oz (53.9 kg)    GENERAL:alert, no distress and comfortable SKIN: skin color, texture, turgor are normal, no rashes or significant lesions EYES: normal, Conjunctiva are pink and non-injected, sclera clear OROPHARYNX:no exudate, no erythema and lips, buccal mucosa, and tongue  normal  NECK: supple, thyroid normal size,  non-tender, without nodularity LYMPH: Lymphadenopathy has regressed in size LUNGS: clear to auscultation and percussion with normal breathing effort HEART: regular rate & rhythm and no murmurs with moderate bilateral lower extremity edema ABDOMEN:abdomen soft, non-tender and normal bowel sounds Musculoskeletal:no cyanosis of digits and no clubbing  NEURO: alert & oriented x 3 with fluent speech, no focal motor/sensory deficits  LABORATORY DATA:  I have reviewed the data as listed    Component Value Date/Time   NA 141 02/28/2017 0916   K 4.1 02/28/2017 0916   CL 99 (L) 02/19/2017 0951   CL 106 03/21/2013 1302   CO2 29 02/28/2017 0916   GLUCOSE 93 02/28/2017 0916   GLUCOSE 87 03/21/2013 1302   BUN 7.4 02/28/2017 0916   CREATININE 0.7 02/28/2017 0916   CALCIUM 9.2 02/28/2017 0916   PROT 5.5 (L) 02/28/2017 0916   ALBUMIN 3.2 (L) 02/28/2017 0916   AST 59 (H) 02/28/2017 0916   ALT 28 02/28/2017 0916   ALKPHOS 119 02/28/2017 0916   BILITOT 1.04 02/28/2017 0916   GFRNONAA >60 02/19/2017 0951   GFRAA >60 02/19/2017 0951    No results found for: SPEP, UPEP  Lab Results  Component Value Date   WBC 1.9 (L) 02/28/2017   NEUTROABS 0.9 (L) 02/28/2017   HGB 11.3 (L) 02/28/2017   HCT 34.5 (L) 02/28/2017   MCV 98.0 02/28/2017   PLT 109 (L) 02/28/2017      Chemistry      Component Value Date/Time   NA 141 02/28/2017 0916   K 4.1 02/28/2017 0916   CL 99 (L) 02/19/2017 0951   CL 106 03/21/2013 1302   CO2 29 02/28/2017 0916   BUN 7.4 02/28/2017 0916   CREATININE 0.7 02/28/2017 0916      Component Value Date/Time   CALCIUM 9.2 02/28/2017 0916   ALKPHOS 119 02/28/2017 0916   AST 59 (H) 02/28/2017 0916   ALT 28 02/28/2017 0916   BILITOT 1.04 02/28/2017 0916       RADIOGRAPHIC STUDIES: I have personally reviewed the radiological images as listed and agreed with the findings in the report. Dg Chest 2 View  Result Date:  02/19/2017 CLINICAL DATA:  Left-sided chest congestion, shortness of breath and weakness. EXAM: CHEST  2 VIEW COMPARISON:  CT chest 11/12/2016 and chest radiograph 02/03/2016. FINDINGS: Trachea is midline. Heart size stable. Thoracic aorta is calcified. Biapical and likely bibasilar scarring. Lungs are somewhat hyperinflated but otherwise clear. IMPRESSION: No acute findings. Electronically Signed   By: Lorin Picket M.D.   On: 02/19/2017 10:15   Ct Angio Chest Pe W And/or Wo Contrast  Result Date: 02/19/2017 CLINICAL DATA:  76 year old female with shortness of breath, abdominal and pelvic pain and vomiting for 3 days. History of CLL. EXAM: CT ANGIOGRAPHY CHEST CT ABDOMEN AND PELVIS WITH CONTRAST TECHNIQUE: Multidetector CT imaging of the chest was performed using the standard protocol during bolus administration of intravenous contrast. Multiplanar CT image reconstructions and MIPs were obtained to evaluate the vascular anatomy. Multidetector CT imaging of the abdomen and pelvis was performed using the standard protocol during bolus administration of intravenous contrast. CONTRAST:  100 cc intravenous Omnipaque 300 COMPARISON:  11/12/2016 and prior CTs FINDINGS: CTA CHEST FINDINGS Cardiovascular: This is a technically adequate study. No pulmonary emboli or thoracic aortic aneurysm identified. Cardiomegaly is present. No pericardial effusion identified. Mediastinum/Nodes: Multiple enlarged lymph nodes within both axillary, hilar and subcarinal regions are unchanged. No mediastinal mass is identified. Lungs/Pleura: Interstitial opacities are unchanged. No airspace disease,  consolidation, pleural effusion or pneumothorax identified. Musculoskeletal: No acute or suspicious abnormality. Review of the MIP images confirms the above findings. CT ABDOMEN and PELVIS FINDINGS Hepatobiliary: The liver and gallbladder are unremarkable. There is no evidence of biliary dilatation. Pancreas: Unremarkable Spleen: Unremarkable  Adrenals/Urinary Tract: The kidneys, adrenal glands and bladder are unremarkable. Stomach/Bowel: Stomach is within normal limits. Appendix appears normal. No evidence of bowel wall thickening, distention, or inflammatory changes. Vascular/Lymphatic: Moderate to marked lymphadenopathy within the abdomen, retroperitoneum and pelvis again noted and not significantly changed. Index node includes the following: 3.4 cm right retroperitoneal node (image 30) 4.4 cm retroperitoneal/abdominal node (image 37) 2.1 cm left common iliac node (image 42) 3.8 cm right common iliac node (image 43) No new adenopathy identified. There is no evidence of abdominal aortic aneurysm. Reproductive: Uterus and adnexal regions are unremarkable. Other: No free fluid, pneumoperitoneum or abscess. Musculoskeletal: No acute or significant osseous findings. Mild compression of the L1 superior endplate is unchanged. Mild to moderate degenerative disc disease throughout the lumbar spine again noted. Review of the MIP images confirms the above findings. IMPRESSION: No evidence of acute abnormality. Unchanged lymphadenopathy throughout the chest abdomen and pelvis compatible with this patient's history of CLL. Chronic bilateral interstitial pulmonary opacities. Electronically Signed   By: Margarette Canada M.D.   On: 02/19/2017 12:47   Ct Abdomen Pelvis W Contrast  Result Date: 02/19/2017 CLINICAL DATA:  76 year old female with shortness of breath, abdominal and pelvic pain and vomiting for 3 days. History of CLL. EXAM: CT ANGIOGRAPHY CHEST CT ABDOMEN AND PELVIS WITH CONTRAST TECHNIQUE: Multidetector CT imaging of the chest was performed using the standard protocol during bolus administration of intravenous contrast. Multiplanar CT image reconstructions and MIPs were obtained to evaluate the vascular anatomy. Multidetector CT imaging of the abdomen and pelvis was performed using the standard protocol during bolus administration of intravenous contrast.  CONTRAST:  100 cc intravenous Omnipaque 300 COMPARISON:  11/12/2016 and prior CTs FINDINGS: CTA CHEST FINDINGS Cardiovascular: This is a technically adequate study. No pulmonary emboli or thoracic aortic aneurysm identified. Cardiomegaly is present. No pericardial effusion identified. Mediastinum/Nodes: Multiple enlarged lymph nodes within both axillary, hilar and subcarinal regions are unchanged. No mediastinal mass is identified. Lungs/Pleura: Interstitial opacities are unchanged. No airspace disease, consolidation, pleural effusion or pneumothorax identified. Musculoskeletal: No acute or suspicious abnormality. Review of the MIP images confirms the above findings. CT ABDOMEN and PELVIS FINDINGS Hepatobiliary: The liver and gallbladder are unremarkable. There is no evidence of biliary dilatation. Pancreas: Unremarkable Spleen: Unremarkable Adrenals/Urinary Tract: The kidneys, adrenal glands and bladder are unremarkable. Stomach/Bowel: Stomach is within normal limits. Appendix appears normal. No evidence of bowel wall thickening, distention, or inflammatory changes. Vascular/Lymphatic: Moderate to marked lymphadenopathy within the abdomen, retroperitoneum and pelvis again noted and not significantly changed. Index node includes the following: 3.4 cm right retroperitoneal node (image 30) 4.4 cm retroperitoneal/abdominal node (image 37) 2.1 cm left common iliac node (image 42) 3.8 cm right common iliac node (image 43) No new adenopathy identified. There is no evidence of abdominal aortic aneurysm. Reproductive: Uterus and adnexal regions are unremarkable. Other: No free fluid, pneumoperitoneum or abscess. Musculoskeletal: No acute or significant osseous findings. Mild compression of the L1 superior endplate is unchanged. Mild to moderate degenerative disc disease throughout the lumbar spine again noted. Review of the MIP images confirms the above findings. IMPRESSION: No evidence of acute abnormality. Unchanged  lymphadenopathy throughout the chest abdomen and pelvis compatible with this patient's history of CLL. Chronic bilateral  interstitial pulmonary opacities. Electronically Signed   By: Margarette Canada M.D.   On: 02/19/2017 12:47    ASSESSMENT & PLAN:  CLL (chronic lymphocytic leukemia) ([place]) Her treatment was recently complicated by pancytopenia, improved She also complained of bilateral leg swelling. I would proceed with treatment today without delay.  I will see her back in 2 weeks for further supportive care  Leukopenia due to antineoplastic chemotherapy [facility]) This is likely due to recent treatment. The patient denies recent history of fevers, cough, chills, diarrhea or dysuria. She is asymptomatic from the leukopenia. I will observe for now.  I will continue the chemotherapy at current dose without dosage adjustment.  If the leukopenia gets progressive worse in the future, I might have to delay her treatment or adjust the chemotherapy dose.    Thrombocytopenia ([place]) This is likely due to recent treatment. The patient denies recent history of bleeding such as epistaxis, hematuria or hematochezia. She is asymptomatic from the low platelet count. I will observe for now.  she does not require transfusion now. I will continue the chemotherapy at current dose without dosage adjustment.  If the thrombocytopenia gets progressive worse in the future, I might have to delay her treatment or adjust the chemotherapy dose.    Anemia due to antineoplastic chemotherapy This is likely due to recent treatment. The patient denies recent history of bleeding such as epistaxis, hematuria or hematochezia. She is asymptomatic from the anemia. I will observe for now.  She does not require transfusion now. I will continue the chemotherapy at current dose without dosage adjustment.  If the anemia gets progressive worse in the future, I might have to delay her treatment or adjust the chemotherapy dose.   Bilateral leg  edema This is likely related to fluid retention from prednisone and low albumin state. Observe only   No orders of the defined types were placed in this encounter.  All questions were answered. The patient knows to call the clinic with any problems, questions or concerns. No barriers to learning was detected. I spent 20 minutes counseling the patient face to face. The total time spent in the appointment was 30 minutes and more than 50% was on counseling and review of test results     Heath Lark, MD 02/28/2017 12:36 PM

## 2017-02-28 NOTE — Assessment & Plan Note (Signed)

## 2017-02-28 NOTE — Assessment & Plan Note (Signed)
Her treatment was recently complicated by pancytopenia, improved She also complained of bilateral leg swelling. I would proceed with treatment today without delay.  I will see her back in 2 weeks for further supportive care

## 2017-02-28 NOTE — Telephone Encounter (Signed)
Appointments scheduled per 3/5 LOS. Patient given AVS report and calendars with future scheduled appointments. °

## 2017-02-28 NOTE — Assessment & Plan Note (Signed)
This is likely due to recent treatment. The patient denies recent history of bleeding such as epistaxis, hematuria or hematochezia. She is asymptomatic from the low platelet count. I will observe for now.  she does not require transfusion now. I will continue the chemotherapy at current dose without dosage adjustment.  If the thrombocytopenia gets progressive worse in the future, I might have to delay her treatment or adjust the chemotherapy dose.   

## 2017-03-08 ENCOUNTER — Other Ambulatory Visit: Payer: Self-pay | Admitting: *Deleted

## 2017-03-21 ENCOUNTER — Encounter: Payer: Self-pay | Admitting: Hematology and Oncology

## 2017-03-21 ENCOUNTER — Other Ambulatory Visit (HOSPITAL_BASED_OUTPATIENT_CLINIC_OR_DEPARTMENT_OTHER): Payer: Medicare Other

## 2017-03-21 ENCOUNTER — Ambulatory Visit (HOSPITAL_BASED_OUTPATIENT_CLINIC_OR_DEPARTMENT_OTHER): Payer: Medicare Other | Admitting: Hematology and Oncology

## 2017-03-21 ENCOUNTER — Ambulatory Visit (HOSPITAL_BASED_OUTPATIENT_CLINIC_OR_DEPARTMENT_OTHER): Payer: Medicare Other

## 2017-03-21 ENCOUNTER — Other Ambulatory Visit: Payer: Self-pay | Admitting: Hematology and Oncology

## 2017-03-21 ENCOUNTER — Telehealth: Payer: Self-pay | Admitting: Hematology and Oncology

## 2017-03-21 VITALS — BP 128/53 | HR 89 | Temp 98.6°F | Resp 16

## 2017-03-21 DIAGNOSIS — D709 Neutropenia, unspecified: Secondary | ICD-10-CM | POA: Diagnosis not present

## 2017-03-21 DIAGNOSIS — R600 Localized edema: Secondary | ICD-10-CM | POA: Diagnosis not present

## 2017-03-21 DIAGNOSIS — E440 Moderate protein-calorie malnutrition: Secondary | ICD-10-CM | POA: Diagnosis not present

## 2017-03-21 DIAGNOSIS — R634 Abnormal weight loss: Secondary | ICD-10-CM

## 2017-03-21 DIAGNOSIS — C9110 Chronic lymphocytic leukemia of B-cell type not having achieved remission: Secondary | ICD-10-CM

## 2017-03-21 DIAGNOSIS — Z5112 Encounter for antineoplastic immunotherapy: Secondary | ICD-10-CM

## 2017-03-21 DIAGNOSIS — D72819 Decreased white blood cell count, unspecified: Secondary | ICD-10-CM | POA: Diagnosis not present

## 2017-03-21 DIAGNOSIS — D702 Other drug-induced agranulocytosis: Secondary | ICD-10-CM

## 2017-03-21 DIAGNOSIS — Z7189 Other specified counseling: Secondary | ICD-10-CM

## 2017-03-21 LAB — COMPREHENSIVE METABOLIC PANEL
ALBUMIN: 3.5 g/dL (ref 3.5–5.0)
ALK PHOS: 122 U/L (ref 40–150)
ALT: 29 U/L (ref 0–55)
AST: 57 U/L — AB (ref 5–34)
Anion Gap: 8 mEq/L (ref 3–11)
BUN: 10.9 mg/dL (ref 7.0–26.0)
CO2: 27 mEq/L (ref 22–29)
Calcium: 9.6 mg/dL (ref 8.4–10.4)
Chloride: 107 mEq/L (ref 98–109)
Creatinine: 0.7 mg/dL (ref 0.6–1.1)
EGFR: >90 mL/min/{1.73_m2} (ref >90)
GLUCOSE: 91 mg/dL (ref 70–140)
Potassium: 4.1 mEq/L (ref 3.5–5.1)
SODIUM: 142 meq/L (ref 136–145)
Total Bilirubin: 0.71 mg/dL (ref 0.20–1.20)
Total Protein: 5.9 g/dL — ABNORMAL LOW (ref 6.4–8.3)

## 2017-03-21 LAB — CBC WITH DIFFERENTIAL/PLATELET
BASO%: 2 % (ref 0.0–2.0)
Basophils Absolute: 0 10*3/uL (ref 0.0–0.1)
EOS%: 2.9 % (ref 0.0–7.0)
Eosinophils Absolute: 0.1 10*3/uL (ref 0.0–0.5)
HCT: 37.2 % (ref 34.8–46.6)
HEMOGLOBIN: 12.3 g/dL (ref 11.6–15.9)
LYMPH#: 0.7 10*3/uL — AB (ref 0.9–3.3)
LYMPH%: 35.2 % (ref 14.0–49.7)
MCH: 32.7 pg (ref 25.1–34.0)
MCHC: 33.1 g/dL (ref 31.5–36.0)
MCV: 98.9 fL (ref 79.5–101.0)
MONO#: 0.2 10*3/uL (ref 0.1–0.9)
MONO%: 10.6 % (ref 0.0–14.0)
NEUT%: 49.3 % (ref 38.4–76.8)
NEUTROS ABS: 0.9 10*3/uL — AB (ref 1.5–6.5)
Platelets: 159 10*3/uL (ref 145–400)
RBC: 3.76 10*6/uL (ref 3.70–5.45)
RDW: 16.2 % — ABNORMAL HIGH (ref 11.2–14.5)
WBC: 1.9 10*3/uL — ABNORMAL LOW (ref 3.9–10.3)

## 2017-03-21 LAB — LACTATE DEHYDROGENASE: LDH: 230 U/L (ref 125–245)

## 2017-03-21 LAB — URIC ACID: Uric Acid, Serum: 5.1 mg/dl (ref 2.6–7.4)

## 2017-03-21 MED ORDER — DIPHENHYDRAMINE HCL 25 MG PO CAPS
ORAL_CAPSULE | ORAL | Status: AC
  Filled 2017-03-21: qty 2

## 2017-03-21 MED ORDER — SODIUM CHLORIDE 0.9 % IV SOLN
20.0000 mg | Freq: Once | INTRAVENOUS | Status: AC
  Administered 2017-03-21: 20 mg via INTRAVENOUS
  Filled 2017-03-21: qty 2

## 2017-03-21 MED ORDER — ACETAMINOPHEN 325 MG PO TABS
ORAL_TABLET | ORAL | Status: AC
  Filled 2017-03-21: qty 2

## 2017-03-21 MED ORDER — DIPHENHYDRAMINE HCL 50 MG/ML IJ SOLN
INTRAMUSCULAR | Status: AC
  Filled 2017-03-21: qty 1

## 2017-03-21 MED ORDER — ACETAMINOPHEN 325 MG PO TABS
650.0000 mg | ORAL_TABLET | Freq: Once | ORAL | Status: AC
  Administered 2017-03-21: 650 mg via ORAL

## 2017-03-21 MED ORDER — SODIUM CHLORIDE 0.9 % IV SOLN
Freq: Once | INTRAVENOUS | Status: AC
  Administered 2017-03-21: 12:00:00 via INTRAVENOUS

## 2017-03-21 MED ORDER — DIPHENHYDRAMINE HCL 50 MG/ML IJ SOLN
50.0000 mg | Freq: Once | INTRAMUSCULAR | Status: AC
  Administered 2017-03-21: 50 mg via INTRAVENOUS

## 2017-03-21 MED ORDER — SODIUM CHLORIDE 0.9 % IV SOLN
1000.0000 mg | Freq: Once | INTRAVENOUS | Status: AC
  Administered 2017-03-21: 1000 mg via INTRAVENOUS
  Filled 2017-03-21: qty 40

## 2017-03-21 NOTE — Assessment & Plan Note (Signed)
This is likely due to recent treatment. The patient denies recent history of fevers, cough, chills, diarrhea or dysuria. She is asymptomatic from the leukopenia. I will observe for now.  I will continue the chemotherapy at current dose without dosage adjustment.  If the leukopenia gets progressive worse in the future, I might have to delay her treatment or adjust the chemotherapy dose. We discussed neutropenic precaution and she will continue acyclovir

## 2017-03-21 NOTE — Assessment & Plan Note (Signed)
She has bilateral leg edema and recent poor oral intake with weight loss. We discussed nutritional supplement. If her weight loss does not improve, I will start her back on low-dose steroids.  She will try to increase oral intake as tolerated

## 2017-03-21 NOTE — Patient Instructions (Signed)
[facility] Discharge Instructions for Patients Receiving Chemotherapy  Today you received the following chemotherapy agents:  Gazyva (obinutuzumab)  To help prevent nausea and vomiting after your treatment, we encourage you to take your nausea medication as prescribed.   If you develop nausea and vomiting that is not controlled by your nausea medication, call the clinic.   BELOW ARE SYMPTOMS THAT SHOULD BE REPORTED IMMEDIATELY:  *FEVER GREATER THAN 100.5 F  *CHILLS WITH OR WITHOUT FEVER  NAUSEA AND VOMITING THAT IS NOT CONTROLLED WITH YOUR NAUSEA MEDICATION  *UNUSUAL SHORTNESS OF BREATH  *UNUSUAL BRUISING OR BLEEDING  TENDERNESS IN MOUTH AND THROAT WITH OR WITHOUT PRESENCE OF ULCERS  *URINARY PROBLEMS  *BOWEL PROBLEMS  UNUSUAL RASH Items with * indicate a potential emergency and should be followed up as soon as possible.  Feel free to call the clinic you have any questions or concerns. The clinic phone number is [phone].  Please show the [facility] at check-in to the Emergency Department and triage nurse.

## 2017-03-21 NOTE — Telephone Encounter (Signed)
Scheduled appt per 03/21/2017 los. Per Alphonsus Sias, Rn Elmyra Ricks to print out schedule and give to patient after treatment.

## 2017-03-21 NOTE — Progress Notes (Signed)
[facility] OFFICE PROGRESS NOTE  Patient Care Team: Kelton Pillar, MD as PCP - General (Family Medicine)  SUMMARY OF ONCOLOGIC HISTORY:   CLL (chronic lymphocytic leukemia) ([place])   11/29/2001 Pathology Results    [phone] Left neck biopsy: LEFT NECK LYMPH NODE, EXCISION: SMALL LYMPHOCYTIC LYMPHOMA/CHRONIC LYMPHOCYTIC LEUKEMIA, SEE COMMENT  COMMENT Sections show effacement of the lymph node architecture by uniform small lymphoid cells displaying chromatin clumping and small to inconspicuous nucleoli. This is associated with numerous proliferation centers (pseudofollicular centers). There is no evidence of high grade transformation. Flow cytometric analysis showed a monoclonal, lambda restricted B cell population expressing pan B cell antigens including CD20 and CD23 with coexpression of CD5 and CD20. 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      01/04/2002 Bone Marrow Biopsy    BONE MARROW, TOUCH IMPRINTS AND CORE BIOPSY: INVOLVEMENT BY SMALL LYMPHOCYTIC LYMPHOMA/CHRONIC LYMPHOCYTIC LEUKEMIA      01/06/2004 Miscellaneous    She was treated for the CLL with Rituxan in 2005/ 2006 and again Jan 2008 thru Feb 2009,with good response, butheld then due to complaints of transient short term memory loss after each RItuxan treatment      10/31/2016 PET scan    Diffuse cervical, axillary, retroperitoneal, and pelvic adenopathy is noted which exhibits very mild increased FDG uptake. I suspect the mild level of increased FDG uptake reflects the inherent hypometabolic state of the tumor as opposed to lack of viable tumor.  2. Brown fat within bilateral supraclavicular and paraspinal fat.      11/12/2016 Imaging    Ct scan showed significant progression of bulky confluent retroperitoneal and bilateral pelvic lymphadenopathy. Mild progression of thoracic lymphadenopathy involving the bilateral axillary, mediastinal and bilateral  hilar chains. 2. New extensive fine perilymphatic distribution nodularity and interlobular septal thickening throughout both lungs, most consistent with lymphangitic tumor, as can be seen in lymphoproliferative disorders. 3. Normal size spleen. 4. Additional findings include aortic atherosclerosis, stable 2.0 cm inferior thyroid isthmus nodule mild sigmoid diverticulosis, mild superior L1 vertebral compression fracture of indeterminate chronicity (new since 03/06/2013), and increased size of moderate midline high ventral abdominal wall hernia containing fat and fluid.      12/31/2016 Pathology Results    Lymph node for lymphoma, left supraclavicular - SMALL LYMPHOCYTIC LYMPHOMA. - SEE ONCOLOGY TABLE. Microscopic Comment LYMPHOMA Histologic type: Non-Hodgkin lymphoma, small lymphocytic type. Grade (if applicable): Low grade. Flow cytometry: Monoclonal, lambda restricted B-cell population expressing pan B-cell antigens including CD20 associated with dim CD5 expression. No CD10 expression is identified ([id number]). Immunohistochemical stains: CD10, CD20, CD3, CD43, CD5, CD79a, and cyclin D1 with appropriate controls. Touch preps/imprints: Predominance of small lymphoid cells with high nuclear cytoplasmic ratio, coarse chromatin and small to inconspicuous nucleoli admixed with larger lymphoid cells to a much lesser extent. Comments: The sections show lymph nodal tissue displaying effacement of the architecture by a lymphoproliferative process characterized by predominance of small lymphoid cells with high nuclear cytoplasmic ratio, round to slightly irregular nuclei, coarse chromatin and small to inconspicuous nucleoli. This is associated with abundant variably sized proliferation centers (pseudofollicular centers) imparting a vaguely nodular pattern. Flow cytometric analysis shows a monoclonal, lambda restricted B-cell population expressing pan B-cell antigens associated with dim CD5 expression. In addition,  immunohistochemical stains were performed and show that the lymphoid cells are predominantly composed of B cells as seen with CD20 and CD79a associated with co-expression with CD43 and weak co-expression with CD5. No significant CD10 or cyclin D1 positivity  is identified. There is admixed T-cell population to a lesser extent as primarily seen with CD3. The findings are consistent with involvement by small lymphocytic lymphoma/chronic lymphocytic leukemia. Clinical correlation is recommended. (BNS:ecj 01/04/2017)      12/31/2016 Surgery    She had excisional biopsy left supraclavicular lymph nodes      02/14/2017 -  Chemotherapy    The patient had treatment with Gazyva. She has delay in starting treatment due to inability to get insurance approval for ibrutinib.      02/21/2017 Adverse Reaction    Treatment was delayed by 1 week due to pancytopenia       INTERVAL HISTORY: Please see below for problem oriented charting. She returns with her husband to begin day 15 of treatment. Her recent treatment was delayed due to pancytopenia. She denies recent infection, cough, diarrhea or sore throat. She continues to have poor oral intake and have lost some weight.  She has persistent bilateral lower extremity edema No recent lymphadenopathy  REVIEW OF SYSTEMS:   Constitutional: Denies fevers, chills Eyes: Denies blurriness of vision Ears, nose, mouth, throat, and face: Denies mucositis or sore throat Respiratory: Denies cough, dyspnea or wheezes Cardiovascular: Denies palpitation, chest discomfort Gastrointestinal:  Denies nausea, heartburn or change in bowel habits Skin: Denies abnormal skin rashes Lymphatics: Denies new lymphadenopathy or easy bruising Neurological:Denies numbness, tingling or new weaknesses Behavioral/Psych: Mood is stable, no new changes  All other systems were reviewed with the patient and are negative.  I have reviewed the past medical history, past surgical history,  social history and family history with the patient and they are unchanged from previous note.  ALLERGIES:  is allergic to gluten meal; influenza virus vacc split pf; nutrasweet aspartame [aspartame]; salicylates cross reactors; and sulfa antibiotics.  MEDICATIONS:  Current Outpatient Prescriptions  Medication Sig Dispense Refill  . allopurinol (ZYLOPRIM) 300 MG tablet Take 1 tablet (300 mg total) by mouth daily. 30 tablet 1  . Calcium Carbonate-Vitamin D (CALCIUM 600+D) 600-200 MG-UNIT TABS Take 1 tablet by mouth 2 (two) times daily.    . Multiple Vitamin (MULTIVITAMIN WITH MINERALS) TABS tablet Take 1 tablet by mouth daily.    . Olopatadine HCl (PAZEO OP) Apply to eye.    Marland Kitchen HYDROcodone-acetaminophen (NORCO/VICODIN) 5-325 MG tablet Take 1 tablet by mouth every 6 (six) hours as needed for moderate pain. (Patient not taking: Reported on 01/24/2017) 30 tablet 0  . ondansetron (ZOFRAN ODT) 4 MG disintegrating tablet '4mg'$  ODT q6 hours prn nausea/vomit (Patient not taking: Reported on 02/28/2017) 8 tablet 0  . predniSONE (DELTASONE) 20 MG tablet Take 1 tablet (20 mg total) by mouth daily with breakfast. (Patient not taking: Reported on 02/19/2017) 30 tablet 1   No current facility-administered medications for this visit.     PHYSICAL EXAMINATION: ECOG PERFORMANCE STATUS: 1 - Symptomatic but completely ambulatory  Vitals:   03/21/17 1051  BP: 131/64  Pulse: 80  Resp: 18  Temp: 97.9 F (36.6 C)   Filed Weights   03/21/17 1051  Weight: 116 lb 8 oz (52.8 kg)    GENERAL:alert, no distress and comfortable SKIN: skin color, texture, turgor are normal, no rashes or significant lesions EYES: normal, Conjunctiva are pink and non-injected, sclera clear OROPHARYNX:no exudate, no erythema and lips, buccal mucosa, and tongue normal  NECK: supple, thyroid normal size, non-tender, without nodularity LYMPH:  no palpable lymphadenopathy in the cervical, axillary or inguinal LUNGS: clear to auscultation  and percussion with normal breathing effort HEART: regular rate &  rhythm and no murmurs with moderate bilateral lower extremity edema ABDOMEN:abdomen soft, non-tender and normal bowel sounds Musculoskeletal:no cyanosis of digits and no clubbing  NEURO: alert & oriented x 3 with fluent speech, no focal motor/sensory deficits  LABORATORY DATA:  I have reviewed the data as listed    Component Value Date/Time   NA 142 03/21/2017 1008   K 4.1 03/21/2017 1008   CL 99 (L) 02/19/2017 0951   CL 106 03/21/2013 1302   CO2 27 03/21/2017 1008   GLUCOSE 91 03/21/2017 1008   GLUCOSE 87 03/21/2013 1302   BUN 10.9 03/21/2017 1008   CREATININE 0.7 03/21/2017 1008   CALCIUM 9.6 03/21/2017 1008   PROT 5.9 (L) 03/21/2017 1008   ALBUMIN 3.5 03/21/2017 1008   AST 57 (H) 03/21/2017 1008   ALT 29 03/21/2017 1008   ALKPHOS 122 03/21/2017 1008   BILITOT 0.71 03/21/2017 1008   GFRNONAA >60 02/19/2017 0951   GFRAA >60 02/19/2017 0951    No results found for: SPEP, UPEP  Lab Results  Component Value Date   WBC 1.9 (L) 03/21/2017   NEUTROABS 0.9 (L) 03/21/2017   HGB 12.3 03/21/2017   HCT 37.2 03/21/2017   MCV 98.9 03/21/2017   PLT 159 03/21/2017      Chemistry      Component Value Date/Time   NA 142 03/21/2017 1008   K 4.1 03/21/2017 1008   CL 99 (L) 02/19/2017 0951   CL 106 03/21/2013 1302   CO2 27 03/21/2017 1008   BUN 10.9 03/21/2017 1008   CREATININE 0.7 03/21/2017 1008      Component Value Date/Time   CALCIUM 9.6 03/21/2017 1008   ALKPHOS 122 03/21/2017 1008   AST 57 (H) 03/21/2017 1008   ALT 29 03/21/2017 1008   BILITOT 0.71 03/21/2017 1008       RADIOGRAPHIC STUDIES: I have personally reviewed the radiological images as listed and agreed with the findings in the report. Ct Angio Chest Pe W And/or Wo Contrast  Result Date: 02/19/2017 CLINICAL DATA:  76 year old female with shortness of breath, abdominal and pelvic pain and vomiting for 3 days. History of CLL. EXAM: CT  ANGIOGRAPHY CHEST CT ABDOMEN AND PELVIS WITH CONTRAST TECHNIQUE: Multidetector CT imaging of the chest was performed using the standard protocol during bolus administration of intravenous contrast. Multiplanar CT image reconstructions and MIPs were obtained to evaluate the vascular anatomy. Multidetector CT imaging of the abdomen and pelvis was performed using the standard protocol during bolus administration of intravenous contrast. CONTRAST:  100 cc intravenous Omnipaque 300 COMPARISON:  11/12/2016 and prior CTs FINDINGS: CTA CHEST FINDINGS Cardiovascular: This is a technically adequate study. No pulmonary emboli or thoracic aortic aneurysm identified. Cardiomegaly is present. No pericardial effusion identified. Mediastinum/Nodes: Multiple enlarged lymph nodes within both axillary, hilar and subcarinal regions are unchanged. No mediastinal mass is identified. Lungs/Pleura: Interstitial opacities are unchanged. No airspace disease, consolidation, pleural effusion or pneumothorax identified. Musculoskeletal: No acute or suspicious abnormality. Review of the MIP images confirms the above findings. CT ABDOMEN and PELVIS FINDINGS Hepatobiliary: The liver and gallbladder are unremarkable. There is no evidence of biliary dilatation. Pancreas: Unremarkable Spleen: Unremarkable Adrenals/Urinary Tract: The kidneys, adrenal glands and bladder are unremarkable. Stomach/Bowel: Stomach is within normal limits. Appendix appears normal. No evidence of bowel wall thickening, distention, or inflammatory changes. Vascular/Lymphatic: Moderate to marked lymphadenopathy within the abdomen, retroperitoneum and pelvis again noted and not significantly changed. Index node includes the following: 3.4 cm right retroperitoneal node (image 30) 4.4  cm retroperitoneal/abdominal node (image 37) 2.1 cm left common iliac node (image 42) 3.8 cm right common iliac node (image 43) No new adenopathy identified. There is no evidence of abdominal aortic  aneurysm. Reproductive: Uterus and adnexal regions are unremarkable. Other: No free fluid, pneumoperitoneum or abscess. Musculoskeletal: No acute or significant osseous findings. Mild compression of the L1 superior endplate is unchanged. Mild to moderate degenerative disc disease throughout the lumbar spine again noted. Review of the MIP images confirms the above findings. IMPRESSION: No evidence of acute abnormality. Unchanged lymphadenopathy throughout the chest abdomen and pelvis compatible with this patient's history of CLL. Chronic bilateral interstitial pulmonary opacities. Electronically Signed   By: Margarette Canada M.D.   On: 02/19/2017 12:47   Ct Abdomen Pelvis W Contrast  Result Date: 02/19/2017 CLINICAL DATA:  76 year old female with shortness of breath, abdominal and pelvic pain and vomiting for 3 days. History of CLL. EXAM: CT ANGIOGRAPHY CHEST CT ABDOMEN AND PELVIS WITH CONTRAST TECHNIQUE: Multidetector CT imaging of the chest was performed using the standard protocol during bolus administration of intravenous contrast. Multiplanar CT image reconstructions and MIPs were obtained to evaluate the vascular anatomy. Multidetector CT imaging of the abdomen and pelvis was performed using the standard protocol during bolus administration of intravenous contrast. CONTRAST:  100 cc intravenous Omnipaque 300 COMPARISON:  11/12/2016 and prior CTs FINDINGS: CTA CHEST FINDINGS Cardiovascular: This is a technically adequate study. No pulmonary emboli or thoracic aortic aneurysm identified. Cardiomegaly is present. No pericardial effusion identified. Mediastinum/Nodes: Multiple enlarged lymph nodes within both axillary, hilar and subcarinal regions are unchanged. No mediastinal mass is identified. Lungs/Pleura: Interstitial opacities are unchanged. No airspace disease, consolidation, pleural effusion or pneumothorax identified. Musculoskeletal: No acute or suspicious abnormality. Review of the MIP images confirms the  above findings. CT ABDOMEN and PELVIS FINDINGS Hepatobiliary: The liver and gallbladder are unremarkable. There is no evidence of biliary dilatation. Pancreas: Unremarkable Spleen: Unremarkable Adrenals/Urinary Tract: The kidneys, adrenal glands and bladder are unremarkable. Stomach/Bowel: Stomach is within normal limits. Appendix appears normal. No evidence of bowel wall thickening, distention, or inflammatory changes. Vascular/Lymphatic: Moderate to marked lymphadenopathy within the abdomen, retroperitoneum and pelvis again noted and not significantly changed. Index node includes the following: 3.4 cm right retroperitoneal node (image 30) 4.4 cm retroperitoneal/abdominal node (image 37) 2.1 cm left common iliac node (image 42) 3.8 cm right common iliac node (image 43) No new adenopathy identified. There is no evidence of abdominal aortic aneurysm. Reproductive: Uterus and adnexal regions are unremarkable. Other: No free fluid, pneumoperitoneum or abscess. Musculoskeletal: No acute or significant osseous findings. Mild compression of the L1 superior endplate is unchanged. Mild to moderate degenerative disc disease throughout the lumbar spine again noted. Review of the MIP images confirms the above findings. IMPRESSION: No evidence of acute abnormality. Unchanged lymphadenopathy throughout the chest abdomen and pelvis compatible with this patient's history of CLL. Chronic bilateral interstitial pulmonary opacities. Electronically Signed   By: Margarette Canada M.D.   On: 02/19/2017 12:47    ASSESSMENT & PLAN:  CLL (chronic lymphocytic leukemia) ([place]) The patient has profound positive clinical response to treatment. She has resolution of anemia and thrombocytopenia. She is still leukopenic. She is not symptomatic. I would proceed with treatment today as scheduled. We discussed neutropenic precaution She will continue prophylactic antimicrobial therapy with acyclovir  Drug-induced neutropenia (HCC) This is  likely due to recent treatment. The patient denies recent history of fevers, cough, chills, diarrhea or dysuria. She is asymptomatic from the leukopenia. I  will observe for now.  I will continue the chemotherapy at current dose without dosage adjustment.  If the leukopenia gets progressive worse in the future, I might have to delay her treatment or adjust the chemotherapy dose. We discussed neutropenic precaution and she will continue acyclovir   Protein-calorie malnutrition, moderate (HCC) She has bilateral leg edema and recent poor oral intake with weight loss. We discussed nutritional supplement. If her weight loss does not improve, I will start her back on low-dose steroids.  She will try to increase oral intake as tolerated   No orders of the defined types were placed in this encounter.  All questions were answered. The patient knows to call the clinic with any problems, questions or concerns. No barriers to learning was detected. I spent 25 minutes counseling the patient face to face. The total time spent in the appointment was 30 minutes and more than 50% was on counseling and review of test results     Heath Lark, MD 03/21/2017 11:50 AM

## 2017-03-21 NOTE — Assessment & Plan Note (Signed)
The patient has profound positive clinical response to treatment. She has resolution of anemia and thrombocytopenia. She is still leukopenic. She is not symptomatic. I would proceed with treatment today as scheduled. We discussed neutropenic precaution She will continue prophylactic antimicrobial therapy with acyclovir

## 2017-03-31 DIAGNOSIS — H20041 Secondary noninfectious iridocyclitis, right eye: Secondary | ICD-10-CM | POA: Diagnosis not present

## 2017-03-31 DIAGNOSIS — H04213 Epiphora due to excess lacrimation, bilateral lacrimal glands: Secondary | ICD-10-CM | POA: Diagnosis not present

## 2017-03-31 DIAGNOSIS — H04223 Epiphora due to insufficient drainage, bilateral lacrimal glands: Secondary | ICD-10-CM | POA: Diagnosis not present

## 2017-03-31 DIAGNOSIS — H401131 Primary open-angle glaucoma, bilateral, mild stage: Secondary | ICD-10-CM | POA: Diagnosis not present

## 2017-04-05 ENCOUNTER — Other Ambulatory Visit (HOSPITAL_BASED_OUTPATIENT_CLINIC_OR_DEPARTMENT_OTHER): Payer: Medicare Other

## 2017-04-05 ENCOUNTER — Ambulatory Visit (HOSPITAL_BASED_OUTPATIENT_CLINIC_OR_DEPARTMENT_OTHER): Payer: Medicare Other | Admitting: Hematology and Oncology

## 2017-04-05 ENCOUNTER — Encounter: Payer: Self-pay | Admitting: Hematology and Oncology

## 2017-04-05 ENCOUNTER — Telehealth: Payer: Self-pay | Admitting: Hematology and Oncology

## 2017-04-05 ENCOUNTER — Ambulatory Visit (HOSPITAL_BASED_OUTPATIENT_CLINIC_OR_DEPARTMENT_OTHER): Payer: Medicare Other

## 2017-04-05 VITALS — BP 125/55 | HR 76 | Temp 98.1°F | Resp 16

## 2017-04-05 DIAGNOSIS — R748 Abnormal levels of other serum enzymes: Secondary | ICD-10-CM

## 2017-04-05 DIAGNOSIS — D696 Thrombocytopenia, unspecified: Secondary | ICD-10-CM

## 2017-04-05 DIAGNOSIS — T451X5A Adverse effect of antineoplastic and immunosuppressive drugs, initial encounter: Secondary | ICD-10-CM

## 2017-04-05 DIAGNOSIS — C9110 Chronic lymphocytic leukemia of B-cell type not having achieved remission: Secondary | ICD-10-CM | POA: Diagnosis not present

## 2017-04-05 DIAGNOSIS — D701 Agranulocytosis secondary to cancer chemotherapy: Secondary | ICD-10-CM | POA: Diagnosis not present

## 2017-04-05 DIAGNOSIS — Z5112 Encounter for antineoplastic immunotherapy: Secondary | ICD-10-CM | POA: Diagnosis present

## 2017-04-05 DIAGNOSIS — Z7189 Other specified counseling: Secondary | ICD-10-CM

## 2017-04-05 LAB — CBC WITH DIFFERENTIAL/PLATELET
BASO%: 0.8 % (ref 0.0–2.0)
BASOS ABS: 0 10*3/uL (ref 0.0–0.1)
EOS%: 1.3 % (ref 0.0–7.0)
Eosinophils Absolute: 0 10*3/uL (ref 0.0–0.5)
HCT: 39.7 % (ref 34.8–46.6)
HEMOGLOBIN: 13.1 g/dL (ref 11.6–15.9)
LYMPH%: 18.5 % (ref 14.0–49.7)
MCH: 32.6 pg (ref 25.1–34.0)
MCHC: 33.1 g/dL (ref 31.5–36.0)
MCV: 98.5 fL (ref 79.5–101.0)
MONO#: 0.2 10*3/uL (ref 0.1–0.9)
MONO%: 7.6 % (ref 0.0–14.0)
NEUT#: 2.2 10*3/uL (ref 1.5–6.5)
NEUT%: 71.8 % (ref 38.4–76.8)
Platelets: 117 10*3/uL — ABNORMAL LOW (ref 145–400)
RBC: 4.03 10*6/uL (ref 3.70–5.45)
RDW: 15.2 % — AB (ref 11.2–14.5)
WBC: 3.1 10*3/uL — ABNORMAL LOW (ref 3.9–10.3)
lymph#: 0.6 10*3/uL — ABNORMAL LOW (ref 0.9–3.3)

## 2017-04-05 LAB — LACTATE DEHYDROGENASE: LDH: 203 U/L (ref 125–245)

## 2017-04-05 LAB — COMPREHENSIVE METABOLIC PANEL
ALT: 33 U/L (ref 0–55)
AST: 49 U/L — ABNORMAL HIGH (ref 5–34)
Albumin: 3.7 g/dL (ref 3.5–5.0)
Alkaline Phosphatase: 104 U/L (ref 40–150)
Anion Gap: 10 mEq/L (ref 3–11)
BUN: 17.2 mg/dL (ref 7.0–26.0)
CHLORIDE: 105 meq/L (ref 98–109)
CO2: 27 meq/L (ref 22–29)
Calcium: 9.8 mg/dL (ref 8.4–10.4)
Creatinine: 0.8 mg/dL (ref 0.6–1.1)
EGFR: 85 mL/min/{1.73_m2} — ABNORMAL LOW (ref >90)
GLUCOSE: 94 mg/dL (ref 70–140)
POTASSIUM: 4.1 meq/L (ref 3.5–5.1)
SODIUM: 142 meq/L (ref 136–145)
Total Bilirubin: 0.61 mg/dL (ref 0.20–1.20)
Total Protein: 6 g/dL — ABNORMAL LOW (ref 6.4–8.3)

## 2017-04-05 LAB — URIC ACID: Uric Acid, Serum: 5.3 mg/dl (ref 2.6–7.4)

## 2017-04-05 MED ORDER — SODIUM CHLORIDE 0.9 % IV SOLN
20.0000 mg | Freq: Once | INTRAVENOUS | Status: AC
  Administered 2017-04-05: 20 mg via INTRAVENOUS
  Filled 2017-04-05: qty 2

## 2017-04-05 MED ORDER — SODIUM CHLORIDE 0.9 % IV SOLN
1000.0000 mg | Freq: Once | INTRAVENOUS | Status: AC
  Administered 2017-04-05: 1000 mg via INTRAVENOUS
  Filled 2017-04-05: qty 40

## 2017-04-05 MED ORDER — ACETAMINOPHEN 325 MG PO TABS
ORAL_TABLET | ORAL | Status: AC
  Filled 2017-04-05: qty 2

## 2017-04-05 MED ORDER — DIPHENHYDRAMINE HCL 50 MG/ML IJ SOLN
INTRAMUSCULAR | Status: AC
  Filled 2017-04-05: qty 1

## 2017-04-05 MED ORDER — ACETAMINOPHEN 325 MG PO TABS
650.0000 mg | ORAL_TABLET | Freq: Once | ORAL | Status: AC
  Administered 2017-04-05: 650 mg via ORAL

## 2017-04-05 MED ORDER — DIPHENHYDRAMINE HCL 50 MG/ML IJ SOLN
50.0000 mg | Freq: Once | INTRAMUSCULAR | Status: AC
  Administered 2017-04-05: 50 mg via INTRAVENOUS

## 2017-04-05 MED ORDER — SODIUM CHLORIDE 0.9 % IV SOLN
Freq: Once | INTRAVENOUS | Status: AC
Start: 2017-04-05 — End: 2017-04-05
  Administered 2017-04-05: 10:00:00 via INTRAVENOUS

## 2017-04-05 NOTE — Progress Notes (Signed)
[facility] OFFICE PROGRESS NOTE  Patient Care Team: Kelton Pillar, MD as PCP - General (Family Medicine)  SUMMARY OF ONCOLOGIC HISTORY:   CLL (chronic lymphocytic leukemia) ([place])   11/29/2001 Pathology Results    [phone] Left neck biopsy: LEFT NECK LYMPH NODE, EXCISION: SMALL LYMPHOCYTIC LYMPHOMA/CHRONIC LYMPHOCYTIC LEUKEMIA, SEE COMMENT  COMMENT Sections show effacement of the lymph node architecture by uniform small lymphoid cells displaying chromatin clumping and small to inconspicuous nucleoli. This is associated with numerous proliferation centers (pseudofollicular centers). There is no evidence of high grade transformation. Flow cytometric analysis showed a monoclonal, lambda restricted B cell population expressing pan B cell antigens including CD20 and CD23 with coexpression of CD5 and CD20. 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      01/04/2002 Bone Marrow Biopsy    BONE MARROW, TOUCH IMPRINTS AND CORE BIOPSY: INVOLVEMENT BY SMALL LYMPHOCYTIC LYMPHOMA/CHRONIC LYMPHOCYTIC LEUKEMIA      01/06/2004 Miscellaneous    She was treated for the CLL with Rituxan in 2005/ 2006 and again Jan 2008 thru Feb 2009,with good response, butheld then due to complaints of transient short term memory loss after each RItuxan treatment      10/31/2016 PET scan    Diffuse cervical, axillary, retroperitoneal, and pelvic adenopathy is noted which exhibits very mild increased FDG uptake. I suspect the mild level of increased FDG uptake reflects the inherent hypometabolic state of the tumor as opposed to lack of viable tumor.  2. Brown fat within bilateral supraclavicular and paraspinal fat.      11/12/2016 Imaging    Ct scan showed significant progression of bulky confluent retroperitoneal and bilateral pelvic lymphadenopathy. Mild progression of thoracic lymphadenopathy involving the bilateral axillary, mediastinal and bilateral  hilar chains. 2. New extensive fine perilymphatic distribution nodularity and interlobular septal thickening throughout both lungs, most consistent with lymphangitic tumor, as can be seen in lymphoproliferative disorders. 3. Normal size spleen. 4. Additional findings include aortic atherosclerosis, stable 2.0 cm inferior thyroid isthmus nodule mild sigmoid diverticulosis, mild superior L1 vertebral compression fracture of indeterminate chronicity (new since 03/06/2013), and increased size of moderate midline high ventral abdominal wall hernia containing fat and fluid.      12/31/2016 Pathology Results    Lymph node for lymphoma, left supraclavicular - SMALL LYMPHOCYTIC LYMPHOMA. - SEE ONCOLOGY TABLE. Microscopic Comment LYMPHOMA Histologic type: Non-Hodgkin lymphoma, small lymphocytic type. Grade (if applicable): Low grade. Flow cytometry: Monoclonal, lambda restricted B-cell population expressing pan B-cell antigens including CD20 associated with dim CD5 expression. No CD10 expression is identified ([id number]). Immunohistochemical stains: CD10, CD20, CD3, CD43, CD5, CD79a, and cyclin D1 with appropriate controls. Touch preps/imprints: Predominance of small lymphoid cells with high nuclear cytoplasmic ratio, coarse chromatin and small to inconspicuous nucleoli admixed with larger lymphoid cells to a much lesser extent. Comments: The sections show lymph nodal tissue displaying effacement of the architecture by a lymphoproliferative process characterized by predominance of small lymphoid cells with high nuclear cytoplasmic ratio, round to slightly irregular nuclei, coarse chromatin and small to inconspicuous nucleoli. This is associated with abundant variably sized proliferation centers (pseudofollicular centers) imparting a vaguely nodular pattern. Flow cytometric analysis shows a monoclonal, lambda restricted B-cell population expressing pan B-cell antigens associated with dim CD5 expression. In addition,  immunohistochemical stains were performed and show that the lymphoid cells are predominantly composed of B cells as seen with CD20 and CD79a associated with co-expression with CD43 and weak co-expression with CD5. No significant CD10 or cyclin D1 positivity  is identified. There is admixed T-cell population to a lesser extent as primarily seen with CD3. The findings are consistent with involvement by small lymphocytic lymphoma/chronic lymphocytic leukemia. Clinical correlation is recommended. (BNS:ecj 01/04/2017)      12/31/2016 Surgery    She had excisional biopsy left supraclavicular lymph nodes      02/14/2017 -  Chemotherapy    The patient had treatment with Gazyva. She has delay in starting treatment due to inability to get insurance approval for ibrutinib.      02/21/2017 Adverse Reaction    Treatment was delayed by 1 week due to pancytopenia       INTERVAL HISTORY: Please see below for problem oriented charting. She is seen today prior to cycle 2 of treatment. She has been feeling well. She is gaining weight. She only complained of some mild allergy symptoms. Denies new lymphadenopathy.  No recent infection. The patient denies any recent signs or symptoms of bleeding such as spontaneous epistaxis, hematuria or hematochezia.   REVIEW OF SYSTEMS:   Constitutional: Denies fevers, chills or abnormal weight loss Eyes: Denies blurriness of vision Ears, nose, mouth, throat, and face: Denies mucositis or sore throat Respiratory: Denies cough, dyspnea or wheezes Cardiovascular: Denies palpitation, chest discomfort or lower extremity swelling Gastrointestinal:  Denies nausea, heartburn or change in bowel habits Skin: Denies abnormal skin rashes Lymphatics: Denies new lymphadenopathy or easy bruising Neurological:Denies numbness, tingling or new weaknesses Behavioral/Psych: Mood is stable, no new changes  All other systems were reviewed with the patient and are negative.  I have  reviewed the past medical history, past surgical history, social history and family history with the patient and they are unchanged from previous note.  ALLERGIES:  is allergic to gluten meal; influenza virus vacc split pf; nutrasweet aspartame [aspartame]; salicylates cross reactors; and sulfa antibiotics.  MEDICATIONS:  Current Outpatient Prescriptions  Medication Sig Dispense Refill  . allopurinol (ZYLOPRIM) 300 MG tablet Take 1 tablet (300 mg total) by mouth daily. 30 tablet 1  . Calcium Carbonate-Vitamin D (CALCIUM 600+D) 600-200 MG-UNIT TABS Take 1 tablet by mouth 2 (two) times daily.    . Multiple Vitamin (MULTIVITAMIN WITH MINERALS) TABS tablet Take 1 tablet by mouth daily.    . Olopatadine HCl (PAZEO OP) Apply to eye.    Marland Kitchen PAZEO 0.7 % SOLN Place 1 drop into both eyes daily.  4  . prednisoLONE acetate (PRED FORTE) 1 % ophthalmic suspension Place 1 drop into the right eye 2 (two) times daily.  4  . predniSONE (DELTASONE) 20 MG tablet Take 1 tablet (20 mg total) by mouth daily with breakfast. 30 tablet 1  . HYDROcodone-acetaminophen (NORCO/VICODIN) 5-325 MG tablet Take 1 tablet by mouth every 6 (six) hours as needed for moderate pain. (Patient not taking: Reported on 01/24/2017) 30 tablet 0  . ondansetron (ZOFRAN ODT) 4 MG disintegrating tablet '4mg'$  ODT q6 hours prn nausea/vomit (Patient not taking: Reported on 02/28/2017) 8 tablet 0   No current facility-administered medications for this visit.     PHYSICAL EXAMINATION: ECOG PERFORMANCE STATUS: 0 - Asymptomatic  Vitals:   04/05/17 0850  BP: (!) 136/55  Pulse: 81  Resp: 17  Temp: 98.2 F (36.8 C)   Filed Weights   04/05/17 0850  Weight: 118 lb 8 oz (53.8 kg)    GENERAL:alert, no distress and comfortable SKIN: skin color, texture, turgor are normal, no rashes or significant lesions EYES: normal, Conjunctiva are pink and non-injected, sclera clear OROPHARYNX:no exudate, no erythema and lips, buccal  mucosa, and tongue normal   NECK: supple, thyroid normal size, non-tender, without nodularity LYMPH:  no palpable lymphadenopathy in the cervical, axillary or inguinal LUNGS: clear to auscultation and percussion with normal breathing effort HEART: regular rate & rhythm and no murmurs with mild lower extremity edema ABDOMEN:abdomen soft, non-tender and normal bowel sounds Musculoskeletal:no cyanosis of digits and no clubbing  NEURO: alert & oriented x 3 with fluent speech, no focal motor/sensory deficits  LABORATORY DATA:  I have reviewed the data as listed    Component Value Date/Time   NA 142 04/05/2017 0835   K 4.1 04/05/2017 0835   CL 99 (L) 02/19/2017 0951   CL 106 03/21/2013 1302   CO2 27 04/05/2017 0835   GLUCOSE 94 04/05/2017 0835   GLUCOSE 87 03/21/2013 1302   BUN 17.2 04/05/2017 0835   CREATININE 0.8 04/05/2017 0835   CALCIUM 9.8 04/05/2017 0835   PROT 6.0 (L) 04/05/2017 0835   ALBUMIN 3.7 04/05/2017 0835   AST 49 (H) 04/05/2017 0835   ALT 33 04/05/2017 0835   ALKPHOS 104 04/05/2017 0835   BILITOT 0.61 04/05/2017 0835   GFRNONAA >60 02/19/2017 0951   GFRAA >60 02/19/2017 0951    No results found for: SPEP, UPEP  Lab Results  Component Value Date   WBC 3.1 (L) 04/05/2017   NEUTROABS 2.2 04/05/2017   HGB 13.1 04/05/2017   HCT 39.7 04/05/2017   MCV 98.5 04/05/2017   PLT 117 (L) 04/05/2017      Chemistry      Component Value Date/Time   NA 142 04/05/2017 0835   K 4.1 04/05/2017 0835   CL 99 (L) 02/19/2017 0951   CL 106 03/21/2013 1302   CO2 27 04/05/2017 0835   BUN 17.2 04/05/2017 0835   CREATININE 0.8 04/05/2017 0835      Component Value Date/Time   CALCIUM 9.8 04/05/2017 0835   ALKPHOS 104 04/05/2017 0835   AST 49 (H) 04/05/2017 0835   ALT 33 04/05/2017 0835   BILITOT 0.61 04/05/2017 0835       ASSESSMENT & PLAN:  CLL (chronic lymphocytic leukemia) ([place]) The patient has profound positive clinical response to treatment. She has resolution of anemia and  thrombocytopenia. She is still leukopenic. She is not symptomatic. I would proceed with treatment today as scheduled. We discussed neutropenic precaution She will continue prophylactic antimicrobial therapy with acyclovir I plan to repeat imaging study after 3 cycles of chemotherapy I will see her back before cycle 3 of treatment  Leukopenia due to antineoplastic chemotherapy [facility]) This is likely due to recent treatment. The patient denies recent history of fevers, cough, chills, diarrhea or dysuria. She is asymptomatic from the leukopenia. I will observe for now.  I will continue the chemotherapy at current dose without dosage adjustment.  If the leukopenia gets progressive worse in the future, I might have to delay her treatment or adjust the chemotherapy dose.    Thrombocytopenia ([place]) This is likely due to recent treatment. The patient denies recent history of bleeding such as epistaxis, hematuria or hematochezia. She is asymptomatic from the low platelet count. I will observe for now.  she does not require transfusion now. I will continue the chemotherapy at current dose without dosage adjustment.  If the thrombocytopenia gets progressive worse in the future, I might have to delay her treatment or adjust the chemotherapy dose.    Elevated liver enzymes Cause unknown but could be related to treatment.  Monitor closely   No orders of  the defined types were placed in this encounter.  All questions were answered. The patient knows to call the clinic with any problems, questions or concerns. No barriers to learning was detected. I spent 15 minutes counseling the patient face to face. The total time spent in the appointment was 20 minutes and more than 50% was on counseling and review of test results     Heath Lark, MD 04/05/2017 9:38 AM

## 2017-04-05 NOTE — Assessment & Plan Note (Signed)
Cause unknown but could be related to treatment.  Monitor closely

## 2017-04-05 NOTE — Assessment & Plan Note (Signed)
The patient has profound positive clinical response to treatment. She has resolution of anemia and thrombocytopenia. She is still leukopenic. She is not symptomatic. I would proceed with treatment today as scheduled. We discussed neutropenic precaution She will continue prophylactic antimicrobial therapy with acyclovir I plan to repeat imaging study after 3 cycles of chemotherapy I will see her back before cycle 3 of treatment

## 2017-04-05 NOTE — Patient Instructions (Signed)
[facility] Discharge Instructions for Patients Receiving Chemotherapy  Today you received the following chemotherapy agents: Gazyva   To help prevent nausea and vomiting after your treatment, we encourage you to take your nausea medication as directed.    If you develop nausea and vomiting that is not controlled by your nausea medication, call the clinic.   BELOW ARE SYMPTOMS THAT SHOULD BE REPORTED IMMEDIATELY:  *FEVER GREATER THAN 100.5 F  *CHILLS WITH OR WITHOUT FEVER  NAUSEA AND VOMITING THAT IS NOT CONTROLLED WITH YOUR NAUSEA MEDICATION  *UNUSUAL SHORTNESS OF BREATH  *UNUSUAL BRUISING OR BLEEDING  TENDERNESS IN MOUTH AND THROAT WITH OR WITHOUT PRESENCE OF ULCERS  *URINARY PROBLEMS  *BOWEL PROBLEMS  UNUSUAL RASH Items with * indicate a potential emergency and should be followed up as soon as possible.  Feel free to call the clinic you have any questions or concerns. The clinic phone number is [phone].  Please show the CHEMO ALERT CARD at check-in to the Emergency Department and triage nurse.   

## 2017-04-05 NOTE — Assessment & Plan Note (Signed)
This is likely due to recent treatment. The patient denies recent history of bleeding such as epistaxis, hematuria or hematochezia. She is asymptomatic from the low platelet count. I will observe for now.  she does not require transfusion now. I will continue the chemotherapy at current dose without dosage adjustment.  If the thrombocytopenia gets progressive worse in the future, I might have to delay her treatment or adjust the chemotherapy dose.   

## 2017-04-05 NOTE — Assessment & Plan Note (Signed)
This is likely due to recent treatment. The patient denies recent history of fevers, cough, chills, diarrhea or dysuria. She is asymptomatic from the leukopenia. I will observe for now.  I will continue the chemotherapy at current dose without dosage adjustment.  If the leukopenia gets progressive worse in the future, I might have to delay her treatment or adjust the chemotherapy dose.   

## 2017-04-05 NOTE — Telephone Encounter (Signed)
Scheduled appts per 4/10 los.  Patient to receive new schedule in the treatment area.

## 2017-05-03 ENCOUNTER — Ambulatory Visit (HOSPITAL_BASED_OUTPATIENT_CLINIC_OR_DEPARTMENT_OTHER): Payer: Medicare Other

## 2017-05-03 ENCOUNTER — Encounter: Payer: Self-pay | Admitting: Hematology and Oncology

## 2017-05-03 ENCOUNTER — Other Ambulatory Visit (HOSPITAL_BASED_OUTPATIENT_CLINIC_OR_DEPARTMENT_OTHER): Payer: Medicare Other

## 2017-05-03 ENCOUNTER — Ambulatory Visit (HOSPITAL_BASED_OUTPATIENT_CLINIC_OR_DEPARTMENT_OTHER): Payer: Medicare Other | Admitting: Hematology and Oncology

## 2017-05-03 ENCOUNTER — Telehealth: Payer: Self-pay | Admitting: Hematology and Oncology

## 2017-05-03 VITALS — BP 105/49 | HR 82 | Temp 98.3°F | Resp 18

## 2017-05-03 VITALS — BP 121/57 | HR 85 | Temp 98.4°F | Resp 18 | Ht 61.0 in | Wt 118.2 lb

## 2017-05-03 DIAGNOSIS — C9110 Chronic lymphocytic leukemia of B-cell type not having achieved remission: Secondary | ICD-10-CM

## 2017-05-03 DIAGNOSIS — Z5112 Encounter for antineoplastic immunotherapy: Secondary | ICD-10-CM | POA: Diagnosis present

## 2017-05-03 DIAGNOSIS — R748 Abnormal levels of other serum enzymes: Secondary | ICD-10-CM

## 2017-05-03 DIAGNOSIS — T451X5A Adverse effect of antineoplastic and immunosuppressive drugs, initial encounter: Secondary | ICD-10-CM

## 2017-05-03 DIAGNOSIS — D696 Thrombocytopenia, unspecified: Secondary | ICD-10-CM

## 2017-05-03 DIAGNOSIS — D701 Agranulocytosis secondary to cancer chemotherapy: Secondary | ICD-10-CM

## 2017-05-03 DIAGNOSIS — Z7189 Other specified counseling: Secondary | ICD-10-CM

## 2017-05-03 LAB — COMPREHENSIVE METABOLIC PANEL
ALBUMIN: 3.2 g/dL — AB (ref 3.5–5.0)
ALK PHOS: 120 U/L (ref 40–150)
ALT: 40 U/L (ref 0–55)
AST: 61 U/L — ABNORMAL HIGH (ref 5–34)
Anion Gap: 8 mEq/L (ref 3–11)
BUN: 11.1 mg/dL (ref 7.0–26.0)
CO2: 26 meq/L (ref 22–29)
Calcium: 9 mg/dL (ref 8.4–10.4)
Chloride: 106 mEq/L (ref 98–109)
Creatinine: 0.7 mg/dL (ref 0.6–1.1)
GLUCOSE: 99 mg/dL (ref 70–140)
POTASSIUM: 3.9 meq/L (ref 3.5–5.1)
Sodium: 140 mEq/L (ref 136–145)
Total Bilirubin: 0.59 mg/dL (ref 0.20–1.20)
Total Protein: 5.6 g/dL — ABNORMAL LOW (ref 6.4–8.3)

## 2017-05-03 LAB — CBC WITH DIFFERENTIAL/PLATELET
BASO%: 1 % (ref 0.0–2.0)
BASOS ABS: 0 10*3/uL (ref 0.0–0.1)
EOS ABS: 0.1 10*3/uL (ref 0.0–0.5)
EOS%: 2.9 % (ref 0.0–7.0)
HCT: 38.7 % (ref 34.8–46.6)
HGB: 12.8 g/dL (ref 11.6–15.9)
LYMPH%: 36.3 % (ref 14.0–49.7)
MCH: 31.7 pg (ref 25.1–34.0)
MCHC: 33.2 g/dL (ref 31.5–36.0)
MCV: 95.6 fL (ref 79.5–101.0)
MONO#: 0.3 10*3/uL (ref 0.1–0.9)
MONO%: 10.5 % (ref 0.0–14.0)
NEUT%: 49.3 % (ref 38.4–76.8)
NEUTROS ABS: 1.2 10*3/uL — AB (ref 1.5–6.5)
Platelets: 140 10*3/uL — ABNORMAL LOW (ref 145–400)
RBC: 4.04 10*6/uL (ref 3.70–5.45)
RDW: 14.1 % (ref 11.2–14.5)
WBC: 2.5 10*3/uL — AB (ref 3.9–10.3)
lymph#: 0.9 10*3/uL (ref 0.9–3.3)

## 2017-05-03 LAB — LACTATE DEHYDROGENASE: LDH: 206 U/L (ref 125–245)

## 2017-05-03 LAB — URIC ACID: URIC ACID, SERUM: 5.4 mg/dL (ref 2.6–7.4)

## 2017-05-03 MED ORDER — SODIUM CHLORIDE 0.9 % IV SOLN
20.0000 mg | Freq: Once | INTRAVENOUS | Status: AC
  Administered 2017-05-03: 20 mg via INTRAVENOUS
  Filled 2017-05-03: qty 2

## 2017-05-03 MED ORDER — DIPHENHYDRAMINE HCL 50 MG/ML IJ SOLN
INTRAMUSCULAR | Status: AC
  Filled 2017-05-03: qty 1

## 2017-05-03 MED ORDER — DIPHENHYDRAMINE HCL 50 MG/ML IJ SOLN
50.0000 mg | Freq: Once | INTRAMUSCULAR | Status: AC
  Administered 2017-05-03: 50 mg via INTRAVENOUS

## 2017-05-03 MED ORDER — OBINUTUZUMAB CHEMO INJECTION 1000 MG/40ML
1000.0000 mg | Freq: Once | INTRAVENOUS | Status: AC
  Administered 2017-05-03: 1000 mg via INTRAVENOUS
  Filled 2017-05-03: qty 40

## 2017-05-03 MED ORDER — ACETAMINOPHEN 325 MG PO TABS
650.0000 mg | ORAL_TABLET | Freq: Once | ORAL | Status: AC
  Administered 2017-05-03: 650 mg via ORAL

## 2017-05-03 MED ORDER — ACETAMINOPHEN 325 MG PO TABS
ORAL_TABLET | ORAL | Status: AC
Start: 2017-05-03 — End: 2017-05-03
  Filled 2017-05-03: qty 2

## 2017-05-03 MED ORDER — SODIUM CHLORIDE 0.9 % IV SOLN
Freq: Once | INTRAVENOUS | Status: AC
  Administered 2017-05-03: 10:00:00 via INTRAVENOUS

## 2017-05-03 NOTE — Assessment & Plan Note (Signed)
This is likely due to recent treatment. The patient denies recent history of fevers, cough, chills, diarrhea or dysuria. She is asymptomatic from the leukopenia. I will observe for now.  I will continue the chemotherapy at current dose without dosage adjustment.  If the leukopenia gets progressive worse in the future, I might have to delay her treatment or adjust the chemotherapy dose. We discussed neutropenic precaution 

## 2017-05-03 NOTE — Assessment & Plan Note (Signed)
Cause unknown but could be related to treatment.  Monitor closely

## 2017-05-03 NOTE — Progress Notes (Signed)
[place] OFFICE PROGRESS NOTE  Patient Care Team: Kelton Pillar, MD as PCP - General (Family Medicine)  SUMMARY OF ONCOLOGIC HISTORY:   CLL (chronic lymphocytic leukemia) ([place])   11/29/2001 Pathology Results    [phone] Left neck biopsy: LEFT NECK LYMPH NODE, EXCISION: SMALL LYMPHOCYTIC LYMPHOMA/CHRONIC LYMPHOCYTIC LEUKEMIA, SEE COMMENT  COMMENT Sections show effacement of the lymph node architecture by uniform small lymphoid cells displaying chromatin clumping and small to inconspicuous nucleoli. This is associated with numerous proliferation centers (pseudofollicular centers). There is no evidence of high grade transformation. Flow cytometric analysis showed a monoclonal, lambda restricted B cell population expressing pan B cell antigens including CD20 and CD23 with coexpression of CD5 and CD20. 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      01/04/2002 Bone Marrow Biopsy    BONE MARROW, TOUCH IMPRINTS AND CORE BIOPSY: INVOLVEMENT BY SMALL LYMPHOCYTIC LYMPHOMA/CHRONIC LYMPHOCYTIC LEUKEMIA      01/06/2004 Miscellaneous    She was treated for the CLL with Rituxan in 2005/ 2006 and again Jan 2008 thru Feb 2009,with good response, butheld then due to complaints of transient short term memory loss after each RItuxan treatment      10/31/2016 PET scan    Diffuse cervical, axillary, retroperitoneal, and pelvic adenopathy is noted which exhibits very mild increased FDG uptake. I suspect the mild level of increased FDG uptake reflects the inherent hypometabolic state of the tumor as opposed to lack of viable tumor.  2. Brown fat within bilateral supraclavicular and paraspinal fat.      11/12/2016 Imaging    Ct scan showed significant progression of bulky confluent retroperitoneal and bilateral pelvic lymphadenopathy. Mild progression of thoracic lymphadenopathy involving the bilateral axillary, mediastinal and bilateral  hilar chains. 2. New extensive fine perilymphatic distribution nodularity and interlobular septal thickening throughout both lungs, most consistent with lymphangitic tumor, as can be seen in lymphoproliferative disorders. 3. Normal size spleen. 4. Additional findings include aortic atherosclerosis, stable 2.0 cm inferior thyroid isthmus nodule mild sigmoid diverticulosis, mild superior L1 vertebral compression fracture of indeterminate chronicity (new since 03/06/2013), and increased size of moderate midline high ventral abdominal wall hernia containing fat and fluid.      12/31/2016 Pathology Results    Lymph node for lymphoma, left supraclavicular - SMALL LYMPHOCYTIC LYMPHOMA. - SEE ONCOLOGY TABLE. Microscopic Comment LYMPHOMA Histologic type: Non-Hodgkin lymphoma, small lymphocytic type. Grade (if applicable): Low grade. Flow cytometry: Monoclonal, lambda restricted B-cell population expressing pan B-cell antigens including CD20 associated with dim CD5 expression. No CD10 expression is identified ([id number]). Immunohistochemical stains: CD10, CD20, CD3, CD43, CD5, CD79a, and cyclin D1 with appropriate controls. Touch preps/imprints: Predominance of small lymphoid cells with high nuclear cytoplasmic ratio, coarse chromatin and small to inconspicuous nucleoli admixed with larger lymphoid cells to a much lesser extent. Comments: The sections show lymph nodal tissue displaying effacement of the architecture by a lymphoproliferative process characterized by predominance of small lymphoid cells with high nuclear cytoplasmic ratio, round to slightly irregular nuclei, coarse chromatin and small to inconspicuous nucleoli. This is associated with abundant variably sized proliferation centers (pseudofollicular centers) imparting a vaguely nodular pattern. Flow cytometric analysis shows a monoclonal, lambda restricted B-cell population expressing pan B-cell antigens associated with dim CD5 expression. In addition,  immunohistochemical stains were performed and show that the lymphoid cells are predominantly composed of B cells as seen with CD20 and CD79a associated with co-expression with CD43 and weak co-expression with CD5. No significant CD10 or cyclin D1 positivity  is identified. There is admixed T-cell population to a lesser extent as primarily seen with CD3. The findings are consistent with involvement by small lymphocytic lymphoma/chronic lymphocytic leukemia. Clinical correlation is recommended. (BNS:ecj 01/04/2017)      12/31/2016 Surgery    She had excisional biopsy left supraclavicular lymph nodes      02/14/2017 -  Chemotherapy    The patient had treatment with Gazyva. She has delay in starting treatment due to inability to get insurance approval for ibrutinib.      02/21/2017 Adverse Reaction    Treatment was delayed by 1 week due to pancytopenia       INTERVAL HISTORY: Please see below for problem oriented charting. She returns with her husband for treatment She stopped taking prednisone 2 weeks ago Appetite is stable No recent new lymphadenopathy Denies recent infection, fever or chills. The patient denies any recent signs or symptoms of bleeding such as spontaneous epistaxis, hematuria or hematochezia.   REVIEW OF SYSTEMS:   Constitutional: Denies fevers, chills or abnormal weight loss Eyes: Denies blurriness of vision Ears, nose, mouth, throat, and face: Denies mucositis or sore throat Respiratory: Denies cough, dyspnea or wheezes Cardiovascular: Denies palpitation, chest discomfort or lower extremity swelling Gastrointestinal:  Denies nausea, heartburn or change in bowel habits Skin: Denies abnormal skin rashes Lymphatics: Denies new lymphadenopathy or easy bruising Neurological:Denies numbness, tingling or new weaknesses Behavioral/Psych: Mood is stable, no new changes  All other systems were reviewed with the patient and are negative.  I have reviewed the past medical  history, past surgical history, social history and family history with the patient and they are unchanged from previous note.  ALLERGIES:  is allergic to gluten meal; influenza virus vacc split pf; nutrasweet aspartame [aspartame]; salicylates cross reactors; and sulfa antibiotics.  MEDICATIONS:  Current Outpatient Prescriptions  Medication Sig Dispense Refill  . acyclovir (ZOVIRAX) 200 MG capsule Take 200 mg by mouth daily. Patient not sure of dosage amount    . Calcium Carbonate-Vitamin D (CALCIUM 600+D) 600-200 MG-UNIT TABS Take 1 tablet by mouth 2 (two) times daily.    . Multiple Vitamin (MULTIVITAMIN WITH MINERALS) TABS tablet Take 1 tablet by mouth daily.    Marland Kitchen PAZEO 0.7 % SOLN Place 1 drop into both eyes daily.  4  . prednisoLONE acetate (PRED FORTE) 1 % ophthalmic suspension Place 1 drop into the right eye 2 (two) times daily.  4  . ondansetron (ZOFRAN ODT) 4 MG disintegrating tablet '4mg'$  ODT q6 hours prn nausea/vomit (Patient not taking: Reported on 02/28/2017) 8 tablet 0  . predniSONE (DELTASONE) 20 MG tablet Take 1 tablet (20 mg total) by mouth daily with breakfast. (Patient not taking: Reported on 05/03/2017) 30 tablet 1   No current facility-administered medications for this visit.     PHYSICAL EXAMINATION: ECOG PERFORMANCE STATUS: 0 - Asymptomatic  Vitals:   05/03/17 0826  BP: (!) 121/57  Pulse: 85  Resp: 18  Temp: 98.4 F (36.9 C)   Filed Weights   05/03/17 0826  Weight: 118 lb 3.2 oz (53.6 kg)    GENERAL:alert, no distress and comfortable SKIN: skin color, texture, turgor are normal, no rashes or significant lesions EYES: normal, Conjunctiva are pink and non-injected, sclera clear OROPHARYNX:no exudate, no erythema and lips, buccal mucosa, and tongue normal  NECK: supple, thyroid normal size, non-tender, without nodularity LYMPH:  no palpable lymphadenopathy in the cervical, axillary or inguinal LUNGS: clear to auscultation and percussion with normal breathing  effort HEART: regular rate & rhythm  and no murmurs and no lower extremity edema ABDOMEN:abdomen soft, non-tender and normal bowel sounds Musculoskeletal:no cyanosis of digits and no clubbing  NEURO: alert & oriented x 3 with fluent speech, no focal motor/sensory deficits  LABORATORY DATA:  I have reviewed the data as listed    Component Value Date/Time   NA 140 05/03/2017 0757   K 3.9 05/03/2017 0757   CL 99 (L) 02/19/2017 0951   CL 106 03/21/2013 1302   CO2 26 05/03/2017 0757   GLUCOSE 99 05/03/2017 0757   GLUCOSE 87 03/21/2013 1302   BUN 11.1 05/03/2017 0757   CREATININE 0.7 05/03/2017 0757   CALCIUM 9.0 05/03/2017 0757   PROT 5.6 (L) 05/03/2017 0757   ALBUMIN 3.2 (L) 05/03/2017 0757   AST 61 (H) 05/03/2017 0757   ALT 40 05/03/2017 0757   ALKPHOS 120 05/03/2017 0757   BILITOT 0.59 05/03/2017 0757   GFRNONAA >60 02/19/2017 0951   GFRAA >60 02/19/2017 0951    No results found for: SPEP, UPEP  Lab Results  Component Value Date   WBC 2.5 (L) 05/03/2017   NEUTROABS 1.2 (L) 05/03/2017   HGB 12.8 05/03/2017   HCT 38.7 05/03/2017   MCV 95.6 05/03/2017   PLT 140 (L) 05/03/2017      Chemistry      Component Value Date/Time   NA 140 05/03/2017 0757   K 3.9 05/03/2017 0757   CL 99 (L) 02/19/2017 0951   CL 106 03/21/2013 1302   CO2 26 05/03/2017 0757   BUN 11.1 05/03/2017 0757   CREATININE 0.7 05/03/2017 0757      Component Value Date/Time   CALCIUM 9.0 05/03/2017 0757   ALKPHOS 120 05/03/2017 0757   AST 61 (H) 05/03/2017 0757   ALT 40 05/03/2017 0757   BILITOT 0.59 05/03/2017 0757      ASSESSMENT & PLAN:  CLL (chronic lymphocytic leukemia) ([facility]) The patient has profound positive clinical response to treatment. She has resolution of anemia and thrombocytopenia. She is still leukopenic. She is not symptomatic. I would proceed with treatment today as scheduled. We discussed neutropenic precaution She will continue prophylactic antimicrobial therapy with  acyclovir I plan to repeat imaging study after this cycle of chemotherapy  Leukopenia due to antineoplastic chemotherapy [facility]) This is likely due to recent treatment. The patient denies recent history of fevers, cough, chills, diarrhea or dysuria. She is asymptomatic from the leukopenia. I will observe for now.  I will continue the chemotherapy at current dose without dosage adjustment.  If the leukopenia gets progressive worse in the future, I might have to delay her treatment or adjust the chemotherapy dose. We discussed neutropenic precaution   Thrombocytopenia ([facility]) This is likely due to recent treatment. The patient denies recent history of bleeding such as epistaxis, hematuria or hematochezia. She is asymptomatic from the low platelet count. I will observe for now.  she does not require transfusion now. I will continue the chemotherapy at current dose without dosage adjustment.  If the thrombocytopenia gets progressive worse in the future, I might have to delay her treatment or adjust the chemotherapy dose.    Elevated liver enzymes Cause unknown but could be related to treatment.  Monitor closely   Orders Placed This Encounter  Procedures  . NM PET Image Restag (PS) Skull Base To Thigh    Standing Status:   Future    Standing Expiration Date:   07/03/2018    Order Specific Question:   Reason for Exam (SYMPTOM  OR DIAGNOSIS  REQUIRED)    Answer:   staging lymphoma, assess response to Tx    Order Specific Question:   Preferred imaging location?    Answer:   [facility]   All questions were answered. The patient knows to call the clinic with any problems, questions or concerns. No barriers to learning was detected. I spent 15 minutes counseling the patient face to face. The total time spent in the appointment was 20 minutes and more than 50% was on counseling and review of test results     Heath Lark, MD 05/03/2017 8:46 AM

## 2017-05-03 NOTE — Telephone Encounter (Signed)
Appointments scheduled per 05/03/17 LOS. Patient given AVS report and calendars with future scheduled appointments.  °

## 2017-05-03 NOTE — Assessment & Plan Note (Signed)
This is likely due to recent treatment. The patient denies recent history of bleeding such as epistaxis, hematuria or hematochezia. She is asymptomatic from the low platelet count. I will observe for now.  she does not require transfusion now. I will continue the chemotherapy at current dose without dosage adjustment.  If the thrombocytopenia gets progressive worse in the future, I might have to delay her treatment or adjust the chemotherapy dose.   

## 2017-05-03 NOTE — Assessment & Plan Note (Signed)
The patient has profound positive clinical response to treatment. She has resolution of anemia and thrombocytopenia. She is still leukopenic. She is not symptomatic. I would proceed with treatment today as scheduled. We discussed neutropenic precaution She will continue prophylactic antimicrobial therapy with acyclovir I plan to repeat imaging study after this cycle of chemotherapy

## 2017-05-12 DIAGNOSIS — H20041 Secondary noninfectious iridocyclitis, right eye: Secondary | ICD-10-CM | POA: Diagnosis not present

## 2017-05-12 DIAGNOSIS — H401131 Primary open-angle glaucoma, bilateral, mild stage: Secondary | ICD-10-CM | POA: Diagnosis not present

## 2017-05-16 ENCOUNTER — Telehealth: Payer: Self-pay

## 2017-05-16 NOTE — Telephone Encounter (Signed)
Called with below message, scheduler will call and schedule PET scan,

## 2017-05-16 NOTE — Telephone Encounter (Signed)
-----   Message from Loletta Parish sent at 05/16/2017 11:04 AM EDT ----- Regarding: RE: PET scan not scheduled yet PET authorized.  Thanks  ----- Message ----- From: Heath Lark, MD Sent: 05/16/2017  10:40 AM To: Patton Salles, RN, Flo Shanks, RN, # Subject: PET scan not scheduled yet                     PLs make sure it gets done by 6/4

## 2017-05-30 ENCOUNTER — Other Ambulatory Visit (HOSPITAL_BASED_OUTPATIENT_CLINIC_OR_DEPARTMENT_OTHER): Payer: Medicare Other

## 2017-05-30 ENCOUNTER — Encounter (HOSPITAL_COMMUNITY)
Admission: RE | Admit: 2017-05-30 | Discharge: 2017-05-30 | Disposition: A | Discharge status: Home / Self Care | Payer: Medicare Other | Source: Ambulatory Visit | Attending: Hematology and Oncology | Admitting: Hematology and Oncology

## 2017-05-30 DIAGNOSIS — C9110 Chronic lymphocytic leukemia of B-cell type not having achieved remission: Secondary | ICD-10-CM

## 2017-05-30 DIAGNOSIS — C9190 Lymphoid leukemia, unspecified not having achieved remission: Secondary | ICD-10-CM | POA: Diagnosis not present

## 2017-05-30 LAB — CBC WITH DIFFERENTIAL/PLATELET
BASO%: 2 % (ref 0.0–2.0)
Basophils Absolute: 0 10*3/uL (ref 0.0–0.1)
EOS%: 3.5 % (ref 0.0–7.0)
Eosinophils Absolute: 0.1 10*3/uL (ref 0.0–0.5)
HEMATOCRIT: 40.9 % (ref 34.8–46.6)
HEMOGLOBIN: 13.2 g/dL (ref 11.6–15.9)
LYMPH#: 1 10*3/uL (ref 0.9–3.3)
LYMPH%: 49.3 % (ref 14.0–49.7)
MCH: 30.7 pg (ref 25.1–34.0)
MCHC: 32.3 g/dL (ref 31.5–36.0)
MCV: 95.1 fL (ref 79.5–101.0)
MONO#: 0.2 10*3/uL (ref 0.1–0.9)
MONO%: 11.9 % (ref 0.0–14.0)
NEUT#: 0.7 10*3/uL — ABNORMAL LOW (ref 1.5–6.5)
NEUT%: 33.3 % — AB (ref 38.4–76.8)
PLATELETS: 123 10*3/uL — AB (ref 145–400)
RBC: 4.3 10*6/uL (ref 3.70–5.45)
RDW: 14.1 % (ref 11.2–14.5)
WBC: 2 10*3/uL — ABNORMAL LOW (ref 3.9–10.3)

## 2017-05-30 LAB — COMPREHENSIVE METABOLIC PANEL
ALT: 26 U/L (ref 0–55)
AST: 50 U/L — AB (ref 5–34)
Albumin: 3.5 g/dL (ref 3.5–5.0)
Alkaline Phosphatase: 104 U/L (ref 40–150)
Anion Gap: 9 mEq/L (ref 3–11)
BILIRUBIN TOTAL: 0.64 mg/dL (ref 0.20–1.20)
BUN: 10.8 mg/dL (ref 7.0–26.0)
CO2: 26 mEq/L (ref 22–29)
Calcium: 9.3 mg/dL (ref 8.4–10.4)
Chloride: 109 mEq/L (ref 98–109)
Creatinine: 0.7 mg/dL (ref 0.6–1.1)
GLUCOSE: 89 mg/dL (ref 70–140)
POTASSIUM: 4.1 meq/L (ref 3.5–5.1)
Sodium: 143 mEq/L (ref 136–145)
TOTAL PROTEIN: 5.8 g/dL — AB (ref 6.4–8.3)

## 2017-05-30 LAB — GLUCOSE, CAPILLARY: GLUCOSE-CAPILLARY: 88 mg/dL (ref 65–99)

## 2017-05-30 LAB — LACTATE DEHYDROGENASE: LDH: 215 U/L (ref 125–245)

## 2017-05-30 LAB — URIC ACID: Uric Acid, Serum: 5.4 mg/dl (ref 2.6–7.4)

## 2017-05-30 MED ORDER — FLUDEOXYGLUCOSE F - 18 (FDG) INJECTION
5.8100 | Freq: Once | INTRAVENOUS | Status: AC | PRN
  Administered 2017-05-30: 5.81 via INTRAVENOUS

## 2017-05-31 ENCOUNTER — Telehealth: Payer: Self-pay | Admitting: Hematology and Oncology

## 2017-05-31 ENCOUNTER — Ambulatory Visit: Payer: Medicare Other

## 2017-05-31 ENCOUNTER — Ambulatory Visit (HOSPITAL_BASED_OUTPATIENT_CLINIC_OR_DEPARTMENT_OTHER): Payer: Medicare Other | Admitting: Hematology and Oncology

## 2017-05-31 VITALS — BP 129/70 | HR 82 | Temp 98.7°F | Resp 18 | Ht 61.0 in | Wt 119.3 lb

## 2017-05-31 DIAGNOSIS — Z7189 Other specified counseling: Secondary | ICD-10-CM

## 2017-05-31 DIAGNOSIS — D702 Other drug-induced agranulocytosis: Secondary | ICD-10-CM

## 2017-05-31 DIAGNOSIS — C9110 Chronic lymphocytic leukemia of B-cell type not having achieved remission: Secondary | ICD-10-CM

## 2017-05-31 DIAGNOSIS — R748 Abnormal levels of other serum enzymes: Secondary | ICD-10-CM | POA: Diagnosis not present

## 2017-05-31 DIAGNOSIS — D72819 Decreased white blood cell count, unspecified: Secondary | ICD-10-CM

## 2017-05-31 MED ORDER — LIDOCAINE-PRILOCAINE 2.5-2.5 % EX CREA: 1.0000 "application " | TOPICAL_CREAM | CUTANEOUS | 6 refills | Status: DC | PRN

## 2017-05-31 NOTE — Telephone Encounter (Signed)
Gave patient AVS and calender per 6/5 LOS.  

## 2017-05-31 NOTE — Progress Notes (Signed)
DISCONTINUE OFF PATHWAY REGIMEN - Lymphoma and CLL   [id number]:Obinutuzumab + Chlorambucil q28 Days:   A cycle is every 28 days:     Obinutuzumab        Dose Mod: None     Obinutuzumab        Dose Mod: None     Obinutuzumab        Dose Mod: None     Obinutuzumab        Dose Mod: None     Chlorambucil        Dose Mod: None  **Always confirm dose/schedule in your pharmacy ordering system**    REASON: Disease Progression PRIOR TREATMENT: Off Pathway: Obinutuzumab + Chlorambucil q28 Days TREATMENT RESPONSE: Progressive Disease (PD)  START OFF PATHWAY REGIMEN - Lymphoma and CLL   [id number]:Bendamustine 90 mg/m2 D1, 2 + Rituximab (IV/Subcut) D1 q28 Days:   A cycle is every 28 days:     Bendamustine      Rituximab      Rituximab and hyaluronidase human   **Always confirm dose/schedule in your pharmacy ordering system**    Patient Characteristics: Small Lymphocytic Leukemia (SLL), Third Line, 17p del (+), No Prior Ibrutinib Disease Type: Small Lymphocytic Leukemia (SLL) Disease Type: Not Applicable [place] Stage: IV Line of therapy: Third Line 17p Deletion Status: Positive Prior Ibrutinib? No Prior Ibrutinib  Intent of Therapy: Non-Curative / Palliative Intent, Discussed with Patient

## 2017-06-01 ENCOUNTER — Telehealth: Payer: Self-pay | Admitting: *Deleted

## 2017-06-01 ENCOUNTER — Encounter: Payer: Self-pay | Admitting: Hematology and Oncology

## 2017-06-01 ENCOUNTER — Other Ambulatory Visit: Payer: Self-pay | Admitting: Hematology and Oncology

## 2017-06-01 NOTE — Progress Notes (Signed)
Received approval email from [place] [employer] for Lidocaine-Prilocaine cream.  Called [facility]) to advise of the approval. She states it went through.

## 2017-06-01 NOTE — Assessment & Plan Note (Signed)
We will proceed with treatment without dose adjustment. With her baseline leukopenia, I recommend Neulasta support

## 2017-06-01 NOTE — Telephone Encounter (Signed)
-----   Message from Heath Lark, MD sent at 06/01/2017  8:38 AM EDT ----- Regarding: added Brenda Terrell,  Please help precert Neulasta in addition to chemo  Rudolph Dobler,  Can you let her know I forgot to discuss Neulasta support after chemo. We will have to add to her schedule on day 4 unless she elects for Onpro instead. Let me know

## 2017-06-01 NOTE — Assessment & Plan Note (Signed)
The patient is aware she has incurable disease and treatment is strictly palliative. We discussed importance of Advanced Directives and Living will. We discussed CODE STATUS; the patient desires to remain in full code. 

## 2017-06-01 NOTE — Progress Notes (Signed)
Received PA request for Lidocaine-Prilocaine cream.  Submitted via cover my meds.  Brenda Terrell KeyThereasa Parkin - PA Case ID: [id number] - Rx #: [id number] Need help? Call us at [phone]  Status  Sent to [employer]  DrugLidocaine-Prilocaine 2.5-2.5% EX CREA  [facility] Medicare Electronic PA Form  Original Claim [phone] PA REQUIRED-PRESCRIBER CALL [facility] [place]  Your information has been submitted to [place] Medicare Part D. [employer] will review the request and will issue a decision, typically within 1-3 days from your submission. You can check the updated outcome later by reopening this request. If [employer] has not responded in 1-3 days or if you have any questions about your ePA request, please contact [place] Medicare Part D at [phone]. If you think there may be a problem with your PA request, use our live chat feature at the bottom right.

## 2017-06-01 NOTE — Progress Notes (Signed)
[facility] OFFICE PROGRESS NOTE  Patient Care Team: Kelton Pillar, MD as PCP - General (Family Medicine)  SUMMARY OF ONCOLOGIC HISTORY:   CLL (chronic lymphocytic leukemia) ([place])   11/29/2001 Pathology Results    [phone] Left neck biopsy: LEFT NECK LYMPH NODE, EXCISION: SMALL LYMPHOCYTIC LYMPHOMA/CHRONIC LYMPHOCYTIC LEUKEMIA, SEE COMMENT  COMMENT Sections show effacement of the lymph node architecture by uniform small lymphoid cells displaying chromatin clumping and small to inconspicuous nucleoli. This is associated with numerous proliferation centers (pseudofollicular centers). There is no evidence of high grade transformation. Flow cytometric analysis showed a monoclonal, lambda restricted B cell population expressing pan B cell antigens including CD20 and CD23 with coexpression of CD5 and CD20. 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      01/04/2002 Bone Marrow Biopsy    BONE MARROW, TOUCH IMPRINTS AND CORE BIOPSY: INVOLVEMENT BY SMALL LYMPHOCYTIC LYMPHOMA/CHRONIC LYMPHOCYTIC LEUKEMIA      01/06/2004 Miscellaneous    She was treated for the CLL with Rituxan in 2005/ 2006 and again Jan 2008 thru Feb 2009,with good response, butheld then due to complaints of transient short term memory loss after each RItuxan treatment      10/31/2016 PET scan    Diffuse cervical, axillary, retroperitoneal, and pelvic adenopathy is noted which exhibits very mild increased FDG uptake. I suspect the mild level of increased FDG uptake reflects the inherent hypometabolic state of the tumor as opposed to lack of viable tumor.  2. Brown fat within bilateral supraclavicular and paraspinal fat.      11/12/2016 Imaging    Ct scan showed significant progression of bulky confluent retroperitoneal and bilateral pelvic lymphadenopathy. Mild progression of thoracic lymphadenopathy involving the bilateral axillary, mediastinal and bilateral  hilar chains. 2. New extensive fine perilymphatic distribution nodularity and interlobular septal thickening throughout both lungs, most consistent with lymphangitic tumor, as can be seen in lymphoproliferative disorders. 3. Normal size spleen. 4. Additional findings include aortic atherosclerosis, stable 2.0 cm inferior thyroid isthmus nodule mild sigmoid diverticulosis, mild superior L1 vertebral compression fracture of indeterminate chronicity (new since 03/06/2013), and increased size of moderate midline high ventral abdominal wall hernia containing fat and fluid.      12/31/2016 Pathology Results    Lymph node for lymphoma, left supraclavicular - SMALL LYMPHOCYTIC LYMPHOMA. - SEE ONCOLOGY TABLE. Microscopic Comment LYMPHOMA Histologic type: Non-Hodgkin lymphoma, small lymphocytic type. Grade (if applicable): Low grade. Flow cytometry: Monoclonal, lambda restricted B-cell population expressing pan B-cell antigens including CD20 associated with dim CD5 expression. No CD10 expression is identified ([id number]). Immunohistochemical stains: CD10, CD20, CD3, CD43, CD5, CD79a, and cyclin D1 with appropriate controls. Touch preps/imprints: Predominance of small lymphoid cells with high nuclear cytoplasmic ratio, coarse chromatin and small to inconspicuous nucleoli admixed with larger lymphoid cells to a much lesser extent. Comments: The sections show lymph nodal tissue displaying effacement of the architecture by a lymphoproliferative process characterized by predominance of small lymphoid cells with high nuclear cytoplasmic ratio, round to slightly irregular nuclei, coarse chromatin and small to inconspicuous nucleoli. This is associated with abundant variably sized proliferation centers (pseudofollicular centers) imparting a vaguely nodular pattern. Flow cytometric analysis shows a monoclonal, lambda restricted B-cell population expressing pan B-cell antigens associated with dim CD5 expression. In addition,  immunohistochemical stains were performed and show that the lymphoid cells are predominantly composed of B cells as seen with CD20 and CD79a associated with co-expression with CD43 and weak co-expression with CD5. No significant CD10 or cyclin D1 positivity  is identified. There is admixed T-cell population to a lesser extent as primarily seen with CD3. The findings are consistent with involvement by small lymphocytic lymphoma/chronic lymphocytic leukemia. Clinical correlation is recommended. (BNS:ecj 01/04/2017)      12/31/2016 Surgery    She had excisional biopsy left supraclavicular lymph nodes      02/14/2017 - 05/03/2017 Chemotherapy    The patient had treatment with Gazyva. She has delay in starting treatment due to inability to get insurance approval for ibrutinib.      02/21/2017 Adverse Reaction    Treatment was delayed by 1 week due to pancytopenia      05/30/2017 PET scan    The index left level 5 cervical lymph node measuring 2 cm on today's study previously measured 1.3 cm. The index right level 4 cervical node measures 1.6 cm is on today's study, previously 1.4 cm. The index right axillary node measures 1.3 cm on today's study. Previously 1.1 cm. The index right paratracheal node measures 1.4 cm on the current study. Previously 1.1 cm. Within the abdomen the large retroperitoneal nodal mass measures 9.2 by 4.6 cm. This is compared with 8.7 x 4.8 cm previously. Within the right external iliac lymph node chain there is a 1.3 cm lymph node on today's study. Previously this measured 1 cm. Left external iliac lymph node measures 1.2 cm on today's study. Previously 1 cm.  IMPRESSION: 1. When compared with 02/19/2017 the index lymph nodes within the neck, chest, abdomen and pelvis demonstrate mild increase in size in the interval.       INTERVAL HISTORY: Please see below for problem oriented charting. She returns to review test results. She tolerated treatment well recently without any side  effects. No new lymphadenopathy.  She denies recent infection  REVIEW OF SYSTEMS:   Constitutional: Denies fevers, chills or abnormal weight loss Eyes: Denies blurriness of vision Ears, nose, mouth, throat, and face: Denies mucositis or sore throat Respiratory: Denies cough, dyspnea or wheezes Cardiovascular: Denies palpitation, chest discomfort or lower extremity swelling Gastrointestinal:  Denies nausea, heartburn or change in bowel habits Skin: Denies abnormal skin rashes Lymphatics: Denies new lymphadenopathy or easy bruising Neurological:Denies numbness, tingling or new weaknesses Behavioral/Psych: Mood is stable, no new changes  All other systems were reviewed with the patient and are negative.  I have reviewed the past medical history, past surgical history, social history and family history with the patient and they are unchanged from previous note.  ALLERGIES:  is allergic to gluten meal; influenza virus vacc split pf; nutrasweet aspartame [aspartame]; salicylates cross reactors; and sulfa antibiotics.  MEDICATIONS:  Current Outpatient Prescriptions  Medication Sig Dispense Refill  . acyclovir (ZOVIRAX) 200 MG capsule Take 200 mg by mouth daily. Patient not sure of dosage amount    . Calcium Carbonate-Vitamin D (CALCIUM 600+D) 600-200 MG-UNIT TABS Take 1 tablet by mouth 2 (two) times daily.    . Multiple Vitamin (MULTIVITAMIN WITH MINERALS) TABS tablet Take 1 tablet by mouth daily.    . Omega-3 Fatty Acids (FISH OIL) 1000 MG CAPS Take by mouth daily.    . ondansetron (ZOFRAN ODT) 4 MG disintegrating tablet 74m ODT q6 hours prn nausea/vomit 8 tablet 0  . PAZEO 0.7 % SOLN Place 1 drop into both eyes daily.  4  . prednisoLONE acetate (PRED FORTE) 1 % ophthalmic suspension Place 1 drop into the right eye 2 (two) times daily.  4  . lidocaine-prilocaine (EMLA) cream Apply 1 application topically as needed. 30 g 6  .  predniSONE (DELTASONE) 20 MG tablet Take 1 tablet (20 mg total) by  mouth daily with breakfast. (Patient not taking: Reported on 05/31/2017) 30 tablet 1   No current facility-administered medications for this visit.     PHYSICAL EXAMINATION: ECOG PERFORMANCE STATUS: 0 - Asymptomatic  Vitals:   05/31/17 0934  BP: 129/70  Pulse: 82  Resp: 18  Temp: 98.7 F (37.1 C)   Filed Weights   05/31/17 0934  Weight: 119 lb 4.8 oz (54.1 kg)    GENERAL:alert, no distress and comfortable SKIN: skin color, texture, turgor are normal, no rashes or significant lesions EYES: normal, Conjunctiva are pink and non-injected, sclera clear Musculoskeletal:no cyanosis of digits and no clubbing  NEURO: alert & oriented x 3 with fluent speech, no focal motor/sensory deficits  LABORATORY DATA:  I have reviewed the data as listed    Component Value Date/Time   NA 143 05/30/2017 1052   K 4.1 05/30/2017 1052   CL 99 (L) 02/19/2017 0951   CL 106 03/21/2013 1302   CO2 26 05/30/2017 1052   GLUCOSE 89 05/30/2017 1052   GLUCOSE 87 03/21/2013 1302   BUN 10.8 05/30/2017 1052   CREATININE 0.7 05/30/2017 1052   CALCIUM 9.3 05/30/2017 1052   PROT 5.8 (L) 05/30/2017 1052   ALBUMIN 3.5 05/30/2017 1052   AST 50 (H) 05/30/2017 1052   ALT 26 05/30/2017 1052   ALKPHOS 104 05/30/2017 1052   BILITOT 0.64 05/30/2017 1052   GFRNONAA >60 02/19/2017 0951   GFRAA >60 02/19/2017 0951    No results found for: SPEP, UPEP  Lab Results  Component Value Date   WBC 2.0 (L) 05/30/2017   NEUTROABS 0.7 (L) 05/30/2017   HGB 13.2 05/30/2017   HCT 40.9 05/30/2017   MCV 95.1 05/30/2017   PLT 123 (L) 05/30/2017      Chemistry      Component Value Date/Time   NA 143 05/30/2017 1052   K 4.1 05/30/2017 1052   CL 99 (L) 02/19/2017 0951   CL 106 03/21/2013 1302   CO2 26 05/30/2017 1052   BUN 10.8 05/30/2017 1052   CREATININE 0.7 05/30/2017 1052      Component Value Date/Time   CALCIUM 9.3 05/30/2017 1052   ALKPHOS 104 05/30/2017 1052   AST 50 (H) 05/30/2017 1052   ALT 26 05/30/2017  1052   BILITOT 0.64 05/30/2017 1052       RADIOGRAPHIC STUDIES: I have personally reviewed the radiological images as listed and agreed with the findings in the report. Nm Pet Image Restag (ps) Skull Base To Thigh  Addendum Date: 05/30/2017   ADDENDUM REPORT: 05/30/2017 15:44 ADDENDUM: The index left level 5 cervical lymph node measuring 2 cm on today's study previously measured 1.3 cm. The index right level 4 cervical node measures 1.6 cm is on today's study, previously 1.4 cm. The index right axillary node measures 1.3 cm on today's study. Previously 1.1 cm. The index right paratracheal node measures 1.4 cm on the current study. Previously 1.1 cm. Within the abdomen the large retroperitoneal nodal mass measures 9.2 by 4.6 cm. This is compared with 8.7 x 4.8 cm previously. Within the right external iliac lymph node chain there is a 1.3 cm lymph node on today's study. Previously this measured 1 cm. Left external iliac lymph node measures 1.2 cm on today's study. Previously 1 cm. IMPRESSION: 1. When compared with 02/19/2017 the index lymph nodes within the neck, chest, abdomen and pelvis demonstrate mild increase in size in the  interval. Electronically Signed   By: Kerby Moors M.D.   On: 05/30/2017 15:44   Result Date: 05/30/2017 CLINICAL DATA:  Subsequent treatment strategy for chronic lymphocytic leukemia. EXAM: NUCLEAR MEDICINE PET SKULL BASE TO THIGH TECHNIQUE: 5.8 mCi F-18 FDG was injected intravenously. Full-ring PET imaging was performed from the skull base to thigh after the radiotracer. CT data was obtained and used for attenuation correction and anatomic localization. FASTING BLOOD GLUCOSE:  Value: 88 mg/dl COMPARISON:  02/19/2017 FINDINGS: NECK Bilateral cervical adenopathy is again noted. Index left level 5 node measures 2 cm and has an SUV max equal to 1.6. Index right level 4 lymph node measures 1.6 cm and has an SUV max equal to 2.03. CHEST Multiple enlarged axillary lymph nodes are  identified bilaterally. Index left axillary node measures 11 mm and has an SUV max equal to 1.9. Index right axillary node measures 1.3 cm and has an SUV max equal to 1.8. Index right paratracheal lymph node measures 1.4 cm and has an SUV max equal to 2.5. Moderate cardiac enlargement. Aortic atherosclerosis noted. No pericardial effusion. No pleural effusion.  No suspicious pulmonary nodules identified. ABDOMEN/PELVIS No abnormal hypermetabolic activity within the liver, pancreas, adrenal glands, or spleen. The spleen measures 9 cm in length. No focal hypermetabolism identified. Extensive abdominal adenopathy is identified. Large retroperitoneal nodal mass measures 9.2 x 4.6 cm and has an SUV max equal to 2.9. Bilateral pelvic adenopathy is again noted. Index left external iliac node measures 1.2 cm and has an SUV max equal to 1.9. Right external iliac node measures 1.3 cm and has an SUV max equal to 1.75. SKELETON No focal hypermetabolic activity to suggest skeletal metastasis. IMPRESSION: 1. Examination is positive for adenopathy within the neck, chest, abdomen and pelvis. There is mild FDG uptake associated with the lymph nodes with SUV max ranging from 1.6-2.9. The largest nodal masses in the abdomen measuring 9.2 x 4.6 cm. Electronically Signed: By: Kerby Moors M.D. On: 05/30/2017 13:48    ASSESSMENT & PLAN:  CLL (chronic lymphocytic leukemia) ([place]) Unfortunately, she had no response to recent treatment with Gazyva. I reviewed with her the current guidelines. The patient cannot afford treatment with ibrutinib. I think all of the recommendations listed at the current guidelines would require oral chemotherapy which the patient cannot afford I recommend a trial of bendamustine and rituximab We discussed the role of chemotherapy is of palliative intent The decision was made based on publication in the Blood: Randomized trial of bendamustine-rituximab or R-CHOP/R-CVP in first-line treatment of  indolent NHL or MCL: the BRIGHT study.  [place] [address], Lucianne Lei der [address], [place] BS, [address] M, Kwan YL, Simpson D, Craig M, Kolibaba K, Issa S, [place], [place] DM, Munteanu M, Aram Beecham JM SO  Blood. 2014;123(19):2944.  The chemotherapy consists of   1. Bendamustine at 90 mg/m2 on day 1 & 2 2. Rituximab at 375 mg/m2 on day 1 Each cycle + 28 days  Plan for 6 cycles total with or without Neulasta support  In the international phase III BRIGHT trial, 447 previously untreated patients with advanced stage follicular (n = 709 patients), mantle cell (n = 74 patients), or other indolent lymphoma were randomly assigned to six cycles of BR according to the same dose and schedule described above or to R-CHOP or R-CVP. BR resulted in similar complete (31 versus 25 percent) and overall (97 versus 91 percent) response rates. BR was associated with higher  rates of vomiting and drug hypersensitivity and lower rates of peripheral neuropathy/paresthesia and alopecia. The use of prophylactic antiemetics was not specified in the protocol and was more common among patients assigned to R-CHOP.   We discussed some of the risks, benefits and side-effects of Rituximab with Bendamustine.   Some of the short term side-effects included, though not limited to, risk of fatigue, weight loss, tumor lysis syndrome, risk of allergic reactions, pancytopenia, life-threatening infections, need for transfusions of blood products, nausea, vomiting, change in bowel habits, admission to hospital for various reasons, and risks of death.   Long term side-effects are also discussed including permanent damage to nerve function, chronic fatigue, and rare secondary malignancy including bone marrow disorders.   The patient is aware that the response rates discussed earlier is not guaranteed.    After a long discussion, patient made an informed decision to proceed with the prescribed plan of care.    Patient education material was dispensed I recommend port placement and she agreed to proceed     Drug-induced neutropenia ([place]) We will proceed with treatment without dose adjustment. With her baseline leukopenia, I recommend Neulasta support  Elevated liver enzymes Cause unknown but could be related to treatment.  Monitor closely  Goals of care, counseling/discussion The patient is aware she has incurable disease and treatment is strictly palliative. We discussed importance of Advanced Directives and Living will. We discussed CODE STATUS; the patient desires to remain in full code.   Orders Placed This Encounter  Procedures  . IR FLUORO GUIDE PORT INSERTION RIGHT    Standing Status:   Future    Standing Expiration Date:   07/31/2018    Order Specific Question:   Reason for Exam (SYMPTOM  OR DIAGNOSIS REQUIRED)    Answer:   need port for chemo access    Order Specific Question:   Preferred Imaging Location?    Answer:   [facility]  . CBC with Differential    Standing Status:   Standing    Number of Occurrences:   20    Standing Expiration Date:   06/02/2018  . Comprehensive metabolic panel    Standing Status:   Standing    Number of Occurrences:   20    Standing Expiration Date:   06/02/2018   All questions were answered. The patient knows to call the clinic with any problems, questions or concerns. No barriers to learning was detected. I spent 30 minutes counseling the patient face to face. The total time spent in the appointment was 40 minutes and more than 50% was on counseling and review of test results     Heath Lark, MD 06/01/2017 8:41 AM

## 2017-06-01 NOTE — Assessment & Plan Note (Signed)
Unfortunately, she had no response to recent treatment with Gazyva. I reviewed with her the current guidelines. The patient cannot afford treatment with ibrutinib. I think all of the recommendations listed at the current guidelines would require oral chemotherapy which the patient cannot afford I recommend a trial of bendamustine and rituximab We discussed the role of chemotherapy is of palliative intent The decision was made based on publication in the Blood: Randomized trial of bendamustine-rituximab or R-CHOP/R-CVP in first-line treatment of indolent NHL or MCL: the BRIGHT study.  [place] [address], Lucianne Lei der [address], [place] BS, [address] M, Kwan YL, Simpson D, Craig M, Kolibaba K, Issa S, [place], [place] DM, Munteanu M, Aram Beecham JM SO  Blood. 2014;123(19):2944.  The chemotherapy consists of   1. Bendamustine at 90 mg/m2 on day 1 & 2 2. Rituximab at 375 mg/m2 on day 1 Each cycle + 28 days  Plan for 6 cycles total with or without Neulasta support  In the international phase III BRIGHT trial, 447 previously untreated patients with advanced stage follicular (n = 166 patients), mantle cell (n = 74 patients), or other indolent lymphoma were randomly assigned to six cycles of BR according to the same dose and schedule described above or to R-CHOP or R-CVP. BR resulted in similar complete (31 versus 25 percent) and overall (97 versus 91 percent) response rates. BR was associated with higher rates of vomiting and drug hypersensitivity and lower rates of peripheral neuropathy/paresthesia and alopecia. The use of prophylactic antiemetics was not specified in the protocol and was more common among patients assigned to R-CHOP.   We discussed some of the risks, benefits and side-effects of Rituximab with Bendamustine.   Some of the short term side-effects included, though not limited to, risk of fatigue, weight loss, tumor lysis syndrome, risk of allergic reactions,  pancytopenia, life-threatening infections, need for transfusions of blood products, nausea, vomiting, change in bowel habits, admission to hospital for various reasons, and risks of death.   Long term side-effects are also discussed including permanent damage to nerve function, chronic fatigue, and rare secondary malignancy including bone marrow disorders.   The patient is aware that the response rates discussed earlier is not guaranteed.    After a long discussion, patient made an informed decision to proceed with the prescribed plan of care.   Patient education material was dispensed I recommend port placement and she agreed to proceed

## 2017-06-01 NOTE — Telephone Encounter (Signed)
Continue Acyclovir I will add Onpro

## 2017-06-01 NOTE — Assessment & Plan Note (Signed)
Cause unknown but could be related to treatment.  Monitor closely

## 2017-06-01 NOTE — Telephone Encounter (Signed)
Pt states she would like to try Onpro.  Wants to know if she should stop acyclovir.

## 2017-06-01 NOTE — Telephone Encounter (Signed)
Notified to continue acyclovir

## 2017-06-06 ENCOUNTER — Other Ambulatory Visit: Payer: Self-pay | Admitting: Hematology and Oncology

## 2017-06-06 ENCOUNTER — Other Ambulatory Visit: Payer: Self-pay

## 2017-06-06 ENCOUNTER — Telehealth: Payer: Self-pay

## 2017-06-06 MED ORDER — PREDNISONE 5 MG PO TABS: 10.0000 mg | ORAL_TABLET | Freq: Every day | ORAL | 0 refills | Status: AC

## 2017-06-06 MED ORDER — IBRUTINIB 420 MG PO TABS: 420.0000 mg | ORAL_TABLET | Freq: Every day | ORAL | 11 refills | Status: DC

## 2017-06-06 NOTE — Telephone Encounter (Signed)
Called with below message. Verbalized understanding. States she needs a refill on prednisone, will e-scrib Rx.

## 2017-06-06 NOTE — Telephone Encounter (Signed)
Patient left a message to call her back. Called patient back, she wants Dr. Alvy Bimler to know that she will take the capsules.  She just wants to do whatever Dr. Alvy Bimler thinks is the best thing for her to do since she had progressed and Dr. Alvy Bimler is the expert. She just wants to do the best thing and they are calling about the port placement. She said she wait to here back from the office.

## 2017-06-06 NOTE — Telephone Encounter (Signed)
Please call to verify; if I understand correctly, she wants to try Ibrutinib? If so, please proceed to get  1) Insurance prior auth for Ibrutinib  2) cancel chemo next week 3) call IR to cancel order for port 4) Instruct patient to continue 10 mg prednisone daily and call use immediately once she has Ibrutinib pills to start

## 2017-06-13 ENCOUNTER — Ambulatory Visit: Payer: Medicare Other

## 2017-06-13 ENCOUNTER — Other Ambulatory Visit: Payer: Medicare Other

## 2017-06-14 ENCOUNTER — Ambulatory Visit: Payer: Medicare Other

## 2017-06-15 ENCOUNTER — Telehealth: Payer: Self-pay | Admitting: Pharmacist

## 2017-06-15 DIAGNOSIS — C9110 Chronic lymphocytic leukemia of B-cell type not having achieved remission: Secondary | ICD-10-CM

## 2017-06-15 MED ORDER — IBRUTINIB 420 MG PO TABS: 420.0000 mg | ORAL_TABLET | Freq: Every day | ORAL | 11 refills | Status: AC

## 2017-06-15 MED FILL — IMBRUVICA 420 MG TAB: 420 | 28 days supply | Qty: 28 | Fill #0

## 2017-06-15 NOTE — Telephone Encounter (Signed)
Oral Chemotherapy Pharmacist Encounter  Received new prescription for Imbruvica for the treatment of chronic lymphocytic leukemia.  Labs from 05/31/17 reviewed, OK for treatment  Current medication list in Epic assessed, Category C interaction with fish oil identified due to anti-platelet action of both agents. Patient will be counseled to discontinue fish oil while on therapy with Imbruvica to decrease risk of bleeding.  I spoke with patient for overview of new oral chemotherapy medication: Imbruvica. Pt is doing well. The prescriptions have been sent to the [facility] for benefit analysis and approval.   Prior authorization obtained in Jan 2018 Case ID: [id number] Effective dates: 01/05/17-01/06/2020  Copayment at [facility] ~$2800 Patient with income too high to qualify for copayment assistance, patient aware. Patient states she was quoted copayment of ~$2000 from [facility] in January of this year. We will investigate best option for copayment and send prescription to that pharmacy.  Counseled patient on administration, dosing, side effects, safe handling, and monitoring. Patient will take 420mg  tablet by mouth once daily, at about the same time each day, with a full glass of water. Side effects include but not limited to: increased risk of bleeding/hemmorhage, bruising, EKG changes, GI upset, muscle pain, and fatigue.  Brenda Terrell voiced understanding and appreciation.   All questions answered.  Will follow up with patient regarding pharmacy and copayment.   Thank you,  Johny Drilling, PharmD, BCPS, BCOP 06/15/2017  9:56 AM [facility] [phone]

## 2017-06-20 NOTE — Telephone Encounter (Signed)
-----   Message from Heath Lark, MD sent at 06/20/2017 12:45 PM EDT ----- Regarding: FW: status of ibrutinib? Can you call and ask if she has started Ibrutinib? ----- Message ----- From: Enis Gash, [facility] Sent: 06/20/2017  12:41 PM To: Heath Lark, MD Subject: RE: status of ibrutinib?                       Pretty sure she picked it up already. Couldn't help her copayment but was able to explain her insurance benefits to her so she knows what to expect. Brenda Terrell  ----- Message ----- From: Heath Lark, MD Sent: 06/20/2017  12:22 PM To: Enis Gash, RPH Subject: status of ibrutinib?                           Any luck?

## 2017-06-20 NOTE — Telephone Encounter (Signed)
Tell her I have placed scheduling msg for labs to be drawn on 7/6 and another before her appt on 7/16 to monitor her labs. No need to be fasting for labs

## 2017-06-20 NOTE — Telephone Encounter (Signed)
Called with below message. She started on Friday 6-22 taking her Ibrutinib and has had no problems. She said she called Friday and told someone that she had started the new Rx.

## 2017-06-20 NOTE — Telephone Encounter (Signed)
Called with below message, verbalized understanding. 

## 2017-06-25 ENCOUNTER — Telehealth: Payer: Self-pay | Admitting: Hematology and Oncology

## 2017-06-25 NOTE — Telephone Encounter (Signed)
sw pt to confirm 7/6 lab appt per sch msg

## 2017-07-01 ENCOUNTER — Telehealth: Payer: Self-pay | Admitting: *Deleted

## 2017-07-01 ENCOUNTER — Other Ambulatory Visit (HOSPITAL_BASED_OUTPATIENT_CLINIC_OR_DEPARTMENT_OTHER): Payer: Medicare Other

## 2017-07-01 DIAGNOSIS — C9110 Chronic lymphocytic leukemia of B-cell type not having achieved remission: Secondary | ICD-10-CM | POA: Diagnosis present

## 2017-07-01 LAB — COMPREHENSIVE METABOLIC PANEL
ALBUMIN: 3.6 g/dL (ref 3.5–5.0)
ALK PHOS: 90 U/L (ref 40–150)
ALT: 21 U/L (ref 0–55)
AST: 37 U/L — AB (ref 5–34)
Anion Gap: 7 mEq/L (ref 3–11)
BILIRUBIN TOTAL: 0.52 mg/dL (ref 0.20–1.20)
BUN: 17.3 mg/dL (ref 7.0–26.0)
CO2: 26 mEq/L (ref 22–29)
Calcium: 9.1 mg/dL (ref 8.4–10.4)
Chloride: 107 mEq/L (ref 98–109)
Creatinine: 0.8 mg/dL (ref 0.6–1.1)
EGFR: 86 mL/min/{1.73_m2} — ABNORMAL LOW (ref >90)
GLUCOSE: 85 mg/dL (ref 70–140)
Potassium: 4.1 mEq/L (ref 3.5–5.1)
Sodium: 140 mEq/L (ref 136–145)
TOTAL PROTEIN: 5.7 g/dL — AB (ref 6.4–8.3)

## 2017-07-01 LAB — CBC WITH DIFFERENTIAL/PLATELET
BASO%: 0.7 % (ref 0.0–2.0)
BASOS ABS: 0 10*3/uL (ref 0.0–0.1)
EOS%: 1.5 % (ref 0.0–7.0)
Eosinophils Absolute: 0.1 10*3/uL (ref 0.0–0.5)
HCT: 40 % (ref 34.8–46.6)
HEMOGLOBIN: 13.1 g/dL (ref 11.6–15.9)
LYMPH#: 4.7 10*3/uL — AB (ref 0.9–3.3)
LYMPH%: 76.9 % — ABNORMAL HIGH (ref 14.0–49.7)
MCH: 30.5 pg (ref 25.1–34.0)
MCHC: 32.8 g/dL (ref 31.5–36.0)
MCV: 93.2 fL (ref 79.5–101.0)
MONO#: 0.5 10*3/uL (ref 0.1–0.9)
MONO%: 7.5 % (ref 0.0–14.0)
NEUT%: 13.4 % — ABNORMAL LOW (ref 38.4–76.8)
NEUTROS ABS: 0.8 10*3/uL — AB (ref 1.5–6.5)
NRBC: 0 % (ref 0–0)
Platelets: 129 10*3/uL — ABNORMAL LOW (ref 145–400)
RBC: 4.29 10*6/uL (ref 3.70–5.45)
RDW: 14.9 % — AB (ref 11.2–14.5)
WBC: 6.1 10*3/uL (ref 3.9–10.3)

## 2017-07-01 LAB — LACTATE DEHYDROGENASE: LDH: 191 U/L (ref 125–245)

## 2017-07-01 LAB — URIC ACID: Uric Acid, Serum: 6.4 mg/dl (ref 2.6–7.4)

## 2017-07-01 NOTE — Telephone Encounter (Signed)
LM with note below. Requested that she call us back for further instructions

## 2017-07-01 NOTE — Telephone Encounter (Signed)
-----   Message from Heath Lark, MD sent at 07/01/2017  3:28 PM EDT ----- Regarding: labs today She is neutropenic, advise neutropenic precaution Continue Ibrutinib ----- Message ----- From: Interface, Lab In Three Zero One Sent: 07/01/2017  11:48 AM To: Heath Lark, MD

## 2017-07-07 DIAGNOSIS — H35033 Hypertensive retinopathy, bilateral: Secondary | ICD-10-CM | POA: Diagnosis not present

## 2017-07-07 DIAGNOSIS — H16223 Keratoconjunctivitis sicca, not specified as Sjogren's, bilateral: Secondary | ICD-10-CM | POA: Diagnosis not present

## 2017-07-07 DIAGNOSIS — H20041 Secondary noninfectious iridocyclitis, right eye: Secondary | ICD-10-CM | POA: Diagnosis not present

## 2017-07-07 DIAGNOSIS — H401131 Primary open-angle glaucoma, bilateral, mild stage: Secondary | ICD-10-CM | POA: Diagnosis not present

## 2017-07-08 MED FILL — IMBRUVICA 420 MG TAB: 420 | 28 days supply | Qty: 28 | Fill #1

## 2017-07-11 ENCOUNTER — Ambulatory Visit: Payer: Medicare Other

## 2017-07-11 ENCOUNTER — Other Ambulatory Visit: Payer: Self-pay

## 2017-07-11 ENCOUNTER — Ambulatory Visit (HOSPITAL_BASED_OUTPATIENT_CLINIC_OR_DEPARTMENT_OTHER): Payer: Medicare Other | Admitting: Hematology and Oncology

## 2017-07-11 ENCOUNTER — Telehealth: Payer: Self-pay | Admitting: Hematology and Oncology

## 2017-07-11 ENCOUNTER — Other Ambulatory Visit: Payer: Medicare Other

## 2017-07-11 ENCOUNTER — Other Ambulatory Visit (HOSPITAL_BASED_OUTPATIENT_CLINIC_OR_DEPARTMENT_OTHER): Payer: Medicare Other

## 2017-07-11 DIAGNOSIS — C9110 Chronic lymphocytic leukemia of B-cell type not having achieved remission: Secondary | ICD-10-CM

## 2017-07-11 DIAGNOSIS — I499 Cardiac arrhythmia, unspecified: Secondary | ICD-10-CM | POA: Diagnosis not present

## 2017-07-11 DIAGNOSIS — D702 Other drug-induced agranulocytosis: Secondary | ICD-10-CM

## 2017-07-11 DIAGNOSIS — D696 Thrombocytopenia, unspecified: Secondary | ICD-10-CM | POA: Diagnosis not present

## 2017-07-11 DIAGNOSIS — R600 Localized edema: Secondary | ICD-10-CM

## 2017-07-11 LAB — LACTATE DEHYDROGENASE: LDH: 210 U/L (ref 125–245)

## 2017-07-11 LAB — CBC WITH DIFFERENTIAL/PLATELET
BASO%: 1.3 % (ref 0.0–2.0)
Basophils Absolute: 0.1 10*3/uL (ref 0.0–0.1)
EOS ABS: 0.1 10*3/uL (ref 0.0–0.5)
EOS%: 2 % (ref 0.0–7.0)
HEMATOCRIT: 42.8 % (ref 34.8–46.6)
HEMOGLOBIN: 13.7 g/dL (ref 11.6–15.9)
LYMPH#: 2.9 10*3/uL (ref 0.9–3.3)
LYMPH%: 72 % — ABNORMAL HIGH (ref 14.0–49.7)
MCH: 30.4 pg (ref 25.1–34.0)
MCHC: 32 g/dL (ref 31.5–36.0)
MCV: 95.1 fL (ref 79.5–101.0)
MONO#: 0.3 10*3/uL (ref 0.1–0.9)
MONO%: 6.8 % (ref 0.0–14.0)
NEUT#: 0.7 10*3/uL — ABNORMAL LOW (ref 1.5–6.5)
NEUT%: 17.9 % — ABNORMAL LOW (ref 38.4–76.8)
Platelets: 114 10*3/uL — ABNORMAL LOW (ref 145–400)
RBC: 4.5 10*6/uL (ref 3.70–5.45)
RDW: 15.1 % — AB (ref 11.2–14.5)
WBC: 4 10*3/uL (ref 3.9–10.3)

## 2017-07-11 LAB — COMPREHENSIVE METABOLIC PANEL
ALBUMIN: 3.8 g/dL (ref 3.5–5.0)
ALK PHOS: 94 U/L (ref 40–150)
ALT: 22 U/L (ref 0–55)
AST: 39 U/L — AB (ref 5–34)
Anion Gap: 11 mEq/L (ref 3–11)
BILIRUBIN TOTAL: 0.43 mg/dL (ref 0.20–1.20)
BUN: 12.9 mg/dL (ref 7.0–26.0)
CALCIUM: 9.7 mg/dL (ref 8.4–10.4)
CO2: 25 mEq/L (ref 22–29)
Chloride: 107 mEq/L (ref 98–109)
Creatinine: 0.8 mg/dL (ref 0.6–1.1)
EGFR: 89 mL/min/{1.73_m2} — AB (ref >90)
Glucose: 71 mg/dl (ref 70–140)
POTASSIUM: 4.2 meq/L (ref 3.5–5.1)
Sodium: 142 mEq/L (ref 136–145)
Total Protein: 6.1 g/dL — ABNORMAL LOW (ref 6.4–8.3)

## 2017-07-11 LAB — URIC ACID: Uric Acid, Serum: 5.8 mg/dl (ref 2.6–7.4)

## 2017-07-11 MED ORDER — METOPROLOL TARTRATE 25 MG PO TABS: 12.5000 mg | ORAL_TABLET | Freq: Two times a day (BID) | ORAL | 1 refills | Status: DC

## 2017-07-11 NOTE — Telephone Encounter (Signed)
Gave patient avs report for August.

## 2017-07-12 ENCOUNTER — Encounter: Payer: Self-pay | Admitting: Hematology and Oncology

## 2017-07-12 ENCOUNTER — Ambulatory Visit: Payer: Medicare Other

## 2017-07-12 NOTE — Assessment & Plan Note (Signed)
She has mild irregular heartbeat. Stat EKG in my office show sinus rhythm with occasional premature complex. I recommend trial of low-dose metoprolol I will reassess again in her next visit

## 2017-07-12 NOTE — Progress Notes (Signed)
[facility] OFFICE PROGRESS NOTE  Patient Care Team: Kelton Pillar, MD as PCP - General (Family Medicine)  SUMMARY OF ONCOLOGIC HISTORY:   CLL (chronic lymphocytic leukemia) ([place])   11/29/2001 Pathology Results    [phone] Left neck biopsy: LEFT NECK LYMPH NODE, EXCISION: SMALL LYMPHOCYTIC LYMPHOMA/CHRONIC LYMPHOCYTIC LEUKEMIA, SEE COMMENT  COMMENT Sections show effacement of the lymph node architecture by uniform small lymphoid cells displaying chromatin clumping and small to inconspicuous nucleoli. This is associated with numerous proliferation centers (pseudofollicular centers). There is no evidence of high grade transformation. Flow cytometric analysis showed a monoclonal, lambda restricted B cell population expressing pan B cell antigens including CD20 and CD23 with coexpression of CD5 and CD20. 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      01/04/2002 Bone Marrow Biopsy    BONE MARROW, TOUCH IMPRINTS AND CORE BIOPSY: INVOLVEMENT BY SMALL LYMPHOCYTIC LYMPHOMA/CHRONIC LYMPHOCYTIC LEUKEMIA      01/06/2004 Miscellaneous    She was treated for the CLL with Rituxan in 2005/ 2006 and again Jan 2008 thru Feb 2009,with good response, butheld then due to complaints of transient short term memory loss after each RItuxan treatment      10/31/2016 PET scan    Diffuse cervical, axillary, retroperitoneal, and pelvic adenopathy is noted which exhibits very mild increased FDG uptake. I suspect the mild level of increased FDG uptake reflects the inherent hypometabolic state of the tumor as opposed to lack of viable tumor.  2. Brown fat within bilateral supraclavicular and paraspinal fat.      11/12/2016 Imaging    Ct scan showed significant progression of bulky confluent retroperitoneal and bilateral pelvic lymphadenopathy. Mild progression of thoracic lymphadenopathy involving the bilateral axillary, mediastinal and bilateral  hilar chains. 2. New extensive fine perilymphatic distribution nodularity and interlobular septal thickening throughout both lungs, most consistent with lymphangitic tumor, as can be seen in lymphoproliferative disorders. 3. Normal size spleen. 4. Additional findings include aortic atherosclerosis, stable 2.0 cm inferior thyroid isthmus nodule mild sigmoid diverticulosis, mild superior L1 vertebral compression fracture of indeterminate chronicity (new since 03/06/2013), and increased size of moderate midline high ventral abdominal wall hernia containing fat and fluid.      12/31/2016 Pathology Results    Lymph node for lymphoma, left supraclavicular - SMALL LYMPHOCYTIC LYMPHOMA. - SEE ONCOLOGY TABLE. Microscopic Comment LYMPHOMA Histologic type: Non-Hodgkin lymphoma, small lymphocytic type. Grade (if applicable): Low grade. Flow cytometry: Monoclonal, lambda restricted B-cell population expressing pan B-cell antigens including CD20 associated with dim CD5 expression. No CD10 expression is identified ([id number]). Immunohistochemical stains: CD10, CD20, CD3, CD43, CD5, CD79a, and cyclin D1 with appropriate controls. Touch preps/imprints: Predominance of small lymphoid cells with high nuclear cytoplasmic ratio, coarse chromatin and small to inconspicuous nucleoli admixed with larger lymphoid cells to a much lesser extent. Comments: The sections show lymph nodal tissue displaying effacement of the architecture by a lymphoproliferative process characterized by predominance of small lymphoid cells with high nuclear cytoplasmic ratio, round to slightly irregular nuclei, coarse chromatin and small to inconspicuous nucleoli. This is associated with abundant variably sized proliferation centers (pseudofollicular centers) imparting a vaguely nodular pattern. Flow cytometric analysis shows a monoclonal, lambda restricted B-cell population expressing pan B-cell antigens associated with dim CD5 expression. In addition,  immunohistochemical stains were performed and show that the lymphoid cells are predominantly composed of B cells as seen with CD20 and CD79a associated with co-expression with CD43 and weak co-expression with CD5. No significant CD10 or cyclin D1 positivity  is identified. There is admixed T-cell population to a lesser extent as primarily seen with CD3. The findings are consistent with involvement by small lymphocytic lymphoma/chronic lymphocytic leukemia. Clinical correlation is recommended. (BNS:ecj 01/04/2017)      12/31/2016 Surgery    She had excisional biopsy left supraclavicular lymph nodes      02/14/2017 - 05/03/2017 Chemotherapy    The patient had treatment with Gazyva. She has delay in starting treatment due to inability to get insurance approval for ibrutinib.      02/21/2017 Adverse Reaction    Treatment was delayed by 1 week due to pancytopenia      05/30/2017 PET scan    The index left level 5 cervical lymph node measuring 2 cm on today's study previously measured 1.3 cm. The index right level 4 cervical node measures 1.6 cm is on today's study, previously 1.4 cm. The index right axillary node measures 1.3 cm on today's study. Previously 1.1 cm. The index right paratracheal node measures 1.4 cm on the current study. Previously 1.1 cm. Within the abdomen the large retroperitoneal nodal mass measures 9.2 by 4.6 cm. This is compared with 8.7 x 4.8 cm previously. Within the right external iliac lymph node chain there is a 1.3 cm lymph node on today's study. Previously this measured 1 cm. Left external iliac lymph node measures 1.2 cm on today's study. Previously 1 cm.  IMPRESSION: 1. When compared with 02/19/2017 the index lymph nodes within the neck, chest, abdomen and pelvis demonstrate mild increase in size in the interval.      06/17/2017 -  Chemotherapy    She started taking Ibrutinib       INTERVAL HISTORY: Please see below for problem oriented charting. She returns for further  follow-up. Lymphadenopathy has reduced in size. Her leg swelling is improving. She complain of mild diarrhea, skin bruising and occasional palpitation. Denies recent fever, chills, infection or cough.  REVIEW OF SYSTEMS:   Constitutional: Denies fevers, chills or abnormal weight loss Eyes: Denies blurriness of vision Ears, nose, mouth, throat, and face: Denies mucositis or sore throat Respiratory: Denies cough, dyspnea or wheezes Gastrointestinal:  Denies nausea, heartburn or change in bowel habits Skin: Denies abnormal skin rashes Neurological:Denies numbness, tingling or new weaknesses Behavioral/Psych: Mood is stable, no new changes  All other systems were reviewed with the patient and are negative.  I have reviewed the past medical history, past surgical history, social history and family history with the patient and they are unchanged from previous note.  ALLERGIES:  is allergic to gluten meal; influenza virus vacc split pf; nutrasweet aspartame [aspartame]; salicylates cross reactors; and sulfa antibiotics.  MEDICATIONS:  Current Outpatient Prescriptions  Medication Sig Dispense Refill  . acyclovir (ZOVIRAX) 200 MG capsule Take 200 mg by mouth daily. Patient not sure of dosage amount    . Calcium Carbonate-Vitamin D (CALCIUM 600+D) 600-200 MG-UNIT TABS Take 1 tablet by mouth 2 (two) times daily.    . Ibrutinib 420 MG TABS Take 420 mg by mouth daily. 30 tablet 11  . lidocaine-prilocaine (EMLA) cream Apply 1 application topically as needed. 30 g 6  . metoprolol tartrate (LOPRESSOR) 25 MG tablet Take 0.5 tablets (12.5 mg total) by mouth 2 (two) times daily. 30 tablet 1  . Multiple Vitamin (MULTIVITAMIN WITH MINERALS) TABS tablet Take 1 tablet by mouth daily.    . Omega-3 Fatty Acids (FISH OIL) 1000 MG CAPS Take by mouth daily.    . ondansetron (ZOFRAN ODT) 4 MG disintegrating tablet  $'4mg'T$  ODT q6 hours prn nausea/vomit 8 tablet 0  . PAZEO 0.7 % SOLN Place 1 drop into both eyes daily.   4  . prednisoLONE acetate (PRED FORTE) 1 % ophthalmic suspension Place 1 drop into the right eye 2 (two) times daily.  4   No current facility-administered medications for this visit.     PHYSICAL EXAMINATION: ECOG PERFORMANCE STATUS: 1 - Symptomatic but completely ambulatory  Vitals:   07/11/17 0842  BP: (!) 152/65  Pulse: 68  Temp: 98.6 F (37 C)   Filed Weights   07/11/17 0842  Weight: 122 lb 12.8 oz (55.7 kg)    GENERAL:alert, no distress and comfortable SKIN: skin color, texture, turgor are normal, no rashes or significant lesions EYES: normal, Conjunctiva are pink and non-injected, sclera clear OROPHARYNX:no exudate, no erythema and lips, buccal mucosa, and tongue normal  NECK: supple, thyroid normal size, non-tender, without nodularity LYMPH:  no palpable lymphadenopathy in the cervical, axillary or inguinal.  Previously palpable lymphadenopathy has regressed in size. LUNGS: clear to auscultation and percussion with normal breathing effort HEART: Occasional irregular heartbeat is noted.  No heart murmurs.  Leg edema has improved. ABDOMEN:abdomen soft, non-tender and normal bowel sounds Musculoskeletal:no cyanosis of digits and no clubbing  NEURO: alert & oriented x 3 with fluent speech, no focal motor/sensory deficits  LABORATORY DATA:  I have reviewed the data as listed    Component Value Date/Time   NA 142 07/11/2017 0823   K 4.2 07/11/2017 0823   CL 99 (L) 02/19/2017 0951   CL 106 03/21/2013 1302   CO2 25 07/11/2017 0823   GLUCOSE 71 07/11/2017 0823   GLUCOSE 87 03/21/2013 1302   BUN 12.9 07/11/2017 0823   CREATININE 0.8 07/11/2017 0823   CALCIUM 9.7 07/11/2017 0823   PROT 6.1 (L) 07/11/2017 0823   ALBUMIN 3.8 07/11/2017 0823   AST 39 (H) 07/11/2017 0823   ALT 22 07/11/2017 0823   ALKPHOS 94 07/11/2017 0823   BILITOT 0.43 07/11/2017 0823   GFRNONAA >60 02/19/2017 0951   GFRAA >60 02/19/2017 0951    No results found for: SPEP, UPEP  Lab Results   Component Value Date   WBC 4.0 07/11/2017   NEUTROABS 0.7 (L) 07/11/2017   HGB 13.7 07/11/2017   HCT 42.8 07/11/2017   MCV 95.1 07/11/2017   PLT 114 (L) 07/11/2017      Chemistry      Component Value Date/Time   NA 142 07/11/2017 0823   K 4.2 07/11/2017 0823   CL 99 (L) 02/19/2017 0951   CL 106 03/21/2013 1302   CO2 25 07/11/2017 0823   BUN 12.9 07/11/2017 0823   CREATININE 0.8 07/11/2017 0823      Component Value Date/Time   CALCIUM 9.7 07/11/2017 0823   ALKPHOS 94 07/11/2017 0823   AST 39 (H) 07/11/2017 0823   ALT 22 07/11/2017 0823   BILITOT 0.43 07/11/2017 0823       ASSESSMENT & PLAN:  CLL (chronic lymphocytic leukemia) ([facility]) She tolerated treatment well except for irregular heartbeat, palpitation and neutropenia She appears to be responding well to treatment I would continue same dose without dose adjustment  Irregular heart beat She has mild irregular heartbeat. Stat EKG in my office show sinus rhythm with occasional premature complex. I recommend trial of low-dose metoprolol I will reassess again in her next visit  Drug-induced neutropenia ([place]) We will proceed with treatment without dose adjustment. The patient is advised neutropenic precaution  Bilateral leg edema  Leg edema is improving. Continue observation for now. I do not recommend aggressive diuretic therapy due to risk of dehydration and renal failure  Thrombocytopenia ([place]) This is likely due to recent treatment. The patient denies recent history of bleeding such as epistaxis, hematuria or hematochezia. She is asymptomatic from the low platelet count. I will observe for now.  she does not require transfusion now. I will continue the chemotherapy at current dose without dosage adjustment.  If the thrombocytopenia gets progressive worse in the future, I might have to delay her treatment or adjust the chemotherapy dose.     Orders Placed This Encounter  Procedures  . EKG 12-Lead    Standing  Status:   Standing    Number of Occurrences:   1    Order Specific Question:   Reason for Exam    Answer:   irregular heart neat  . EKG 12-Lead    Ordered by an unspecified provider   . EKG 12-Lead    Ordered by an unspecified provider   . EKG 12-Lead    Ordered by an unspecified provider    All questions were answered. The patient knows to call the clinic with any problems, questions or concerns. No barriers to learning was detected. I spent 30 minutes counseling the patient face to face. The total time spent in the appointment was 40 minutes and more than 50% was on counseling and review of test results     Heath Lark, MD 07/12/2017 7:30 PM

## 2017-07-12 NOTE — Assessment & Plan Note (Signed)
Leg edema is improving. Continue observation for now. I do not recommend aggressive diuretic therapy due to risk of dehydration and renal failure

## 2017-07-12 NOTE — Assessment & Plan Note (Signed)
This is likely due to recent treatment. The patient denies recent history of bleeding such as epistaxis, hematuria or hematochezia. She is asymptomatic from the low platelet count. I will observe for now.  she does not require transfusion now. I will continue the chemotherapy at current dose without dosage adjustment.  If the thrombocytopenia gets progressive worse in the future, I might have to delay her treatment or adjust the chemotherapy dose.   

## 2017-07-12 NOTE — Assessment & Plan Note (Signed)
She tolerated treatment well except for irregular heartbeat, palpitation and neutropenia She appears to be responding well to treatment I would continue same dose without dose adjustment

## 2017-07-12 NOTE — Assessment & Plan Note (Signed)
We will proceed with treatment without dose adjustment. The patient is advised neutropenic precaution

## 2017-08-01 ENCOUNTER — Other Ambulatory Visit: Payer: Self-pay | Admitting: *Deleted

## 2017-08-01 NOTE — Telephone Encounter (Signed)
Pt had called after hours nurse- stated she had a blood nodule in mouth on left cheek.   Mrs Brenda Terrell states is burst while she was on the phone. She has been rinsing with peroxide and at times baking soda. Mouth is not sore and spot is getting better

## 2017-08-02 MED FILL — IMBRUVICA 420 MG TAB: 420 | 28 days supply | Qty: 28 | Fill #2

## 2017-08-04 ENCOUNTER — Ambulatory Visit (HOSPITAL_BASED_OUTPATIENT_CLINIC_OR_DEPARTMENT_OTHER): Payer: Medicare Other | Admitting: Hematology and Oncology

## 2017-08-04 ENCOUNTER — Other Ambulatory Visit (HOSPITAL_BASED_OUTPATIENT_CLINIC_OR_DEPARTMENT_OTHER): Payer: Medicare Other

## 2017-08-04 ENCOUNTER — Telehealth: Payer: Self-pay | Admitting: Hematology and Oncology

## 2017-08-04 ENCOUNTER — Encounter: Payer: Self-pay | Admitting: Hematology and Oncology

## 2017-08-04 DIAGNOSIS — D696 Thrombocytopenia, unspecified: Secondary | ICD-10-CM | POA: Diagnosis not present

## 2017-08-04 DIAGNOSIS — D702 Other drug-induced agranulocytosis: Secondary | ICD-10-CM

## 2017-08-04 DIAGNOSIS — R600 Localized edema: Secondary | ICD-10-CM

## 2017-08-04 DIAGNOSIS — C9110 Chronic lymphocytic leukemia of B-cell type not having achieved remission: Secondary | ICD-10-CM | POA: Diagnosis not present

## 2017-08-04 LAB — CBC WITH DIFFERENTIAL/PLATELET
BASO%: 0.8 % (ref 0.0–2.0)
Basophils Absolute: 0 10*3/uL (ref 0.0–0.1)
EOS%: 1.9 % (ref 0.0–7.0)
Eosinophils Absolute: 0.1 10*3/uL (ref 0.0–0.5)
HEMATOCRIT: 41.1 % (ref 34.8–46.6)
HEMOGLOBIN: 13.3 g/dL (ref 11.6–15.9)
LYMPH#: 3.3 10*3/uL (ref 0.9–3.3)
LYMPH%: 69.1 % — ABNORMAL HIGH (ref 14.0–49.7)
MCH: 30.6 pg (ref 25.1–34.0)
MCHC: 32.4 g/dL (ref 31.5–36.0)
MCV: 94.5 fL (ref 79.5–101.0)
MONO#: 0.5 10*3/uL (ref 0.1–0.9)
MONO%: 9.7 % (ref 0.0–14.0)
NEUT%: 18.5 % — AB (ref 38.4–76.8)
NEUTROS ABS: 0.9 10*3/uL — AB (ref 1.5–6.5)
PLATELETS: 116 10*3/uL — AB (ref 145–400)
RBC: 4.35 10*6/uL (ref 3.70–5.45)
RDW: 14.7 % — ABNORMAL HIGH (ref 11.2–14.5)
WBC: 4.8 10*3/uL (ref 3.9–10.3)

## 2017-08-04 LAB — COMPREHENSIVE METABOLIC PANEL
ALBUMIN: 3.7 g/dL (ref 3.5–5.0)
ALT: 36 U/L (ref 0–55)
ANION GAP: 8 meq/L (ref 3–11)
AST: 57 U/L — ABNORMAL HIGH (ref 5–34)
Alkaline Phosphatase: 98 U/L (ref 40–150)
BILIRUBIN TOTAL: 0.57 mg/dL (ref 0.20–1.20)
BUN: 13.9 mg/dL (ref 7.0–26.0)
CALCIUM: 9.2 mg/dL (ref 8.4–10.4)
CO2: 29 mEq/L (ref 22–29)
CREATININE: 0.7 mg/dL (ref 0.6–1.1)
Chloride: 106 mEq/L (ref 98–109)
EGFR: >90 mL/min/{1.73_m2} (ref >90)
Glucose: 85 mg/dl (ref 70–140)
Potassium: 4.8 mEq/L (ref 3.5–5.1)
Sodium: 143 mEq/L (ref 136–145)
TOTAL PROTEIN: 5.8 g/dL — AB (ref 6.4–8.3)

## 2017-08-04 LAB — LACTATE DEHYDROGENASE: LDH: 223 U/L (ref 125–245)

## 2017-08-04 NOTE — Assessment & Plan Note (Signed)
This is likely due to recent treatment. The patient denies recent history of bleeding such as epistaxis, hematuria or hematochezia. She is asymptomatic from the low platelet count. I will observe for now.  she does not require transfusion now. I will continue the chemotherapy at current dose without dosage adjustment.  If the thrombocytopenia gets progressive worse in the future, I might have to delay her treatment or adjust the chemotherapy dose.   

## 2017-08-04 NOTE — Assessment & Plan Note (Signed)
Leg edema is improving. Continue observation for now. I do not recommend aggressive diuretic therapy due to risk of dehydration and renal failure

## 2017-08-04 NOTE — Assessment & Plan Note (Signed)
We will proceed with treatment without dose adjustment. The patient is advised neutropenic precaution I recommend she delay dental cleaning until after the next evaluation and resolution of neutropenia

## 2017-08-04 NOTE — Progress Notes (Signed)
[facility] OFFICE PROGRESS NOTE  Patient Care Team: Kelton Pillar, MD as PCP - General (Family Medicine)  SUMMARY OF ONCOLOGIC HISTORY:   CLL (chronic lymphocytic leukemia) ([place])   11/29/2001 Pathology Results    [phone] Left neck biopsy: LEFT NECK LYMPH NODE, EXCISION: SMALL LYMPHOCYTIC LYMPHOMA/CHRONIC LYMPHOCYTIC LEUKEMIA, SEE COMMENT  COMMENT Sections show effacement of the lymph node architecture by uniform small lymphoid cells displaying chromatin clumping and small to inconspicuous nucleoli. This is associated with numerous proliferation centers (pseudofollicular centers). There is no evidence of high grade transformation. Flow cytometric analysis showed a monoclonal, lambda restricted B cell population expressing pan B cell antigens including CD20 and CD23 with coexpression of CD5 and CD20. 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      01/04/2002 Bone Marrow Biopsy    BONE MARROW, TOUCH IMPRINTS AND CORE BIOPSY: INVOLVEMENT BY SMALL LYMPHOCYTIC LYMPHOMA/CHRONIC LYMPHOCYTIC LEUKEMIA      01/06/2004 Miscellaneous    She was treated for the CLL with Rituxan in 2005/ 2006 and again Jan 2008 thru Feb 2009,with good response, butheld then due to complaints of transient short term memory loss after each RItuxan treatment      10/31/2016 PET scan    Diffuse cervical, axillary, retroperitoneal, and pelvic adenopathy is noted which exhibits very mild increased FDG uptake. I suspect the mild level of increased FDG uptake reflects the inherent hypometabolic state of the tumor as opposed to lack of viable tumor.  2. Brown fat within bilateral supraclavicular and paraspinal fat.      11/12/2016 Imaging    Ct scan showed significant progression of bulky confluent retroperitoneal and bilateral pelvic lymphadenopathy. Mild progression of thoracic lymphadenopathy involving the bilateral axillary, mediastinal and bilateral  hilar chains. 2. New extensive fine perilymphatic distribution nodularity and interlobular septal thickening throughout both lungs, most consistent with lymphangitic tumor, as can be seen in lymphoproliferative disorders. 3. Normal size spleen. 4. Additional findings include aortic atherosclerosis, stable 2.0 cm inferior thyroid isthmus nodule mild sigmoid diverticulosis, mild superior L1 vertebral compression fracture of indeterminate chronicity (new since 03/06/2013), and increased size of moderate midline high ventral abdominal wall hernia containing fat and fluid.      12/31/2016 Pathology Results    Lymph node for lymphoma, left supraclavicular - SMALL LYMPHOCYTIC LYMPHOMA. - SEE ONCOLOGY TABLE. Microscopic Comment LYMPHOMA Histologic type: Non-Hodgkin lymphoma, small lymphocytic type. Grade (if applicable): Low grade. Flow cytometry: Monoclonal, lambda restricted B-cell population expressing pan B-cell antigens including CD20 associated with dim CD5 expression. No CD10 expression is identified ([id number]). Immunohistochemical stains: CD10, CD20, CD3, CD43, CD5, CD79a, and cyclin D1 with appropriate controls. Touch preps/imprints: Predominance of small lymphoid cells with high nuclear cytoplasmic ratio, coarse chromatin and small to inconspicuous nucleoli admixed with larger lymphoid cells to a much lesser extent. Comments: The sections show lymph nodal tissue displaying effacement of the architecture by a lymphoproliferative process characterized by predominance of small lymphoid cells with high nuclear cytoplasmic ratio, round to slightly irregular nuclei, coarse chromatin and small to inconspicuous nucleoli. This is associated with abundant variably sized proliferation centers (pseudofollicular centers) imparting a vaguely nodular pattern. Flow cytometric analysis shows a monoclonal, lambda restricted B-cell population expressing pan B-cell antigens associated with dim CD5 expression. In addition,  immunohistochemical stains were performed and show that the lymphoid cells are predominantly composed of B cells as seen with CD20 and CD79a associated with co-expression with CD43 and weak co-expression with CD5. No significant CD10 or cyclin D1 positivity  is identified. There is admixed T-cell population to a lesser extent as primarily seen with CD3. The findings are consistent with involvement by small lymphocytic lymphoma/chronic lymphocytic leukemia. Clinical correlation is recommended. (BNS:ecj 01/04/2017)      12/31/2016 Surgery    She had excisional biopsy left supraclavicular lymph nodes      02/14/2017 - 05/03/2017 Chemotherapy    The patient had treatment with Gazyva. She has delay in starting treatment due to inability to get insurance approval for ibrutinib.      02/21/2017 Adverse Reaction    Treatment was delayed by 1 week due to pancytopenia      05/30/2017 PET scan    The index left level 5 cervical lymph node measuring 2 cm on today's study previously measured 1.3 cm. The index right level 4 cervical node measures 1.6 cm is on today's study, previously 1.4 cm. The index right axillary node measures 1.3 cm on today's study. Previously 1.1 cm. The index right paratracheal node measures 1.4 cm on the current study. Previously 1.1 cm. Within the abdomen the large retroperitoneal nodal mass measures 9.2 by 4.6 cm. This is compared with 8.7 x 4.8 cm previously. Within the right external iliac lymph node chain there is a 1.3 cm lymph node on today's study. Previously this measured 1 cm. Left external iliac lymph node measures 1.2 cm on today's study. Previously 1 cm.  IMPRESSION: 1. When compared with 02/19/2017 the index lymph nodes within the neck, chest, abdomen and pelvis demonstrate mild increase in size in the interval.      06/17/2017 -  Chemotherapy    She started taking Ibrutinib       INTERVAL HISTORY: Please see below for problem oriented charting. She returns for further  follow-up Some of the lymphadenopathy had resolved Her leg swelling is improving Appetite is stable She had mild intermittent diarrhea She denies sensation of palpitation or irregular heartbeat The patient denies any recent signs or symptoms of bleeding such as spontaneous epistaxis, hematuria or hematochezia.  REVIEW OF SYSTEMS:   Constitutional: Denies fevers, chills or abnormal weight loss Eyes: Denies blurriness of vision Ears, nose, mouth, throat, and face: Denies mucositis or sore throat Respiratory: Denies cough, dyspnea or wheezes Cardiovascular: Denies palpitation, chest discomfort  Skin: Denies abnormal skin rashes Lymphatics: Denies new lymphadenopathy or easy bruising Neurological:Denies numbness, tingling or new weaknesses Behavioral/Psych: Mood is stable, no new changes  All other systems were reviewed with the patient and are negative.  I have reviewed the past medical history, past surgical history, social history and family history with the patient and they are unchanged from previous note.  ALLERGIES:  is allergic to gluten meal; influenza virus vacc split pf; nutrasweet aspartame [aspartame]; salicylates cross reactors; and sulfa antibiotics.  MEDICATIONS:  Current Outpatient Prescriptions  Medication Sig Dispense Refill  . Ibrutinib 420 MG TABS Take 420 mg by mouth daily.    Marland Kitchen acyclovir (ZOVIRAX) 200 MG capsule Take 200 mg by mouth daily. Patient not sure of dosage amount    . Calcium Carbonate-Vitamin D (CALCIUM 600+D) 600-200 MG-UNIT TABS Take 1 tablet by mouth 2 (two) times daily.    Marland Kitchen lidocaine-prilocaine (EMLA) cream Apply 1 application topically as needed. 30 g 6  . metoprolol tartrate (LOPRESSOR) 25 MG tablet Take 0.5 tablets (12.5 mg total) by mouth 2 (two) times daily. 30 tablet 1  . Multiple Vitamin (MULTIVITAMIN WITH MINERALS) TABS tablet Take 1 tablet by mouth daily.    . Omega-3 Fatty Acids (FISH  OIL) 1000 MG CAPS Take by mouth daily.    .  ondansetron (ZOFRAN ODT) 4 MG disintegrating tablet '4mg'$  ODT q6 hours prn nausea/vomit 8 tablet 0  . PAZEO 0.7 % SOLN Place 1 drop into both eyes daily.  4  . prednisoLONE acetate (PRED FORTE) 1 % ophthalmic suspension Place 1 drop into the right eye 2 (two) times daily.  4   No current facility-administered medications for this visit.     PHYSICAL EXAMINATION: ECOG PERFORMANCE STATUS: 1 - Symptomatic but completely ambulatory  Vitals:   08/04/17 0915  BP: (!) 133/53  Pulse: 64  Resp: 18  Temp: 98.3 F (36.8 C)  SpO2: 100%   Filed Weights   08/04/17 0915  Weight: 126 lb 3.2 oz (57.2 kg)    GENERAL:alert, no distress and comfortable SKIN: skin color, texture, turgor are normal, no rashes or significant lesions EYES: normal, Conjunctiva are pink and non-injected, sclera clear OROPHARYNX:no exudate, no erythema and lips, buccal mucosa, and tongue normal  NECK: supple, thyroid normal size, non-tender, without nodularity LYMPH:  no palpable lymphadenopathy in the cervical, axillary or inguinal LUNGS: clear to auscultation and percussion with normal breathing effort HEART: regular rate & rhythm and no murmurs noted mild to moderate bilateral lower extremity edema ABDOMEN:abdomen soft, non-tender and normal bowel sounds Musculoskeletal:no cyanosis of digits and no clubbing  NEURO: alert & oriented x 3 with fluent speech, no focal motor/sensory deficits  LABORATORY DATA:  I have reviewed the data as listed    Component Value Date/Time   NA 143 08/04/2017 0900   K 4.8 08/04/2017 0900   CL 99 (L) 02/19/2017 0951   CL 106 03/21/2013 1302   CO2 29 08/04/2017 0900   GLUCOSE 85 08/04/2017 0900   GLUCOSE 87 03/21/2013 1302   BUN 13.9 08/04/2017 0900   CREATININE 0.7 08/04/2017 0900   CALCIUM 9.2 08/04/2017 0900   PROT 5.8 (L) 08/04/2017 0900   ALBUMIN 3.7 08/04/2017 0900   AST 57 (H) 08/04/2017 0900   ALT 36 08/04/2017 0900   ALKPHOS 98 08/04/2017 0900   BILITOT 0.57 08/04/2017  0900   GFRNONAA >60 02/19/2017 0951   GFRAA >60 02/19/2017 0951    No results found for: SPEP, UPEP  Lab Results  Component Value Date   WBC 4.8 08/04/2017   NEUTROABS 0.9 (L) 08/04/2017   HGB 13.3 08/04/2017   HCT 41.1 08/04/2017   MCV 94.5 08/04/2017   PLT 116 (L) 08/04/2017      Chemistry      Component Value Date/Time   NA 143 08/04/2017 0900   K 4.8 08/04/2017 0900   CL 99 (L) 02/19/2017 0951   CL 106 03/21/2013 1302   CO2 29 08/04/2017 0900   BUN 13.9 08/04/2017 0900   CREATININE 0.7 08/04/2017 0900      Component Value Date/Time   CALCIUM 9.2 08/04/2017 0900   ALKPHOS 98 08/04/2017 0900   AST 57 (H) 08/04/2017 0900   ALT 36 08/04/2017 0900   BILITOT 0.57 08/04/2017 0900     ASSESSMENT & PLAN:  CLL (chronic lymphocytic leukemia) ([facility]) She tolerated ibrutinib well Lymphocytosis is improving Thrombocytopenia is stable I recommend we continue the same treatment without dose adjustment I will see her back in 6 weeks plan to repeat imaging study after resolution of lymphocytosis  Thrombocytopenia ([place]) This is likely due to recent treatment. The patient denies recent history of bleeding such as epistaxis, hematuria or hematochezia. She is asymptomatic from the low platelet count.  I will observe for now.  she does not require transfusion now. I will continue the chemotherapy at current dose without dosage adjustment.  If the thrombocytopenia gets progressive worse in the future, I might have to delay her treatment or adjust the chemotherapy dose.    Drug-induced neutropenia ([facility]) We will proceed with treatment without dose adjustment. The patient is advised neutropenic precaution I recommend she delay dental cleaning until after the next evaluation and resolution of neutropenia  Bilateral leg edema Leg edema is improving. Continue observation for now. I do not recommend aggressive diuretic therapy due to risk of dehydration and renal failure   No orders of  the defined types were placed in this encounter.  All questions were answered. The patient knows to call the clinic with any problems, questions or concerns. No barriers to learning was detected. I spent 15 minutes counseling the patient face to face. The total time spent in the appointment was 20 minutes and more than 50% was on counseling and review of test results     Heath Lark, MD 08/04/2017 10:29 AM

## 2017-08-04 NOTE — Assessment & Plan Note (Signed)
She tolerated ibrutinib well Lymphocytosis is improving Thrombocytopenia is stable I recommend we continue the same treatment without dose adjustment I will see her back in 6 weeks plan to repeat imaging study after resolution of lymphocytosis

## 2017-08-04 NOTE — Telephone Encounter (Signed)
Gave patient avs and calendar for sept.

## 2017-08-25 ENCOUNTER — Telehealth: Payer: Self-pay

## 2017-08-25 NOTE — Telephone Encounter (Signed)
Pt is on imbruvica and on metoprolol 1/2 pill bid. Last night had heart palpitations, couldn't sleep for 2-3 hours. She has had palpitations in the past, had EKG at [facility] once. They are gone away. She did take metoprolol this AM. Legs are swelling during the day, go down at night. Some SOB at times, a little now, did have SOB with palpitations.   S/w Dr Alvy Bimler. Instructed pt to hold Imbruvica for this weekend. Resume on Tuesday. She spoke back instructions.

## 2017-08-28 ENCOUNTER — Other Ambulatory Visit: Payer: Self-pay | Admitting: Hematology and Oncology

## 2017-08-30 ENCOUNTER — Telehealth: Payer: Self-pay | Admitting: *Deleted

## 2017-08-30 NOTE — Telephone Encounter (Signed)
Pt called to report she restarted inbruvica today. No further problems with heart palpitations.

## 2017-08-31 ENCOUNTER — Other Ambulatory Visit: Payer: Self-pay | Admitting: Hematology and Oncology

## 2017-08-31 ENCOUNTER — Telehealth: Payer: Self-pay

## 2017-08-31 ENCOUNTER — Telehealth: Payer: Self-pay | Admitting: *Deleted

## 2017-08-31 DIAGNOSIS — C9110 Chronic lymphocytic leukemia of B-cell type not having achieved remission: Secondary | ICD-10-CM

## 2017-08-31 NOTE — Telephone Encounter (Signed)
Pt called that she had been off imbruvica for 5 days and restarted yesterday. She got bad heart palpitations in the night. She did not take this mornings dose and is awaiting instruction from Dr Alvy Bimler. She still has 14 days left in this cycle.  Next lab /MD 9/20

## 2017-08-31 NOTE — Telephone Encounter (Signed)
Pt notified to stop imbruvica. Will have PET ~ 9/19 and then see Dr Alvy Bimler to discuss Verbalized understanding

## 2017-08-31 NOTE — Telephone Encounter (Signed)
Stop treatment I am ordering PET scan to be done on 9/19 before I see her and make a decision then

## 2017-09-05 ENCOUNTER — Other Ambulatory Visit: Payer: Self-pay | Admitting: Hematology and Oncology

## 2017-09-05 DIAGNOSIS — I499 Cardiac arrhythmia, unspecified: Secondary | ICD-10-CM

## 2017-09-10 ENCOUNTER — Encounter (HOSPITAL_COMMUNITY): Payer: Medicare Other

## 2017-09-12 ENCOUNTER — Ambulatory Visit (HOSPITAL_COMMUNITY)
Admission: RE | Admit: 2017-09-12 | Discharge: 2017-09-12 | Disposition: A | Discharge status: Home / Self Care | Payer: Medicare Other | Source: Ambulatory Visit | Attending: Hematology and Oncology | Admitting: Hematology and Oncology

## 2017-09-12 ENCOUNTER — Encounter: Payer: Self-pay | Admitting: Hematology and Oncology

## 2017-09-12 ENCOUNTER — Telehealth: Payer: Self-pay

## 2017-09-12 ENCOUNTER — Ambulatory Visit (HOSPITAL_BASED_OUTPATIENT_CLINIC_OR_DEPARTMENT_OTHER): Payer: Medicare Other

## 2017-09-12 ENCOUNTER — Ambulatory Visit (HOSPITAL_BASED_OUTPATIENT_CLINIC_OR_DEPARTMENT_OTHER): Payer: Medicare Other | Admitting: Hematology and Oncology

## 2017-09-12 DIAGNOSIS — R05 Cough: Secondary | ICD-10-CM | POA: Diagnosis not present

## 2017-09-12 DIAGNOSIS — J019 Acute sinusitis, unspecified: Secondary | ICD-10-CM | POA: Insufficient documentation

## 2017-09-12 DIAGNOSIS — D702 Other drug-induced agranulocytosis: Secondary | ICD-10-CM | POA: Diagnosis not present

## 2017-09-12 DIAGNOSIS — D61818 Other pancytopenia: Secondary | ICD-10-CM

## 2017-09-12 DIAGNOSIS — C9110 Chronic lymphocytic leukemia of B-cell type not having achieved remission: Secondary | ICD-10-CM

## 2017-09-12 DIAGNOSIS — J018 Other acute sinusitis: Secondary | ICD-10-CM

## 2017-09-12 DIAGNOSIS — D61810 Antineoplastic chemotherapy induced pancytopenia: Secondary | ICD-10-CM | POA: Diagnosis not present

## 2017-09-12 DIAGNOSIS — D72820 Lymphocytosis (symptomatic): Secondary | ICD-10-CM

## 2017-09-12 LAB — CBC WITH DIFFERENTIAL/PLATELET
BASO%: 2.7 % — AB (ref 0.0–2.0)
Basophils Absolute: 0.1 10*3/uL (ref 0.0–0.1)
EOS ABS: 0.2 10*3/uL (ref 0.0–0.5)
EOS%: 6.1 % (ref 0.0–7.0)
HEMATOCRIT: 44.2 % (ref 34.8–46.6)
HGB: 14.5 g/dL (ref 11.6–15.9)
LYMPH#: 1.5 10*3/uL (ref 0.9–3.3)
LYMPH%: 52.2 % — ABNORMAL HIGH (ref 14.0–49.7)
MCH: 30.7 pg (ref 25.1–34.0)
MCHC: 32.8 g/dL (ref 31.5–36.0)
MCV: 93.4 fL (ref 79.5–101.0)
MONO#: 0.4 10*3/uL (ref 0.1–0.9)
MONO%: 14.7 % — ABNORMAL HIGH (ref 0.0–14.0)
NEUT%: 24.3 % — AB (ref 38.4–76.8)
NEUTROS ABS: 0.7 10*3/uL — AB (ref 1.5–6.5)
PLATELETS: 136 10*3/uL — AB (ref 145–400)
RBC: 4.73 10*6/uL (ref 3.70–5.45)
RDW: 14.1 % (ref 11.2–14.5)
WBC: 2.9 10*3/uL — ABNORMAL LOW (ref 3.9–10.3)

## 2017-09-12 LAB — LACTATE DEHYDROGENASE: LDH: 236 U/L (ref 125–245)

## 2017-09-12 LAB — COMPREHENSIVE METABOLIC PANEL
ALK PHOS: 113 U/L (ref 40–150)
ALT: 40 U/L (ref 0–55)
AST: 51 U/L — ABNORMAL HIGH (ref 5–34)
Albumin: 4.1 g/dL (ref 3.5–5.0)
Anion Gap: 9 mEq/L (ref 3–11)
BILIRUBIN TOTAL: 0.58 mg/dL (ref 0.20–1.20)
BUN: 10.2 mg/dL (ref 7.0–26.0)
CALCIUM: 9.7 mg/dL (ref 8.4–10.4)
CO2: 28 mEq/L (ref 22–29)
CREATININE: 0.7 mg/dL (ref 0.6–1.1)
Chloride: 103 mEq/L (ref 98–109)
EGFR: >90 mL/min/{1.73_m2} (ref >90)
Glucose: 89 mg/dl (ref 70–140)
Potassium: 4.2 mEq/L (ref 3.5–5.1)
Sodium: 140 mEq/L (ref 136–145)
TOTAL PROTEIN: 6.5 g/dL (ref 6.4–8.3)

## 2017-09-12 MED ORDER — LEVOFLOXACIN 500 MG PO TABS: 500.0000 mg | ORAL_TABLET | Freq: Every day | ORAL | 0 refills | Status: DC

## 2017-09-12 MED ORDER — GUAIFENESIN-CODEINE 100-10 MG/5ML PO SOLN: 5.0000 mL | Freq: Three times a day (TID) | ORAL | 0 refills | Status: AC | PRN

## 2017-09-12 NOTE — Progress Notes (Signed)
[place] OFFICE PROGRESS NOTE  Patient Care Team: Kelton Pillar, MD as PCP - General (Family Medicine)  SUMMARY OF ONCOLOGIC HISTORY:   CLL (chronic lymphocytic leukemia) ([place])   11/29/2001 Pathology Results    [phone] Left neck biopsy: LEFT NECK LYMPH NODE, EXCISION: SMALL LYMPHOCYTIC LYMPHOMA/CHRONIC LYMPHOCYTIC LEUKEMIA, SEE COMMENT  COMMENT Sections show effacement of the lymph node architecture by uniform small lymphoid cells displaying chromatin clumping and small to inconspicuous nucleoli. This is associated with numerous proliferation centers (pseudofollicular centers). There is no evidence of high grade transformation. Flow cytometric analysis showed a monoclonal, lambda restricted B cell population expressing pan B cell antigens including CD20 and CD23 with coexpression of CD5 and CD20. 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      01/04/2002 Bone Marrow Biopsy    BONE MARROW, TOUCH IMPRINTS AND CORE BIOPSY: INVOLVEMENT BY SMALL LYMPHOCYTIC LYMPHOMA/CHRONIC LYMPHOCYTIC LEUKEMIA      01/06/2004 Miscellaneous    She was treated for the CLL with Rituxan in 2005/ 2006 and again Jan 2008 thru Feb 2009,with good response, butheld then due to complaints of transient short term memory loss after each RItuxan treatment      10/31/2016 PET scan    Diffuse cervical, axillary, retroperitoneal, and pelvic adenopathy is noted which exhibits very mild increased FDG uptake. I suspect the mild level of increased FDG uptake reflects the inherent hypometabolic state of the tumor as opposed to lack of viable tumor.  2. Brown fat within bilateral supraclavicular and paraspinal fat.      11/12/2016 Imaging    Ct scan showed significant progression of bulky confluent retroperitoneal and bilateral pelvic lymphadenopathy. Mild progression of thoracic lymphadenopathy involving the bilateral axillary, mediastinal and bilateral  hilar chains. 2. New extensive fine perilymphatic distribution nodularity and interlobular septal thickening throughout both lungs, most consistent with lymphangitic tumor, as can be seen in lymphoproliferative disorders. 3. Normal size spleen. 4. Additional findings include aortic atherosclerosis, stable 2.0 cm inferior thyroid isthmus nodule mild sigmoid diverticulosis, mild superior L1 vertebral compression fracture of indeterminate chronicity (new since 03/06/2013), and increased size of moderate midline high ventral abdominal wall hernia containing fat and fluid.      12/31/2016 Pathology Results    Lymph node for lymphoma, left supraclavicular - SMALL LYMPHOCYTIC LYMPHOMA. - SEE ONCOLOGY TABLE. Microscopic Comment LYMPHOMA Histologic type: Non-Hodgkin lymphoma, small lymphocytic type. Grade (if applicable): Low grade. Flow cytometry: Monoclonal, lambda restricted B-cell population expressing pan B-cell antigens including CD20 associated with dim CD5 expression. No CD10 expression is identified ([id number]). Immunohistochemical stains: CD10, CD20, CD3, CD43, CD5, CD79a, and cyclin D1 with appropriate controls. Touch preps/imprints: Predominance of small lymphoid cells with high nuclear cytoplasmic ratio, coarse chromatin and small to inconspicuous nucleoli admixed with larger lymphoid cells to a much lesser extent. Comments: The sections show lymph nodal tissue displaying effacement of the architecture by a lymphoproliferative process characterized by predominance of small lymphoid cells with high nuclear cytoplasmic ratio, round to slightly irregular nuclei, coarse chromatin and small to inconspicuous nucleoli. This is associated with abundant variably sized proliferation centers (pseudofollicular centers) imparting a vaguely nodular pattern. Flow cytometric analysis shows a monoclonal, lambda restricted B-cell population expressing pan B-cell antigens associated with dim CD5 expression. In addition,  immunohistochemical stains were performed and show that the lymphoid cells are predominantly composed of B cells as seen with CD20 and CD79a associated with co-expression with CD43 and weak co-expression with CD5. No significant CD10 or cyclin D1 positivity  is identified. There is admixed T-cell population to a lesser extent as primarily seen with CD3. The findings are consistent with involvement by small lymphocytic lymphoma/chronic lymphocytic leukemia. Clinical correlation is recommended. (BNS:ecj 01/04/2017)      12/31/2016 Surgery    She had excisional biopsy left supraclavicular lymph nodes      02/14/2017 - 05/03/2017 Chemotherapy    The patient had treatment with Gazyva. She has delay in starting treatment due to inability to get insurance approval for ibrutinib.      02/21/2017 Adverse Reaction    Treatment was delayed by 1 week due to pancytopenia      05/30/2017 PET scan    The index left level 5 cervical lymph node measuring 2 cm on today's study previously measured 1.3 cm. The index right level 4 cervical node measures 1.6 cm is on today's study, previously 1.4 cm. The index right axillary node measures 1.3 cm on today's study. Previously 1.1 cm. The index right paratracheal node measures 1.4 cm on the current study. Previously 1.1 cm. Within the abdomen the large retroperitoneal nodal mass measures 9.2 by 4.6 cm. This is compared with 8.7 x 4.8 cm previously. Within the right external iliac lymph node chain there is a 1.3 cm lymph node on today's study. Previously this measured 1 cm. Left external iliac lymph node measures 1.2 cm on today's study. Previously 1 cm.  IMPRESSION: 1. When compared with 02/19/2017 the index lymph nodes within the neck, chest, abdomen and pelvis demonstrate mild increase in size in the interval.      06/17/2017 -  Chemotherapy    She started taking Ibrutinib       INTERVAL HISTORY: Please see below for problem oriented charting. She returns for further  follow-up She has nasal drainage and recent slight throat She has significant mild productive cough No recent fever or chills  REVIEW OF SYSTEMS:   Constitutional: Denies fevers, chills or abnormal weight loss Eyes: Denies blurriness of vision Ears, nose, mouth, throat, and face: Denies mucositis or sore throat Cardiovascular: Denies palpitation, chest discomfort or lower extremity swelling Gastrointestinal:  Denies nausea, heartburn or change in bowel habits Skin: Denies abnormal skin rashes Lymphatics: Denies new lymphadenopathy or easy bruising Neurological:Denies numbness, tingling or new weaknesses Behavioral/Psych: Mood is stable, no new changes  All other systems were reviewed with the patient and are negative.  I have reviewed the past medical history, past surgical history, social history and family history with the patient and they are unchanged from previous note.  ALLERGIES:  is allergic to gluten meal; influenza virus vacc split pf; nutrasweet aspartame [aspartame]; salicylates cross reactors; and sulfa antibiotics.  MEDICATIONS:  Current Outpatient Prescriptions  Medication Sig Dispense Refill  . acyclovir (ZOVIRAX) 200 MG capsule Take 200 mg by mouth daily. Patient not sure of dosage amount    . acyclovir (ZOVIRAX) 400 MG tablet TAKE 1 TABLET (400 MG TOTAL) BY MOUTH DAILY. 30 tablet 6  . Calcium Carbonate-Vitamin D (CALCIUM 600+D) 600-200 MG-UNIT TABS Take 1 tablet by mouth 2 (two) times daily.    Marland Kitchen guaiFENesin-codeine 100-10 MG/5ML syrup Take 5 mLs by mouth 3 (three) times daily as needed for cough. 473 mL 0  . Ibrutinib 420 MG TABS Take 420 mg by mouth daily.    Marland Kitchen levofloxacin (LEVAQUIN) 500 MG tablet Take 1 tablet (500 mg total) by mouth daily. 7 tablet 0  . lidocaine-prilocaine (EMLA) cream Apply 1 application topically as needed. 30 g 6  . metoprolol tartrate (  LOPRESSOR) 25 MG tablet TAKE 0.5 TABLETS (12.5 MG TOTAL) BY MOUTH 2 (TWO) TIMES DAILY. 30 tablet 1  .  Multiple Vitamin (MULTIVITAMIN WITH MINERALS) TABS tablet Take 1 tablet by mouth daily.    . Omega-3 Fatty Acids (FISH OIL) 1000 MG CAPS Take by mouth daily.    . ondansetron (ZOFRAN ODT) 4 MG disintegrating tablet 41m ODT q6 hours prn nausea/vomit 8 tablet 0  . PAZEO 0.7 % SOLN Place 1 drop into both eyes daily.  4  . prednisoLONE acetate (PRED FORTE) 1 % ophthalmic suspension Place 1 drop into the right eye 2 (two) times daily.  4   No current facility-administered medications for this visit.     PHYSICAL EXAMINATION: ECOG PERFORMANCE STATUS: 1 - Symptomatic but completely ambulatory  Vitals:   09/12/17 1232  BP: (!) 146/60  Pulse: 81  Resp: 17  Temp: 98.2 F (36.8 C)  SpO2: 100%   Filed Weights   09/12/17 1232  Weight: 128 lb 3.2 oz (58.2 kg)    GENERAL:alert, no distress and comfortable SKIN: skin color, texture, turgor are normal, no rashes or significant lesions EYES: normal, Conjunctiva are pink and non-injected, sclera clear OROPHARYNX:no exudate, no erythema and lips, buccal mucosa, and tongue normal  NECK: supple, thyroid normal size, non-tender, without nodularity LYMPH: She has persistent palpable lymphadenopathy in the left supraclavicular region LUNGS: clear to auscultation and percussion with normal breathing effort HEART: regular rate & rhythm and no murmurs and no lower extremity edema ABDOMEN:abdomen soft, non-tender and normal bowel sounds Musculoskeletal:no cyanosis of digits and no clubbing  NEURO: alert & oriented x 3 with fluent speech, no focal motor/sensory deficits  LABORATORY DATA:  I have reviewed the data as listed    Component Value Date/Time   NA 140 09/12/2017 1251   K 4.2 09/12/2017 1251   CL 99 (L) 02/19/2017 0951   CL 106 03/21/2013 1302   CO2 28 09/12/2017 1251   GLUCOSE 89 09/12/2017 1251   GLUCOSE 87 03/21/2013 1302   BUN 10.2 09/12/2017 1251   CREATININE 0.7 09/12/2017 1251   CALCIUM 9.7 09/12/2017 1251   PROT 6.5 09/12/2017  1251   ALBUMIN 4.1 09/12/2017 1251   AST 51 (H) 09/12/2017 1251   ALT 40 09/12/2017 1251   ALKPHOS 113 09/12/2017 1251   BILITOT 0.58 09/12/2017 1251   GFRNONAA >60 02/19/2017 0951   GFRAA >60 02/19/2017 0951    No results found for: SPEP, UPEP  Lab Results  Component Value Date   WBC 2.9 (L) 09/12/2017   NEUTROABS 0.7 (L) 09/12/2017   HGB 14.5 09/12/2017   HCT 44.2 09/12/2017   MCV 93.4 09/12/2017   PLT 136 (L) 09/12/2017      Chemistry      Component Value Date/Time   NA 140 09/12/2017 1251   K 4.2 09/12/2017 1251   CL 99 (L) 02/19/2017 0951   CL 106 03/21/2013 1302   CO2 28 09/12/2017 1251   BUN 10.2 09/12/2017 1251   CREATININE 0.7 09/12/2017 1251      Component Value Date/Time   CALCIUM 9.7 09/12/2017 1251   ALKPHOS 113 09/12/2017 1251   AST 51 (H) 09/12/2017 1251   ALT 40 09/12/2017 1251   BILITOT 0.58 09/12/2017 1251       RADIOGRAPHIC STUDIES: I have personally reviewed the radiological images as listed and agreed with the findings in the report. Dg Chest 2 View  Result Date: 09/12/2017 CLINICAL DATA:  Sinus affection 9/13. Cough spasms. Denies  headaches/sob. Hx asthma EXAM: CHEST  2 VIEW COMPARISON:  CT chest 02/19/2017, PET-CT 05/30/2017 FINDINGS: Normal cardiac silhouette with ectatic aorta. Mild fine peripheral interstitial markings. No effusion, infiltrate pneumothorax. IMPRESSION: Mild interstitial edema pattern.  No infiltrate. Electronically Signed   By: Suzy Bouchard M.D.   On: 09/12/2017 13:24    ASSESSMENT & PLAN:  CLL (chronic lymphocytic leukemia) ([place]) She tolerated ibrutinib poorly due to intermittent palpitations Lymphocytosis is improving She is scheduled for PET CT scan in 2 days and I will see her afterwards  Pancytopenia, acquired ([place]) She has acquired pancytopenia due to treatment Her treatment is placed on hold Observe only for now  Sinusitis, acute She has unresolved sinusitis and cough She is neutropenic I recommend  levofloxacin Chest x-ray show no evidence of pneumonia I also gave her prescription cough syrup to take as needed I plan to see her back in 3 days to reassess   Orders Placed This Encounter  Procedures  . DG Chest 2 View    Standing Status:   Future    Number of Occurrences:   1    Standing Expiration Date:   10/17/2018    Order Specific Question:   Reason for exam:    Answer:   cough, exclude pneumonia    Order Specific Question:   Preferred imaging location?    Answer:   [facility]   All questions were answered. The patient knows to call the clinic with any problems, questions or concerns. No barriers to learning was detected. I spent 15 minutes counseling the patient face to face. The total time spent in the appointment was 20 minutes and more than 50% was on counseling and review of test results     Heath Lark, MD 09/12/2017 3:52 PM

## 2017-09-12 NOTE — Telephone Encounter (Signed)
Scheduled appointment per [facility] request on 9/17. Patient was made aware of appointment change  By RN call.

## 2017-09-12 NOTE — Assessment & Plan Note (Signed)
She has acquired pancytopenia due to treatment Her treatment is placed on hold Observe only for now

## 2017-09-12 NOTE — Telephone Encounter (Signed)
Patient called and left message that she has had a cold since Friday. She has been taking Mucinex, she has a cough that kept her awake last night because she coughed so hard. Called patient back and gave her a 1200 noon appt for today per Dr. Alvy Bimler,

## 2017-09-12 NOTE — Telephone Encounter (Signed)
Called with below message. 

## 2017-09-12 NOTE — Telephone Encounter (Signed)
-----   Message from Heath Lark, MD sent at 09/12/2017  1:48 PM EDT ----- Regarding: CXR result Pls let her know CXR showed no signs of pneumonia Please take antibiotics as instructed ----- Message ----- From: Interface, Rad Results In Sent: 09/12/2017   1:26 PM To: Heath Lark, MD

## 2017-09-12 NOTE — Assessment & Plan Note (Signed)
She has unresolved sinusitis and cough She is neutropenic I recommend levofloxacin Chest x-ray show no evidence of pneumonia I also gave her prescription cough syrup to take as needed I plan to see her back in 3 days to reassess

## 2017-09-12 NOTE — Assessment & Plan Note (Signed)
She tolerated ibrutinib poorly due to intermittent palpitations Lymphocytosis is improving She is scheduled for PET CT scan in 2 days and I will see her afterwards

## 2017-09-14 ENCOUNTER — Encounter (HOSPITAL_COMMUNITY)
Admission: RE | Admit: 2017-09-14 | Discharge: 2017-09-14 | Disposition: A | Discharge status: Home / Self Care | Payer: Medicare Other | Source: Ambulatory Visit | Attending: Hematology and Oncology | Admitting: Hematology and Oncology

## 2017-09-14 DIAGNOSIS — C9190 Lymphoid leukemia, unspecified not having achieved remission: Secondary | ICD-10-CM | POA: Insufficient documentation

## 2017-09-14 DIAGNOSIS — C9110 Chronic lymphocytic leukemia of B-cell type not having achieved remission: Secondary | ICD-10-CM

## 2017-09-14 LAB — GLUCOSE, CAPILLARY: Glucose-Capillary: 97 mg/dL (ref 65–99)

## 2017-09-14 MED ORDER — FLUDEOXYGLUCOSE F - 18 (FDG) INJECTION: 6.4000 | Freq: Once | INTRAVENOUS | Status: DC | PRN

## 2017-09-14 MED ORDER — FLUDEOXYGLUCOSE F - 18 (FDG) INJECTION
6.4000 | Freq: Once | INTRAVENOUS | Status: AC | PRN
  Administered 2017-09-14: 6.4 via INTRAVENOUS

## 2017-09-15 ENCOUNTER — Encounter: Payer: Self-pay | Admitting: Hematology and Oncology

## 2017-09-15 ENCOUNTER — Telehealth: Payer: Self-pay | Admitting: Hematology and Oncology

## 2017-09-15 ENCOUNTER — Ambulatory Visit (HOSPITAL_BASED_OUTPATIENT_CLINIC_OR_DEPARTMENT_OTHER): Payer: Medicare Other | Admitting: Hematology and Oncology

## 2017-09-15 ENCOUNTER — Other Ambulatory Visit: Payer: Medicare Other

## 2017-09-15 DIAGNOSIS — C9110 Chronic lymphocytic leukemia of B-cell type not having achieved remission: Secondary | ICD-10-CM

## 2017-09-15 DIAGNOSIS — J019 Acute sinusitis, unspecified: Secondary | ICD-10-CM

## 2017-09-15 DIAGNOSIS — J018 Other acute sinusitis: Secondary | ICD-10-CM

## 2017-09-15 DIAGNOSIS — I499 Cardiac arrhythmia, unspecified: Secondary | ICD-10-CM | POA: Diagnosis not present

## 2017-09-15 NOTE — Progress Notes (Signed)
[facility] OFFICE PROGRESS NOTE  Patient Care Team: Kelton Pillar, MD as PCP - General (Family Medicine)  SUMMARY OF ONCOLOGIC HISTORY:   CLL (chronic lymphocytic leukemia) ([place])   11/29/2001 Pathology Results    [phone] Left neck biopsy: LEFT NECK LYMPH NODE, EXCISION: SMALL LYMPHOCYTIC LYMPHOMA/CHRONIC LYMPHOCYTIC LEUKEMIA, SEE COMMENT  COMMENT Sections show effacement of the lymph node architecture by uniform small lymphoid cells displaying chromatin clumping and small to inconspicuous nucleoli. This is associated with numerous proliferation centers (pseudofollicular centers). There is no evidence of high grade transformation. Flow cytometric analysis showed a monoclonal, lambda restricted B cell population expressing pan B cell antigens including CD20 and CD23 with coexpression of CD5 and CD20. 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      01/04/2002 Bone Marrow Biopsy    BONE MARROW, TOUCH IMPRINTS AND CORE BIOPSY: INVOLVEMENT BY SMALL LYMPHOCYTIC LYMPHOMA/CHRONIC LYMPHOCYTIC LEUKEMIA      01/06/2004 Miscellaneous    She was treated for the CLL with Rituxan in 2005/ 2006 and again Jan 2008 thru Feb 2009,with good response, butheld then due to complaints of transient short term memory loss after each RItuxan treatment      10/31/2016 PET scan    Diffuse cervical, axillary, retroperitoneal, and pelvic adenopathy is noted which exhibits very mild increased FDG uptake. I suspect the mild level of increased FDG uptake reflects the inherent hypometabolic state of the tumor as opposed to lack of viable tumor.  2. Brown fat within bilateral supraclavicular and paraspinal fat.      11/12/2016 Imaging    Ct scan showed significant progression of bulky confluent retroperitoneal and bilateral pelvic lymphadenopathy. Mild progression of thoracic lymphadenopathy involving the bilateral axillary, mediastinal and bilateral  hilar chains. 2. New extensive fine perilymphatic distribution nodularity and interlobular septal thickening throughout both lungs, most consistent with lymphangitic tumor, as can be seen in lymphoproliferative disorders. 3. Normal size spleen. 4. Additional findings include aortic atherosclerosis, stable 2.0 cm inferior thyroid isthmus nodule mild sigmoid diverticulosis, mild superior L1 vertebral compression fracture of indeterminate chronicity (new since 03/06/2013), and increased size of moderate midline high ventral abdominal wall hernia containing fat and fluid.      12/31/2016 Pathology Results    Lymph node for lymphoma, left supraclavicular - SMALL LYMPHOCYTIC LYMPHOMA. - SEE ONCOLOGY TABLE. Microscopic Comment LYMPHOMA Histologic type: Non-Hodgkin lymphoma, small lymphocytic type. Grade (if applicable): Low grade. Flow cytometry: Monoclonal, lambda restricted B-cell population expressing pan B-cell antigens including CD20 associated with dim CD5 expression. No CD10 expression is identified ([id number]). Immunohistochemical stains: CD10, CD20, CD3, CD43, CD5, CD79a, and cyclin D1 with appropriate controls. Touch preps/imprints: Predominance of small lymphoid cells with high nuclear cytoplasmic ratio, coarse chromatin and small to inconspicuous nucleoli admixed with larger lymphoid cells to a much lesser extent. Comments: The sections show lymph nodal tissue displaying effacement of the architecture by a lymphoproliferative process characterized by predominance of small lymphoid cells with high nuclear cytoplasmic ratio, round to slightly irregular nuclei, coarse chromatin and small to inconspicuous nucleoli. This is associated with abundant variably sized proliferation centers (pseudofollicular centers) imparting a vaguely nodular pattern. Flow cytometric analysis shows a monoclonal, lambda restricted B-cell population expressing pan B-cell antigens associated with dim CD5 expression. In addition,  immunohistochemical stains were performed and show that the lymphoid cells are predominantly composed of B cells as seen with CD20 and CD79a associated with co-expression with CD43 and weak co-expression with CD5. No significant CD10 or cyclin D1 positivity  is identified. There is admixed T-cell population to a lesser extent as primarily seen with CD3. The findings are consistent with involvement by small lymphocytic lymphoma/chronic lymphocytic leukemia. Clinical correlation is recommended. (BNS:ecj 01/04/2017)      12/31/2016 Surgery    She had excisional biopsy left supraclavicular lymph nodes      02/14/2017 - 05/03/2017 Chemotherapy    The patient had treatment with Gazyva. She has delay in starting treatment due to inability to get insurance approval for ibrutinib.      02/21/2017 Adverse Reaction    Treatment was delayed by 1 week due to pancytopenia      05/30/2017 PET scan    The index left level 5 cervical lymph node measuring 2 cm on today's study previously measured 1.3 cm. The index right level 4 cervical node measures 1.6 cm is on today's study, previously 1.4 cm. The index right axillary node measures 1.3 cm on today's study. Previously 1.1 cm. The index right paratracheal node measures 1.4 cm on the current study. Previously 1.1 cm. Within the abdomen the large retroperitoneal nodal mass measures 9.2 by 4.6 cm. This is compared with 8.7 x 4.8 cm previously. Within the right external iliac lymph node chain there is a 1.3 cm lymph node on today's study. Previously this measured 1 cm. Left external iliac lymph node measures 1.2 cm on today's study. Previously 1 cm.  IMPRESSION: 1. When compared with 02/19/2017 the index lymph nodes within the neck, chest, abdomen and pelvis demonstrate mild increase in size in the interval.      06/17/2017 -  Chemotherapy    She started taking Ibrutinib      09/14/2017 PET scan    1. Reduction in size of the adenopathy in the neck, chest, and  abdomen/pelvis. Generalized slight reduction in the SUV, currently Deauville 2 and Deauville 3. 2. Other imaging findings of potential clinical significance: Mild cardiomegaly. Sub xiphoid hernia containing adipose tissue and a small amount of fluid similar to prior. Aortic Atherosclerosis (ICD10-I70.0).       INTERVAL HISTORY: Please see below for problem oriented charting. She returns for further follow-up Sinusitis is improving Cough is improving She denies new lymphadenopathy No recent nausea, vomiting or diarrhea No recent fever or chills  REVIEW OF SYSTEMS:   Constitutional: Denies fevers, chills or abnormal weight loss Eyes: Denies blurriness of vision Ears, nose, mouth, throat, and face: Denies mucositis or sore throat Cardiovascular: Denies palpitation, chest discomfort or lower extremity swelling Gastrointestinal:  Denies nausea, heartburn or change in bowel habits Skin: Denies abnormal skin rashes Lymphatics: Denies new lymphadenopathy or easy bruising Neurological:Denies numbness, tingling or new weaknesses Behavioral/Psych: Mood is stable, no new changes  All other systems were reviewed with the patient and are negative.  I have reviewed the past medical history, past surgical history, social history and family history with the patient and they are unchanged from previous note.  ALLERGIES:  is allergic to gluten meal; influenza virus vacc split pf; nutrasweet aspartame [aspartame]; salicylates cross reactors; and sulfa antibiotics.  MEDICATIONS:  Current Outpatient Prescriptions  Medication Sig Dispense Refill  . acyclovir (ZOVIRAX) 200 MG capsule Take 200 mg by mouth daily. Patient not sure of dosage amount    . acyclovir (ZOVIRAX) 400 MG tablet TAKE 1 TABLET (400 MG TOTAL) BY MOUTH DAILY. 30 tablet 6  . Calcium Carbonate-Vitamin D (CALCIUM 600+D) 600-200 MG-UNIT TABS Take 1 tablet by mouth 2 (two) times daily.    Marland Kitchen guaiFENesin-codeine 100-10 MG/5ML syrup Take  5 mLs  by mouth 3 (three) times daily as needed for cough. 473 mL 0  . Ibrutinib 420 MG TABS Take 420 mg by mouth daily.    Marland Kitchen levofloxacin (LEVAQUIN) 500 MG tablet Take 1 tablet (500 mg total) by mouth daily. 7 tablet 0  . lidocaine-prilocaine (EMLA) cream Apply 1 application topically as needed. 30 g 6  . metoprolol tartrate (LOPRESSOR) 25 MG tablet TAKE 0.5 TABLETS (12.5 MG TOTAL) BY MOUTH 2 (TWO) TIMES DAILY. 30 tablet 1  . Multiple Vitamin (MULTIVITAMIN WITH MINERALS) TABS tablet Take 1 tablet by mouth daily.    . Omega-3 Fatty Acids (FISH OIL) 1000 MG CAPS Take by mouth daily.    . ondansetron (ZOFRAN ODT) 4 MG disintegrating tablet 51m ODT q6 hours prn nausea/vomit 8 tablet 0  . PAZEO 0.7 % SOLN Place 1 drop into both eyes daily.  4  . prednisoLONE acetate (PRED FORTE) 1 % ophthalmic suspension Place 1 drop into the right eye 2 (two) times daily.  4   No current facility-administered medications for this visit.    Facility-Administered Medications Ordered in Other Visits  Medication Dose Route Frequency Provider Last Rate Last Dose  . fludeoxyglucose F - 18 (FDG) injection 6.4 millicurie  6.4 millicurie Intravenous Once PRN TAbigail Miyamoto MD        PHYSICAL EXAMINATION: ECOG PERFORMANCE STATUS: 1 - Symptomatic but completely ambulatory  Vitals:   09/15/17 1015  BP: (!) 122/50  Pulse: 77  Resp: 19  Temp: 98.9 F (37.2 C)  SpO2: 100%   Filed Weights   09/15/17 1015  Weight: 128 lb (58.1 kg)    GENERAL:alert, no distress and comfortable SKIN: skin color, texture, turgor are normal, no rashes or significant lesions EYES: normal, Conjunctiva are pink and non-injected, sclera clear Musculoskeletal:no cyanosis of digits and no clubbing  NEURO: alert & oriented x 3 with fluent speech, no focal motor/sensory deficits  LABORATORY DATA:  I have reviewed the data as listed    Component Value Date/Time   NA 140 09/12/2017 1251   K 4.2 09/12/2017 1251   CL 99 (L) 02/19/2017 0951   CL  106 03/21/2013 1302   CO2 28 09/12/2017 1251   GLUCOSE 89 09/12/2017 1251   GLUCOSE 87 03/21/2013 1302   BUN 10.2 09/12/2017 1251   CREATININE 0.7 09/12/2017 1251   CALCIUM 9.7 09/12/2017 1251   PROT 6.5 09/12/2017 1251   ALBUMIN 4.1 09/12/2017 1251   AST 51 (H) 09/12/2017 1251   ALT 40 09/12/2017 1251   ALKPHOS 113 09/12/2017 1251   BILITOT 0.58 09/12/2017 1251   GFRNONAA >60 02/19/2017 0951   GFRAA >60 02/19/2017 0951    No results found for: SPEP, UPEP  Lab Results  Component Value Date   WBC 2.9 (L) 09/12/2017   NEUTROABS 0.7 (L) 09/12/2017   HGB 14.5 09/12/2017   HCT 44.2 09/12/2017   MCV 93.4 09/12/2017   PLT 136 (L) 09/12/2017      Chemistry      Component Value Date/Time   NA 140 09/12/2017 1251   K 4.2 09/12/2017 1251   CL 99 (L) 02/19/2017 0951   CL 106 03/21/2013 1302   CO2 28 09/12/2017 1251   BUN 10.2 09/12/2017 1251   CREATININE 0.7 09/12/2017 1251      Component Value Date/Time   CALCIUM 9.7 09/12/2017 1251   ALKPHOS 113 09/12/2017 1251   AST 51 (H) 09/12/2017 1251   ALT 40 09/12/2017 1251   BILITOT 0.58  09/12/2017 1251       RADIOGRAPHIC STUDIES: I have personally reviewed the radiological images as listed and agreed with the findings in the report. Dg Chest 2 View  Result Date: 09/12/2017 CLINICAL DATA:  Sinus affection 9/13. Cough spasms. Denies headaches/sob. Hx asthma EXAM: CHEST  2 VIEW COMPARISON:  CT chest 02/19/2017, PET-CT 05/30/2017 FINDINGS: Normal cardiac silhouette with ectatic aorta. Mild fine peripheral interstitial markings. No effusion, infiltrate pneumothorax. IMPRESSION: Mild interstitial edema pattern.  No infiltrate. Electronically Signed   By: Suzy Bouchard M.D.   On: 09/12/2017 13:24   Nm Pet Image Restag (ps) Skull Base To Thigh  Result Date: 09/14/2017 CLINICAL DATA:  Subsequent treatment strategy for chronic lymphocytic leukemia. EXAM: NUCLEAR MEDICINE PET SKULL BASE TO THIGH TECHNIQUE: 6.4 mCi F-18 FDG was  injected intravenously. Full-ring PET imaging was performed from the skull base to thigh after the radiotracer. CT data was obtained and used for attenuation correction and anatomic localization. FASTING BLOOD GLUCOSE:  Value: 97 mg/dl COMPARISON:  Multiple exams, including 05/30/2017 PET-CT FINDINGS: NECK Index level V lymph node conglomerate on image 39/4 measures 1.1 cm in short axis (formerly measured at 2.0 cm) with maximum SUV 1.6 (formerly the same.). The index right level IV lymph node shown on the prior exam is no longer seen is a structure separate from the right internal jugular vein. CHEST Index left axillary lymph node measures 0.6 cm in short axis on image 51/4 (formerly 1.1 cm) with maximum standard uptake value the 1.4 (formerly 1.9). The the index right axillary lymph node measures 0.8 cm in short axis on image 51/4 (formerly 1.3 cm) with maximum SUV 1.6 (formerly 1.9). The right lower paratracheal lymph node measures 1.3 cm in short axis on image 58/4 (formerly 1.4 cm) with maximum SUV 2.5 (formerly the same). Atherosclerotic calcification of the aortic arch. Mild biapical pleuroparenchymal scarring. Some mild interstitial accentuation in both lungs. Mild cardiomegaly. No definite worrisome pulmonary nodules. Background mediastinal blood pool activity level 2.1. ABDOMEN/PELVIS Index conglomerate nodal mass in the retroperitoneum potentially extending into the adjacent mesentery as a conglomerate size of 5.4 by 3.6 cm on image 122/4 (formerly 9.2 by 4.6 cm) and a maximum SUV of 2.5 (formerly 3.0). The index left external iliac node measures 1.0 cm in short axis on image 147/4 (formerly 1.2 cm) with maximum SUV 1.9 (formerly 2.0). The index right external iliac node measures 0.6 cm in short axis on image 142/4 (formerly 1.3 cm) with maximum standard uptake value 2.0 (formerly 2.1). No significant abnormal focal activity is observed in the liver, spleen, or pancreas. No splenomegaly. Physiologic bowel  activity noted. Sub xiphoid hernia containing adipose tissue and a small amount of fluid, similar to prior. Aortoiliac atherosclerotic vascular disease. Background hepatic activity level SUV 2.7. SKELETON No focal hypermetabolic activity to suggest skeletal metastasis. IMPRESSION: 1. Reduction in size of the adenopathy in the neck, chest, and abdomen/pelvis. Generalized slight reduction in the SUV, currently Deauville 2 and Deauville 3. 2. Other imaging findings of potential clinical significance: Mild cardiomegaly. Sub xiphoid hernia containing adipose tissue and a small amount of fluid similar to prior. Aortic Atherosclerosis (ICD10-I70.0). Electronically Signed   By: Van Clines M.D.   On: 09/14/2017 10:43    ASSESSMENT & PLAN:  CLL (chronic lymphocytic leukemia) ([place]) PET CT scan show partial response to treatment She is recovering well from recent infection I recommend she take prednisone daily at 10 mg on the days that she is off chemotherapy Next week, I  recommend she resume treatment on 09/19/2017 at reduced dose ibrutinib I plan to see her back again in 3 weeks for further assessment  Irregular heart beat She has intermittent palpitation likely secondary to side effects of treatment For now, she will hold off and resume next week with dose adjustment  Sinusitis, acute Sinusitis is improving on antibiotic treatment I recommend addition of prednisone She will resume chemotherapy next week   No orders of the defined types were placed in this encounter.  All questions were answered. The patient knows to call the clinic with any problems, questions or concerns. No barriers to learning was detected. I spent 15 minutes counseling the patient face to face. The total time spent in the appointment was 20 minutes and more than 50% was on counseling and review of test results     Heath Lark, MD 09/15/2017 11:21 AM

## 2017-09-15 NOTE — Assessment & Plan Note (Signed)
Sinusitis is improving on antibiotic treatment I recommend addition of prednisone She will resume chemotherapy next week

## 2017-09-15 NOTE — Assessment & Plan Note (Signed)
PET CT scan show partial response to treatment She is recovering well from recent infection I recommend she take prednisone daily at 10 mg on the days that she is off chemotherapy Next week, I recommend she resume treatment on 09/19/2017 at reduced dose ibrutinib I plan to see her back again in 3 weeks for further assessment

## 2017-09-15 NOTE — Assessment & Plan Note (Signed)
She has intermittent palpitation likely secondary to side effects of treatment For now, she will hold off and resume next week with dose adjustment

## 2017-09-15 NOTE — Telephone Encounter (Signed)
Gave patient avs report and appointments for October  °

## 2017-09-16 ENCOUNTER — Other Ambulatory Visit: Payer: Self-pay | Admitting: Pharmacist

## 2017-10-06 ENCOUNTER — Telehealth: Payer: Self-pay | Admitting: Hematology and Oncology

## 2017-10-06 ENCOUNTER — Other Ambulatory Visit: Payer: Self-pay | Admitting: Pharmacist

## 2017-10-06 ENCOUNTER — Encounter: Payer: Self-pay | Admitting: Hematology and Oncology

## 2017-10-06 ENCOUNTER — Ambulatory Visit (HOSPITAL_BASED_OUTPATIENT_CLINIC_OR_DEPARTMENT_OTHER): Payer: Medicare Other | Admitting: Hematology and Oncology

## 2017-10-06 ENCOUNTER — Other Ambulatory Visit (HOSPITAL_BASED_OUTPATIENT_CLINIC_OR_DEPARTMENT_OTHER): Payer: Medicare Other

## 2017-10-06 VITALS — BP 125/56 | HR 60 | Temp 98.1°F | Resp 18 | Ht 61.0 in | Wt 130.3 lb

## 2017-10-06 DIAGNOSIS — D61818 Other pancytopenia: Secondary | ICD-10-CM | POA: Diagnosis not present

## 2017-10-06 DIAGNOSIS — C9110 Chronic lymphocytic leukemia of B-cell type not having achieved remission: Secondary | ICD-10-CM | POA: Diagnosis not present

## 2017-10-06 DIAGNOSIS — I499 Cardiac arrhythmia, unspecified: Secondary | ICD-10-CM

## 2017-10-06 DIAGNOSIS — Z23 Encounter for immunization: Secondary | ICD-10-CM | POA: Diagnosis not present

## 2017-10-06 LAB — COMPREHENSIVE METABOLIC PANEL
ALT: 21 U/L (ref 0–55)
ANION GAP: 9 meq/L (ref 3–11)
AST: 34 U/L (ref 5–34)
Albumin: 3.8 g/dL (ref 3.5–5.0)
Alkaline Phosphatase: 87 U/L (ref 40–150)
BILIRUBIN TOTAL: 0.46 mg/dL (ref 0.20–1.20)
BUN: 13.7 mg/dL (ref 7.0–26.0)
CHLORIDE: 106 meq/L (ref 98–109)
CO2: 27 meq/L (ref 22–29)
Calcium: 9.2 mg/dL (ref 8.4–10.4)
Creatinine: 0.7 mg/dL (ref 0.6–1.1)
GLUCOSE: 93 mg/dL (ref 70–140)
Potassium: 4.2 mEq/L (ref 3.5–5.1)
SODIUM: 142 meq/L (ref 136–145)
TOTAL PROTEIN: 5.8 g/dL — AB (ref 6.4–8.3)

## 2017-10-06 LAB — CBC WITH DIFFERENTIAL/PLATELET
BASO%: 1 % (ref 0.0–2.0)
Basophils Absolute: 0 10*3/uL (ref 0.0–0.1)
EOS ABS: 0.1 10*3/uL (ref 0.0–0.5)
EOS%: 2.3 % (ref 0.0–7.0)
HCT: 41.8 % (ref 34.8–46.6)
HGB: 13.6 g/dL (ref 11.6–15.9)
LYMPH%: 56 % — AB (ref 14.0–49.7)
MCH: 30.3 pg (ref 25.1–34.0)
MCHC: 32.5 g/dL (ref 31.5–36.0)
MCV: 93.5 fL (ref 79.5–101.0)
MONO#: 0.4 10*3/uL (ref 0.1–0.9)
MONO%: 11.1 % (ref 0.0–14.0)
NEUT#: 1.1 10*3/uL — ABNORMAL LOW (ref 1.5–6.5)
NEUT%: 29.6 % — AB (ref 38.4–76.8)
PLATELETS: 116 10*3/uL — AB (ref 145–400)
RBC: 4.47 10*6/uL (ref 3.70–5.45)
RDW: 14.7 % — ABNORMAL HIGH (ref 11.2–14.5)
WBC: 3.6 10*3/uL — AB (ref 3.9–10.3)
lymph#: 2 10*3/uL (ref 0.9–3.3)

## 2017-10-06 LAB — LACTATE DEHYDROGENASE: LDH: 189 U/L (ref 125–245)

## 2017-10-06 MED ORDER — METOPROLOL TARTRATE 25 MG PO TABS: 12.5000 mg | ORAL_TABLET | Freq: Two times a day (BID) | ORAL | 1 refills | Status: DC

## 2017-10-06 MED ORDER — IBRUTINIB 420 MG PO TABS: 280.0000 mg | ORAL_TABLET | Freq: Every day | ORAL | 11 refills | Status: DC

## 2017-10-06 MED ORDER — IBRUTINIB 280 MG PO TABS: 1.0000 | ORAL_TABLET | Freq: Every day | ORAL | 11 refills | Status: DC

## 2017-10-06 MED ORDER — ACYCLOVIR 400 MG PO TABS: 400.0000 mg | ORAL_TABLET | Freq: Every day | ORAL | 6 refills | Status: AC

## 2017-10-06 MED ORDER — INFLUENZA VAC SPLIT QUAD 0.5 ML IM SUSY
0.5000 mL | PREFILLED_SYRINGE | Freq: Once | INTRAMUSCULAR | Status: AC
Start: 2017-10-06 — End: 2017-10-06
  Administered 2017-10-06: 0.5 mL via INTRAMUSCULAR
  Filled 2017-10-06: qty 0.5

## 2017-10-06 NOTE — Assessment & Plan Note (Addendum)
PET CT scan show partial response to treatment She tolerated reduced dose ibrutinib well I will get insurance prior authorization for reduced dose at 280 mg daily We will try to get new prescription sent to her soon She will complete her old prescription and then will have a few days break pending new prescription I recommend she take prednisone daily at 10 mg on the days that she is off chemotherapy She will start new cycle of treatment again on October 17, 2017 and I plan to see her back within 2 weeks for further assessment of treatment side effects We discussed the importance of preventive care and reviewed the vaccination programs. She does not have any prior allergic reactions to influenza vaccination. She agrees to proceed with influenza vaccination today and we will administer it today at the clinic.

## 2017-10-06 NOTE — Assessment & Plan Note (Signed)
She had paroxysmal irregular heartbeat I recommend reduced dose ibrutinib

## 2017-10-06 NOTE — Assessment & Plan Note (Addendum)
She has acquired pancytopenia due to treatment She is not symptomatic Observe only We discussed neutropenic precaution

## 2017-10-06 NOTE — Progress Notes (Signed)
[facility] OFFICE PROGRESS NOTE  Patient Care Team: Kelton Pillar, MD as PCP - General (Family Medicine)  SUMMARY OF ONCOLOGIC HISTORY:   CLL (chronic lymphocytic leukemia) ([place])   11/29/2001 Pathology Results    [phone] Left neck biopsy: LEFT NECK LYMPH NODE, EXCISION: SMALL LYMPHOCYTIC LYMPHOMA/CHRONIC LYMPHOCYTIC LEUKEMIA, SEE COMMENT  COMMENT Sections show effacement of the lymph node architecture by uniform small lymphoid cells displaying chromatin clumping and small to inconspicuous nucleoli. This is associated with numerous proliferation centers (pseudofollicular centers). There is no evidence of high grade transformation. Flow cytometric analysis showed a monoclonal, lambda restricted B cell population expressing pan B cell antigens including CD20 and CD23 with coexpression of CD5 and CD20. 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      01/04/2002 Bone Marrow Biopsy    BONE MARROW, TOUCH IMPRINTS AND CORE BIOPSY: INVOLVEMENT BY SMALL LYMPHOCYTIC LYMPHOMA/CHRONIC LYMPHOCYTIC LEUKEMIA      01/06/2004 Miscellaneous    She was treated for the CLL with Rituxan in 2005/ 2006 and again Jan 2008 thru Feb 2009,with good response, butheld then due to complaints of transient short term memory loss after each RItuxan treatment      10/31/2016 PET scan    Diffuse cervical, axillary, retroperitoneal, and pelvic adenopathy is noted which exhibits very mild increased FDG uptake. I suspect the mild level of increased FDG uptake reflects the inherent hypometabolic state of the tumor as opposed to lack of viable tumor.  2. Brown fat within bilateral supraclavicular and paraspinal fat.      11/12/2016 Imaging    Ct scan showed significant progression of bulky confluent retroperitoneal and bilateral pelvic lymphadenopathy. Mild progression of thoracic lymphadenopathy involving the bilateral axillary, mediastinal and bilateral  hilar chains. 2. New extensive fine perilymphatic distribution nodularity and interlobular septal thickening throughout both lungs, most consistent with lymphangitic tumor, as can be seen in lymphoproliferative disorders. 3. Normal size spleen. 4. Additional findings include aortic atherosclerosis, stable 2.0 cm inferior thyroid isthmus nodule mild sigmoid diverticulosis, mild superior L1 vertebral compression fracture of indeterminate chronicity (new since 03/06/2013), and increased size of moderate midline high ventral abdominal wall hernia containing fat and fluid.      12/31/2016 Pathology Results    Lymph node for lymphoma, left supraclavicular - SMALL LYMPHOCYTIC LYMPHOMA. - SEE ONCOLOGY TABLE. Microscopic Comment LYMPHOMA Histologic type: Non-Hodgkin lymphoma, small lymphocytic type. Grade (if applicable): Low grade. Flow cytometry: Monoclonal, lambda restricted B-cell population expressing pan B-cell antigens including CD20 associated with dim CD5 expression. No CD10 expression is identified ([id number]). Immunohistochemical stains: CD10, CD20, CD3, CD43, CD5, CD79a, and cyclin D1 with appropriate controls. Touch preps/imprints: Predominance of small lymphoid cells with high nuclear cytoplasmic ratio, coarse chromatin and small to inconspicuous nucleoli admixed with larger lymphoid cells to a much lesser extent. Comments: The sections show lymph nodal tissue displaying effacement of the architecture by a lymphoproliferative process characterized by predominance of small lymphoid cells with high nuclear cytoplasmic ratio, round to slightly irregular nuclei, coarse chromatin and small to inconspicuous nucleoli. This is associated with abundant variably sized proliferation centers (pseudofollicular centers) imparting a vaguely nodular pattern. Flow cytometric analysis shows a monoclonal, lambda restricted B-cell population expressing pan B-cell antigens associated with dim CD5 expression. In addition,  immunohistochemical stains were performed and show that the lymphoid cells are predominantly composed of B cells as seen with CD20 and CD79a associated with co-expression with CD43 and weak co-expression with CD5. No significant CD10 or cyclin D1 positivity  is identified. There is admixed T-cell population to a lesser extent as primarily seen with CD3. The findings are consistent with involvement by small lymphocytic lymphoma/chronic lymphocytic leukemia. Clinical correlation is recommended. (BNS:ecj 01/04/2017)      12/31/2016 Surgery    She had excisional biopsy left supraclavicular lymph nodes      02/14/2017 - 05/03/2017 Chemotherapy    The patient had treatment with Gazyva. She has delay in starting treatment due to inability to get insurance approval for ibrutinib.      02/21/2017 Adverse Reaction    Treatment was delayed by 1 week due to pancytopenia      05/30/2017 PET scan    The index left level 5 cervical lymph node measuring 2 cm on today's study previously measured 1.3 cm. The index right level 4 cervical node measures 1.6 cm is on today's study, previously 1.4 cm. The index right axillary node measures 1.3 cm on today's study. Previously 1.1 cm. The index right paratracheal node measures 1.4 cm on the current study. Previously 1.1 cm. Within the abdomen the large retroperitoneal nodal mass measures 9.2 by 4.6 cm. This is compared with 8.7 x 4.8 cm previously. Within the right external iliac lymph node chain there is a 1.3 cm lymph node on today's study. Previously this measured 1 cm. Left external iliac lymph node measures 1.2 cm on today's study. Previously 1 cm.  IMPRESSION: 1. When compared with 02/19/2017 the index lymph nodes within the neck, chest, abdomen and pelvis demonstrate mild increase in size in the interval.      06/17/2017 -  Chemotherapy    She started taking Ibrutinib      09/14/2017 PET scan    1. Reduction in size of the adenopathy in the neck, chest, and  abdomen/pelvis. Generalized slight reduction in the SUV, currently Deauville 2 and Deauville 3. 2. Other imaging findings of potential clinical significance: Mild cardiomegaly. Sub xiphoid hernia containing adipose tissue and a small amount of fluid similar to prior. Aortic Atherosclerosis (ICD10-I70.0).       INTERVAL HISTORY: Please see below for problem oriented charting. She is seen with her husband for further follow-up She is doing well and gaining weight No infection She continues to have intermittent palpitation Denies diarrhea Denies significant bruising She has persistent lower bilateral edema  REVIEW OF SYSTEMS:   Constitutional: Denies fevers, chills or abnormal weight loss Eyes: Denies blurriness of vision Ears, nose, mouth, throat, and face: Denies mucositis or sore throat Respiratory: Denies cough, dyspnea or wheezes Cardiovascular: Denies palpitation, chest discomfort  Gastrointestinal:  Denies nausea, heartburn or change in bowel habits Skin: Denies abnormal skin rashes Lymphatics: Denies new lymphadenopathy or easy bruising Neurological:Denies numbness, tingling or new weaknesses Behavioral/Psych: Mood is stable, no new changes  All other systems were reviewed with the patient and are negative.  I have reviewed the past medical history, past surgical history, social history and family history with the patient and they are unchanged from previous note.  ALLERGIES:  is allergic to gluten meal; influenza virus vacc split pf; nutrasweet aspartame [aspartame]; salicylates cross reactors; and sulfa antibiotics.  MEDICATIONS:  Current Outpatient Prescriptions  Medication Sig Dispense Refill  . acyclovir (ZOVIRAX) 400 MG tablet Take 1 tablet (400 mg total) by mouth daily. 90 tablet 6  . Calcium Carbonate-Vitamin D (CALCIUM 600+D) 600-200 MG-UNIT TABS Take 1 tablet by mouth 2 (two) times daily.    Marland Kitchen guaiFENesin-codeine 100-10 MG/5ML syrup Take 5 mLs by mouth 3 (three)  times daily  as needed for cough. 473 mL 0  . Ibrutinib 280 MG TABS Take 1 tablet by mouth daily. Take with a full glass of water, maintain adequate hydration. 30 tablet 11  . lidocaine-prilocaine (EMLA) cream Apply 1 application topically as needed. 30 g 6  . metoprolol tartrate (LOPRESSOR) 25 MG tablet Take 0.5 tablets (12.5 mg total) by mouth 2 (two) times daily. 90 tablet 1  . Multiple Vitamin (MULTIVITAMIN WITH MINERALS) TABS tablet Take 1 tablet by mouth daily.    . Omega-3 Fatty Acids (FISH OIL) 1000 MG CAPS Take by mouth daily.    . ondansetron (ZOFRAN ODT) 4 MG disintegrating tablet '4mg'$  ODT q6 hours prn nausea/vomit 8 tablet 0  . PAZEO 0.7 % SOLN Place 1 drop into both eyes daily.  4  . prednisoLONE acetate (PRED FORTE) 1 % ophthalmic suspension Place 1 drop into the right eye 2 (two) times daily.  4   No current facility-administered medications for this visit.     PHYSICAL EXAMINATION: ECOG PERFORMANCE STATUS: 1 - Symptomatic but completely ambulatory  Vitals:   10/06/17 1400  BP: (!) 125/56  Pulse: 60  Resp: 18  Temp: 98.1 F (36.7 C)  SpO2: 100%   Filed Weights   10/06/17 1400  Weight: 130 lb 4.8 oz (59.1 kg)    GENERAL:alert, no distress and comfortable SKIN: skin color, texture, turgor are normal, no rashes or significant lesions EYES: normal, Conjunctiva are pink and non-injected, sclera clear OROPHARYNX:no exudate, no erythema and lips, buccal mucosa, and tongue normal  NECK: supple, thyroid normal size, non-tender, without nodularity LYMPH:  no palpable lymphadenopathy in the cervical, axillary or inguinal LUNGS: clear to auscultation and percussion with normal breathing effort HEART: regular rate & rhythm and no murmurs with mild lower extremity edema ABDOMEN:abdomen soft, non-tender and normal bowel sounds Musculoskeletal:no cyanosis of digits and no clubbing  NEURO: alert & oriented x 3 with fluent speech, no focal motor/sensory deficits  LABORATORY DATA:   I have reviewed the data as listed    Component Value Date/Time   NA 142 10/06/2017 1342   K 4.2 10/06/2017 1342   CL 99 (L) 02/19/2017 0951   CL 106 03/21/2013 1302   CO2 27 10/06/2017 1342   GLUCOSE 93 10/06/2017 1342   GLUCOSE 87 03/21/2013 1302   BUN 13.7 10/06/2017 1342   CREATININE 0.7 10/06/2017 1342   CALCIUM 9.2 10/06/2017 1342   PROT 5.8 (L) 10/06/2017 1342   ALBUMIN 3.8 10/06/2017 1342   AST 34 10/06/2017 1342   ALT 21 10/06/2017 1342   ALKPHOS 87 10/06/2017 1342   BILITOT 0.46 10/06/2017 1342   GFRNONAA >60 02/19/2017 0951   GFRAA >60 02/19/2017 0951    No results found for: SPEP, UPEP  Lab Results  Component Value Date   WBC 3.6 (L) 10/06/2017   NEUTROABS 1.1 (L) 10/06/2017   HGB 13.6 10/06/2017   HCT 41.8 10/06/2017   MCV 93.5 10/06/2017   PLT 116 (L) 10/06/2017      Chemistry      Component Value Date/Time   NA 142 10/06/2017 1342   K 4.2 10/06/2017 1342   CL 99 (L) 02/19/2017 0951   CL 106 03/21/2013 1302   CO2 27 10/06/2017 1342   BUN 13.7 10/06/2017 1342   CREATININE 0.7 10/06/2017 1342      Component Value Date/Time   CALCIUM 9.2 10/06/2017 1342   ALKPHOS 87 10/06/2017 1342   AST 34 10/06/2017 1342   ALT 21 10/06/2017 1342  BILITOT 0.46 10/06/2017 1342       RADIOGRAPHIC STUDIES: I have personally reviewed the radiological images as listed and agreed with the findings in the report. Dg Chest 2 View  Result Date: 09/12/2017 CLINICAL DATA:  Sinus affection 9/13. Cough spasms. Denies headaches/sob. Hx asthma EXAM: CHEST  2 VIEW COMPARISON:  CT chest 02/19/2017, PET-CT 05/30/2017 FINDINGS: Normal cardiac silhouette with ectatic aorta. Mild fine peripheral interstitial markings. No effusion, infiltrate pneumothorax. IMPRESSION: Mild interstitial edema pattern.  No infiltrate. Electronically Signed   By: Suzy Bouchard M.D.   On: 09/12/2017 13:24   Nm Pet Image Restag (ps) Skull Base To Thigh  Result Date: 09/14/2017 CLINICAL DATA:   Subsequent treatment strategy for chronic lymphocytic leukemia. EXAM: NUCLEAR MEDICINE PET SKULL BASE TO THIGH TECHNIQUE: 6.4 mCi F-18 FDG was injected intravenously. Full-ring PET imaging was performed from the skull base to thigh after the radiotracer. CT data was obtained and used for attenuation correction and anatomic localization. FASTING BLOOD GLUCOSE:  Value: 97 mg/dl COMPARISON:  Multiple exams, including 05/30/2017 PET-CT FINDINGS: NECK Index level V lymph node conglomerate on image 39/4 measures 1.1 cm in short axis (formerly measured at 2.0 cm) with maximum SUV 1.6 (formerly the same.). The index right level IV lymph node shown on the prior exam is no longer seen is a structure separate from the right internal jugular vein. CHEST Index left axillary lymph node measures 0.6 cm in short axis on image 51/4 (formerly 1.1 cm) with maximum standard uptake value the 1.4 (formerly 1.9). The the index right axillary lymph node measures 0.8 cm in short axis on image 51/4 (formerly 1.3 cm) with maximum SUV 1.6 (formerly 1.9). The right lower paratracheal lymph node measures 1.3 cm in short axis on image 58/4 (formerly 1.4 cm) with maximum SUV 2.5 (formerly the same). Atherosclerotic calcification of the aortic arch. Mild biapical pleuroparenchymal scarring. Some mild interstitial accentuation in both lungs. Mild cardiomegaly. No definite worrisome pulmonary nodules. Background mediastinal blood pool activity level 2.1. ABDOMEN/PELVIS Index conglomerate nodal mass in the retroperitoneum potentially extending into the adjacent mesentery as a conglomerate size of 5.4 by 3.6 cm on image 122/4 (formerly 9.2 by 4.6 cm) and a maximum SUV of 2.5 (formerly 3.0). The index left external iliac node measures 1.0 cm in short axis on image 147/4 (formerly 1.2 cm) with maximum SUV 1.9 (formerly 2.0). The index right external iliac node measures 0.6 cm in short axis on image 142/4 (formerly 1.3 cm) with maximum standard uptake  value 2.0 (formerly 2.1). No significant abnormal focal activity is observed in the liver, spleen, or pancreas. No splenomegaly. Physiologic bowel activity noted. Sub xiphoid hernia containing adipose tissue and a small amount of fluid, similar to prior. Aortoiliac atherosclerotic vascular disease. Background hepatic activity level SUV 2.7. SKELETON No focal hypermetabolic activity to suggest skeletal metastasis. IMPRESSION: 1. Reduction in size of the adenopathy in the neck, chest, and abdomen/pelvis. Generalized slight reduction in the SUV, currently Deauville 2 and Deauville 3. 2. Other imaging findings of potential clinical significance: Mild cardiomegaly. Sub xiphoid hernia containing adipose tissue and a small amount of fluid similar to prior. Aortic Atherosclerosis (ICD10-I70.0). Electronically Signed   By: Van Clines M.D.   On: 09/14/2017 10:43    ASSESSMENT & PLAN:  CLL (chronic lymphocytic leukemia) ([place]) PET CT scan show partial response to treatment She tolerated reduced dose ibrutinib well I will get insurance prior authorization for reduced dose at 280 mg daily We will try to get new  prescription sent to her soon She will complete her old prescription and then will have a few days break pending new prescription I recommend she take prednisone daily at 10 mg on the days that she is off chemotherapy She will start new cycle of treatment again on October 17, 2017 and I plan to see her back within 2 weeks for further assessment of treatment side effects We discussed the importance of preventive care and reviewed the vaccination programs. She does not have any prior allergic reactions to influenza vaccination. She agrees to proceed with influenza vaccination today and we will administer it today at the clinic.    Pancytopenia, acquired ([place]) She has acquired pancytopenia due to treatment She is not symptomatic Observe only We discussed neutropenic precaution  Irregular heart  beat She had paroxysmal irregular heartbeat I recommend reduced dose ibrutinib   No orders of the defined types were placed in this encounter.  All questions were answered. The patient knows to call the clinic with any problems, questions or concerns. No barriers to learning was detected. I spent 15 minutes counseling the patient face to face. The total time spent in the appointment was 20 minutes and more than 50% was on counseling and review of test results     Heath Lark, MD 10/07/2017 8:12 AM

## 2017-10-06 NOTE — Telephone Encounter (Signed)
Scheduled appt per 10/11 los - Gave patient AVS and calender per los.  

## 2017-10-07 MED FILL — IMBRUVICA 280 MG TAB: 280 | 28 days supply | Qty: 28 | Fill #0

## 2017-10-20 DIAGNOSIS — H401131 Primary open-angle glaucoma, bilateral, mild stage: Secondary | ICD-10-CM | POA: Diagnosis not present

## 2017-10-20 DIAGNOSIS — H20041 Secondary noninfectious iridocyclitis, right eye: Secondary | ICD-10-CM | POA: Diagnosis not present

## 2017-10-20 DIAGNOSIS — H04123 Dry eye syndrome of bilateral lacrimal glands: Secondary | ICD-10-CM | POA: Diagnosis not present

## 2017-10-20 DIAGNOSIS — H16223 Keratoconjunctivitis sicca, not specified as Sjogren's, bilateral: Secondary | ICD-10-CM | POA: Diagnosis not present

## 2017-10-31 ENCOUNTER — Telehealth: Payer: Self-pay | Admitting: Hematology and Oncology

## 2017-10-31 ENCOUNTER — Ambulatory Visit (HOSPITAL_BASED_OUTPATIENT_CLINIC_OR_DEPARTMENT_OTHER): Payer: Medicare Other | Admitting: Hematology and Oncology

## 2017-10-31 ENCOUNTER — Encounter: Payer: Self-pay | Admitting: Hematology and Oncology

## 2017-10-31 ENCOUNTER — Other Ambulatory Visit (HOSPITAL_BASED_OUTPATIENT_CLINIC_OR_DEPARTMENT_OTHER): Payer: Medicare Other

## 2017-10-31 DIAGNOSIS — Z23 Encounter for immunization: Secondary | ICD-10-CM

## 2017-10-31 DIAGNOSIS — C9110 Chronic lymphocytic leukemia of B-cell type not having achieved remission: Secondary | ICD-10-CM

## 2017-10-31 DIAGNOSIS — D61818 Other pancytopenia: Secondary | ICD-10-CM | POA: Diagnosis not present

## 2017-10-31 DIAGNOSIS — I499 Cardiac arrhythmia, unspecified: Secondary | ICD-10-CM

## 2017-10-31 LAB — COMPREHENSIVE METABOLIC PANEL
ALBUMIN: 3.8 g/dL (ref 3.5–5.0)
ALT: 48 U/L (ref 0–55)
ANION GAP: 7 meq/L (ref 3–11)
AST: 50 U/L — AB (ref 5–34)
Alkaline Phosphatase: 85 U/L (ref 40–150)
BILIRUBIN TOTAL: 0.76 mg/dL (ref 0.20–1.20)
BUN: 11.2 mg/dL (ref 7.0–26.0)
CALCIUM: 9.3 mg/dL (ref 8.4–10.4)
CHLORIDE: 103 meq/L (ref 98–109)
CO2: 30 mEq/L — ABNORMAL HIGH (ref 22–29)
CREATININE: 0.8 mg/dL (ref 0.6–1.1)
EGFR: >60 mL/min/{1.73_m2} (ref >60)
Glucose: 88 mg/dl (ref 70–140)
Potassium: 4 mEq/L (ref 3.5–5.1)
SODIUM: 139 meq/L (ref 136–145)
TOTAL PROTEIN: 6.2 g/dL — AB (ref 6.4–8.3)

## 2017-10-31 LAB — CBC WITH DIFFERENTIAL/PLATELET
BASO%: 2.2 % — AB (ref 0.0–2.0)
Basophils Absolute: 0.1 10*3/uL (ref 0.0–0.1)
EOS%: 1.7 % (ref 0.0–7.0)
Eosinophils Absolute: 0.1 10*3/uL (ref 0.0–0.5)
HCT: 38.4 % (ref 34.8–46.6)
HEMOGLOBIN: 12.5 g/dL (ref 11.6–15.9)
LYMPH%: 52.1 % — AB (ref 14.0–49.7)
MCH: 30.6 pg (ref 25.1–34.0)
MCHC: 32.6 g/dL (ref 31.5–36.0)
MCV: 94.1 fL (ref 79.5–101.0)
MONO#: 0.4 10*3/uL (ref 0.1–0.9)
MONO%: 11.8 % (ref 0.0–14.0)
NEUT%: 32.2 % — ABNORMAL LOW (ref 38.4–76.8)
NEUTROS ABS: 1.2 10*3/uL — AB (ref 1.5–6.5)
Platelets: 124 10*3/uL — ABNORMAL LOW (ref 145–400)
RBC: 4.08 10*6/uL (ref 3.70–5.45)
RDW: 14.9 % — AB (ref 11.2–14.5)
WBC: 3.6 10*3/uL — AB (ref 3.9–10.3)
lymph#: 1.9 10*3/uL (ref 0.9–3.3)

## 2017-10-31 LAB — LACTATE DEHYDROGENASE: LDH: 219 U/L (ref 125–245)

## 2017-10-31 MED ORDER — METOPROLOL TARTRATE 25 MG PO TABS: 25.0000 mg | ORAL_TABLET | Freq: Two times a day (BID) | ORAL | 1 refills | Status: DC

## 2017-10-31 NOTE — Progress Notes (Signed)
[facility] OFFICE PROGRESS NOTE  Patient Care Team: Kelton Pillar, MD as PCP - General (Family Medicine)  SUMMARY OF ONCOLOGIC HISTORY:   CLL (chronic lymphocytic leukemia) ([place])   11/29/2001 Pathology Results    [phone] Left neck biopsy: LEFT NECK LYMPH NODE, EXCISION: SMALL LYMPHOCYTIC LYMPHOMA/CHRONIC LYMPHOCYTIC LEUKEMIA, SEE COMMENT  COMMENT Sections show effacement of the lymph node architecture by uniform small lymphoid cells displaying chromatin clumping and small to inconspicuous nucleoli. This is associated with numerous proliferation centers (pseudofollicular centers). There is no evidence of high grade transformation. Flow cytometric analysis showed a monoclonal, lambda restricted B cell population expressing pan B cell antigens including CD20 and CD23 with coexpression of CD5 and CD20. 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      01/04/2002 Bone Marrow Biopsy    BONE MARROW, TOUCH IMPRINTS AND CORE BIOPSY: INVOLVEMENT BY SMALL LYMPHOCYTIC LYMPHOMA/CHRONIC LYMPHOCYTIC LEUKEMIA      01/06/2004 Miscellaneous    She was treated for the CLL with Rituxan in 2005/ 2006 and again Jan 2008 thru Feb 2009,with good response, butheld then due to complaints of transient short term memory loss after each RItuxan treatment      10/31/2016 PET scan    Diffuse cervical, axillary, retroperitoneal, and pelvic adenopathy is noted which exhibits very mild increased FDG uptake. I suspect the mild level of increased FDG uptake reflects the inherent hypometabolic state of the tumor as opposed to lack of viable tumor.  2. Brown fat within bilateral supraclavicular and paraspinal fat.      11/12/2016 Imaging    Ct scan showed significant progression of bulky confluent retroperitoneal and bilateral pelvic lymphadenopathy. Mild progression of thoracic lymphadenopathy involving the bilateral axillary, mediastinal and bilateral  hilar chains. 2. New extensive fine perilymphatic distribution nodularity and interlobular septal thickening throughout both lungs, most consistent with lymphangitic tumor, as can be seen in lymphoproliferative disorders. 3. Normal size spleen. 4. Additional findings include aortic atherosclerosis, stable 2.0 cm inferior thyroid isthmus nodule mild sigmoid diverticulosis, mild superior L1 vertebral compression fracture of indeterminate chronicity (new since 03/06/2013), and increased size of moderate midline high ventral abdominal wall hernia containing fat and fluid.      12/31/2016 Pathology Results    Lymph node for lymphoma, left supraclavicular - SMALL LYMPHOCYTIC LYMPHOMA. - SEE ONCOLOGY TABLE. Microscopic Comment LYMPHOMA Histologic type: Non-Hodgkin lymphoma, small lymphocytic type. Grade (if applicable): Low grade. Flow cytometry: Monoclonal, lambda restricted B-cell population expressing pan B-cell antigens including CD20 associated with dim CD5 expression. No CD10 expression is identified ([id number]). Immunohistochemical stains: CD10, CD20, CD3, CD43, CD5, CD79a, and cyclin D1 with appropriate controls. Touch preps/imprints: Predominance of small lymphoid cells with high nuclear cytoplasmic ratio, coarse chromatin and small to inconspicuous nucleoli admixed with larger lymphoid cells to a much lesser extent. Comments: The sections show lymph nodal tissue displaying effacement of the architecture by a lymphoproliferative process characterized by predominance of small lymphoid cells with high nuclear cytoplasmic ratio, round to slightly irregular nuclei, coarse chromatin and small to inconspicuous nucleoli. This is associated with abundant variably sized proliferation centers (pseudofollicular centers) imparting a vaguely nodular pattern. Flow cytometric analysis shows a monoclonal, lambda restricted B-cell population expressing pan B-cell antigens associated with dim CD5 expression. In addition,  immunohistochemical stains were performed and show that the lymphoid cells are predominantly composed of B cells as seen with CD20 and CD79a associated with co-expression with CD43 and weak co-expression with CD5. No significant CD10 or cyclin D1 positivity  is identified. There is admixed T-cell population to a lesser extent as primarily seen with CD3. The findings are consistent with involvement by small lymphocytic lymphoma/chronic lymphocytic leukemia. Clinical correlation is recommended. (BNS:ecj 01/04/2017)      12/31/2016 Surgery    She had excisional biopsy left supraclavicular lymph nodes      02/14/2017 - 05/03/2017 Chemotherapy    The patient had treatment with Gazyva. She has delay in starting treatment due to inability to get insurance approval for ibrutinib.      02/21/2017 Adverse Reaction    Treatment was delayed by 1 week due to pancytopenia      05/30/2017 PET scan    The index left level 5 cervical lymph node measuring 2 cm on today's study previously measured 1.3 cm. The index right level 4 cervical node measures 1.6 cm is on today's study, previously 1.4 cm. The index right axillary node measures 1.3 cm on today's study. Previously 1.1 cm. The index right paratracheal node measures 1.4 cm on the current study. Previously 1.1 cm. Within the abdomen the large retroperitoneal nodal mass measures 9.2 by 4.6 cm. This is compared with 8.7 x 4.8 cm previously. Within the right external iliac lymph node chain there is a 1.3 cm lymph node on today's study. Previously this measured 1 cm. Left external iliac lymph node measures 1.2 cm on today's study. Previously 1 cm.  IMPRESSION: 1. When compared with 02/19/2017 the index lymph nodes within the neck, chest, abdomen and pelvis demonstrate mild increase in size in the interval.      06/17/2017 -  Chemotherapy    She started taking Ibrutinib      09/14/2017 PET scan    1. Reduction in size of the adenopathy in the neck, chest, and  abdomen/pelvis. Generalized slight reduction in the SUV, currently Deauville 2 and Deauville 3. 2. Other imaging findings of potential clinical significance: Mild cardiomegaly. Sub xiphoid hernia containing adipose tissue and a small amount of fluid similar to prior. Aortic Atherosclerosis (ICD10-I70.0).       INTERVAL HISTORY: Please see below for problem oriented charting. She returns for further follow-up She continues to have occasional palpitation She complain of easy bruising The patient denies any recent signs or symptoms of bleeding such as spontaneous epistaxis, hematuria or hematochezia. She had a minor recent bronchitis symptoms, resolved She denies diarrhea Her leg swelling is stable  REVIEW OF SYSTEMS:   Constitutional: Denies fevers, chills or abnormal weight loss Eyes: Denies blurriness of vision Ears, nose, mouth, throat, and face: Denies mucositis or sore throat Respiratory: Denies cough, dyspnea or wheezes Gastrointestinal:  Denies nausea, heartburn or change in bowel habits Skin: Denies abnormal skin rashes Lymphatics: Denies new lymphadenopathy Neurological:Denies numbness, tingling or new weaknesses Behavioral/Psych: Mood is stable, no new changes  All other systems were reviewed with the patient and are negative.  I have reviewed the past medical history, past surgical history, social history and family history with the patient and they are unchanged from previous note.  ALLERGIES:  is allergic to gluten meal; influenza virus vacc split pf; nutrasweet aspartame [aspartame]; salicylates cross reactors; and sulfa antibiotics.  MEDICATIONS:  Current Outpatient Medications  Medication Sig Dispense Refill  . acyclovir (ZOVIRAX) 400 MG tablet Take 1 tablet (400 mg total) by mouth daily. 90 tablet 6  . Calcium Carbonate-Vitamin D (CALCIUM 600+D) 600-200 MG-UNIT TABS Take 1 tablet by mouth 2 (two) times daily.    Marland Kitchen guaiFENesin-codeine 100-10 MG/5ML syrup Take 5 mLs  by  mouth 3 (three) times daily as needed for cough. 473 mL 0  . Ibrutinib 280 MG TABS Take 1 tablet by mouth daily. Take with a full glass of water, maintain adequate hydration. 30 tablet 11  . metoprolol tartrate (LOPRESSOR) 25 MG tablet Take 1 tablet (25 mg total) 2 (two) times daily by mouth. 90 tablet 1  . Multiple Vitamin (MULTIVITAMIN WITH MINERALS) TABS tablet Take 1 tablet by mouth daily.    . ondansetron (ZOFRAN ODT) 4 MG disintegrating tablet 19m ODT q6 hours prn nausea/vomit 8 tablet 0  . PAZEO 0.7 % SOLN Place 1 drop into both eyes daily.  4  . prednisoLONE acetate (PRED FORTE) 1 % ophthalmic suspension Place 1 drop into the right eye 2 (two) times daily.  4   No current facility-administered medications for this visit.     PHYSICAL EXAMINATION: ECOG PERFORMANCE STATUS: 1 - Symptomatic but completely ambulatory  Vitals:   10/31/17 1443  BP: (!) 142/75  Pulse: 74  Resp: 20  Temp: 98.1 F (36.7 C)  SpO2: 100%   Filed Weights   10/31/17 1443  Weight: 131 lb 4.8 oz (59.6 kg)    GENERAL:alert, no distress and comfortable SKIN: skin color, texture, turgor are normal, no rashes or significant lesions.  Noted minor skin bruising EYES: normal, Conjunctiva are pink and non-injected, sclera clear OROPHARYNX:no exudate, no erythema and lips, buccal mucosa, and tongue normal  NECK: supple, thyroid normal size, non-tender, without nodularity LYMPH:  no palpable lymphadenopathy in the cervical, axillary or inguinal LUNGS: clear to auscultation and percussion with normal breathing effort HEART: regular rate & rhythm and no murmurs.  Occasional irregular heartbeat.  Stable mild peripheral edema ABDOMEN:abdomen soft, non-tender and normal bowel sounds Musculoskeletal:no cyanosis of digits and no clubbing  NEURO: alert & oriented x 3 with fluent speech, no focal motor/sensory deficits  LABORATORY DATA:  I have reviewed the data as listed    Component Value Date/Time   NA 139  10/31/2017 1429   K 4.0 10/31/2017 1429   CL 99 (L) 02/19/2017 0951   CL 106 03/21/2013 1302   CO2 30 (H) 10/31/2017 1429   GLUCOSE 88 10/31/2017 1429   GLUCOSE 87 03/21/2013 1302   BUN 11.2 10/31/2017 1429   CREATININE 0.8 10/31/2017 1429   CALCIUM 9.3 10/31/2017 1429   PROT 6.2 (L) 10/31/2017 1429   ALBUMIN 3.8 10/31/2017 1429   AST 50 (H) 10/31/2017 1429   ALT 48 10/31/2017 1429   ALKPHOS 85 10/31/2017 1429   BILITOT 0.76 10/31/2017 1429   GFRNONAA >60 02/19/2017 0951   GFRAA >60 02/19/2017 0951    No results found for: SPEP, UPEP  Lab Results  Component Value Date   WBC 3.6 (L) 10/31/2017   NEUTROABS 1.2 (L) 10/31/2017   HGB 12.5 10/31/2017   HCT 38.4 10/31/2017   MCV 94.1 10/31/2017   PLT 124 (L) 10/31/2017      Chemistry      Component Value Date/Time   NA 139 10/31/2017 1429   K 4.0 10/31/2017 1429   CL 99 (L) 02/19/2017 0951   CL 106 03/21/2013 1302   CO2 30 (H) 10/31/2017 1429   BUN 11.2 10/31/2017 1429   CREATININE 0.8 10/31/2017 1429      Component Value Date/Time   CALCIUM 9.3 10/31/2017 1429   ALKPHOS 85 10/31/2017 1429   AST 50 (H) 10/31/2017 1429   ALT 48 10/31/2017 1429   BILITOT 0.76 10/31/2017 1429  ASSESSMENT & PLAN:  CLL (chronic lymphocytic leukemia) ([place]) PET CT scan show partial response to treatment She tolerated reduced dose ibrutinib well except for occasional palpitation The patient is educated to watch out for severe irregular heartbeat If that happens, she will interrupt her ibrutinib and substitute with prednisone We will continue to see her on a regular basis next month   Pancytopenia, acquired ([place]) She has acquired pancytopenia due to treatment She is not symptomatic Observe only We discussed neutropenic precaution  Irregular heart beat She had paroxysmal irregular heartbeat I recommend reduced dose ibrutinib I recommend increasing metoprolol to 25 mg twice a day for symptom management   No orders of the  defined types were placed in this encounter.  All questions were answered. The patient knows to call the clinic with any problems, questions or concerns. No barriers to learning was detected. I spent 15 minutes counseling the patient face to face. The total time spent in the appointment was 20 minutes and more than 50% was on counseling and review of test results     Heath Lark, MD 10/31/2017 3:16 PM

## 2017-10-31 NOTE — Assessment & Plan Note (Signed)
She has acquired pancytopenia due to treatment She is not symptomatic Observe only We discussed neutropenic precaution

## 2017-10-31 NOTE — Assessment & Plan Note (Signed)
She had paroxysmal irregular heartbeat I recommend reduced dose ibrutinib I recommend increasing metoprolol to 25 mg twice a day for symptom management

## 2017-10-31 NOTE — Telephone Encounter (Signed)
Gave avs and calendar for December  °

## 2017-10-31 NOTE — Assessment & Plan Note (Signed)
PET CT scan show partial response to treatment She tolerated reduced dose ibrutinib well except for occasional palpitation The patient is educated to watch out for severe irregular heartbeat If that happens, she will interrupt her ibrutinib and substitute with prednisone We will continue to see her on a regular basis next month

## 2017-11-07 MED FILL — IMBRUVICA 280 MG TAB: 280 | 28 days supply | Qty: 28 | Fill #1

## 2017-12-08 DIAGNOSIS — M810 Age-related osteoporosis without current pathological fracture: Secondary | ICD-10-CM | POA: Diagnosis not present

## 2017-12-08 DIAGNOSIS — M8589 Other specified disorders of bone density and structure, multiple sites: Secondary | ICD-10-CM | POA: Diagnosis not present

## 2017-12-08 DIAGNOSIS — Z1231 Encounter for screening mammogram for malignant neoplasm of breast: Secondary | ICD-10-CM | POA: Diagnosis not present

## 2017-12-08 DIAGNOSIS — Z803 Family history of malignant neoplasm of breast: Secondary | ICD-10-CM | POA: Diagnosis not present

## 2017-12-09 MED FILL — IMBRUVICA 280 MG TAB: 280 | 28 days supply | Qty: 28 | Fill #2

## 2017-12-12 ENCOUNTER — Telehealth: Payer: Self-pay | Admitting: Hematology and Oncology

## 2017-12-12 ENCOUNTER — Other Ambulatory Visit (HOSPITAL_BASED_OUTPATIENT_CLINIC_OR_DEPARTMENT_OTHER): Payer: Medicare Other

## 2017-12-12 ENCOUNTER — Ambulatory Visit (HOSPITAL_BASED_OUTPATIENT_CLINIC_OR_DEPARTMENT_OTHER): Payer: Medicare Other | Admitting: Hematology and Oncology

## 2017-12-12 VITALS — BP 151/73 | HR 69 | Temp 97.8°F | Resp 18 | Ht 61.0 in | Wt 134.2 lb

## 2017-12-12 DIAGNOSIS — D696 Thrombocytopenia, unspecified: Secondary | ICD-10-CM | POA: Diagnosis not present

## 2017-12-12 DIAGNOSIS — L608 Other nail disorders: Secondary | ICD-10-CM | POA: Insufficient documentation

## 2017-12-12 DIAGNOSIS — R0600 Dyspnea, unspecified: Secondary | ICD-10-CM | POA: Insufficient documentation

## 2017-12-12 DIAGNOSIS — I499 Cardiac arrhythmia, unspecified: Secondary | ICD-10-CM | POA: Diagnosis not present

## 2017-12-12 DIAGNOSIS — L989 Disorder of the skin and subcutaneous tissue, unspecified: Secondary | ICD-10-CM

## 2017-12-12 DIAGNOSIS — R0609 Other forms of dyspnea: Secondary | ICD-10-CM

## 2017-12-12 DIAGNOSIS — C9110 Chronic lymphocytic leukemia of B-cell type not having achieved remission: Secondary | ICD-10-CM

## 2017-12-12 LAB — COMPREHENSIVE METABOLIC PANEL
ALT: 26 U/L (ref 0–55)
ANION GAP: 8 meq/L (ref 3–11)
AST: 39 U/L — AB (ref 5–34)
Albumin: 3.9 g/dL (ref 3.5–5.0)
Alkaline Phosphatase: 78 U/L (ref 40–150)
BILIRUBIN TOTAL: 0.5 mg/dL (ref 0.20–1.20)
BUN: 13.7 mg/dL (ref 7.0–26.0)
CHLORIDE: 104 meq/L (ref 98–109)
CO2: 29 meq/L (ref 22–29)
CREATININE: 0.7 mg/dL (ref 0.6–1.1)
Calcium: 9.1 mg/dL (ref 8.4–10.4)
EGFR: >60 mL/min/{1.73_m2} (ref >60)
GLUCOSE: 82 mg/dL (ref 70–140)
Potassium: 4.3 mEq/L (ref 3.5–5.1)
SODIUM: 141 meq/L (ref 136–145)
TOTAL PROTEIN: 6 g/dL — AB (ref 6.4–8.3)

## 2017-12-12 LAB — CBC WITH DIFFERENTIAL/PLATELET
BASO%: 2.3 % — ABNORMAL HIGH (ref 0.0–2.0)
BASOS ABS: 0.1 10*3/uL (ref 0.0–0.1)
EOS ABS: 0.1 10*3/uL (ref 0.0–0.5)
EOS%: 2.4 % (ref 0.0–7.0)
HCT: 42.3 % (ref 34.8–46.6)
HEMOGLOBIN: 13.7 g/dL (ref 11.6–15.9)
LYMPH%: 52.4 % — ABNORMAL HIGH (ref 14.0–49.7)
MCH: 30.4 pg (ref 25.1–34.0)
MCHC: 32.5 g/dL (ref 31.5–36.0)
MCV: 93.5 fL (ref 79.5–101.0)
MONO#: 0.6 10*3/uL (ref 0.1–0.9)
MONO%: 13.5 % (ref 0.0–14.0)
NEUT#: 1.3 10*3/uL — ABNORMAL LOW (ref 1.5–6.5)
NEUT%: 29.4 % — AB (ref 38.4–76.8)
Platelets: 135 10*3/uL — ABNORMAL LOW (ref 145–400)
RBC: 4.53 10*6/uL (ref 3.70–5.45)
RDW: 15.3 % — AB (ref 11.2–14.5)
WBC: 4.3 10*3/uL (ref 3.9–10.3)
lymph#: 2.2 10*3/uL (ref 0.9–3.3)

## 2017-12-12 LAB — LACTATE DEHYDROGENASE: LDH: 207 U/L (ref 125–245)

## 2017-12-12 NOTE — Telephone Encounter (Signed)
Scheduled appt per 12/17 los - Gave patient AVS and calender per los.  

## 2017-12-13 ENCOUNTER — Encounter: Payer: Self-pay | Admitting: Hematology and Oncology

## 2017-12-13 DIAGNOSIS — L989 Disorder of the skin and subcutaneous tissue, unspecified: Secondary | ICD-10-CM | POA: Insufficient documentation

## 2017-12-13 NOTE — Assessment & Plan Note (Signed)
She had paroxysmal irregular heartbeat I recommend reduced dose ibrutinib I recommend she continues on metoprolol to 25 mg twice a day for symptom management

## 2017-12-13 NOTE — Progress Notes (Signed)
[facility] OFFICE PROGRESS NOTE  Patient Care Team: Kelton Pillar, MD as PCP - General (Family Medicine)  SUMMARY OF ONCOLOGIC HISTORY:   CLL (chronic lymphocytic leukemia) ([place])   11/29/2001 Pathology Results    [phone] Left neck biopsy: LEFT NECK LYMPH NODE, EXCISION: SMALL LYMPHOCYTIC LYMPHOMA/CHRONIC LYMPHOCYTIC LEUKEMIA, SEE COMMENT  COMMENT Sections show effacement of the lymph node architecture by uniform small lymphoid cells displaying chromatin clumping and small to inconspicuous nucleoli. This is associated with numerous proliferation centers (pseudofollicular centers). There is no evidence of high grade transformation. Flow cytometric analysis showed a monoclonal, lambda restricted B cell population expressing pan B cell antigens including CD20 and CD23 with coexpression of CD5 and CD20. 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      01/04/2002 Bone Marrow Biopsy    BONE MARROW, TOUCH IMPRINTS AND CORE BIOPSY: INVOLVEMENT BY SMALL LYMPHOCYTIC LYMPHOMA/CHRONIC LYMPHOCYTIC LEUKEMIA      01/06/2004 Miscellaneous    She was treated for the CLL with Rituxan in 2005/ 2006 and again Jan 2008 thru Feb 2009,with good response, butheld then due to complaints of transient short term memory loss after each RItuxan treatment      10/31/2016 PET scan    Diffuse cervical, axillary, retroperitoneal, and pelvic adenopathy is noted which exhibits very mild increased FDG uptake. I suspect the mild level of increased FDG uptake reflects the inherent hypometabolic state of the tumor as opposed to lack of viable tumor.  2. Brown fat within bilateral supraclavicular and paraspinal fat.      11/12/2016 Imaging    Ct scan showed significant progression of bulky confluent retroperitoneal and bilateral pelvic lymphadenopathy. Mild progression of thoracic lymphadenopathy involving the bilateral axillary, mediastinal and bilateral  hilar chains. 2. New extensive fine perilymphatic distribution nodularity and interlobular septal thickening throughout both lungs, most consistent with lymphangitic tumor, as can be seen in lymphoproliferative disorders. 3. Normal size spleen. 4. Additional findings include aortic atherosclerosis, stable 2.0 cm inferior thyroid isthmus nodule mild sigmoid diverticulosis, mild superior L1 vertebral compression fracture of indeterminate chronicity (new since 03/06/2013), and increased size of moderate midline high ventral abdominal wall hernia containing fat and fluid.      12/31/2016 Pathology Results    Lymph node for lymphoma, left supraclavicular - SMALL LYMPHOCYTIC LYMPHOMA. - SEE ONCOLOGY TABLE. Microscopic Comment LYMPHOMA Histologic type: Non-Hodgkin lymphoma, small lymphocytic type. Grade (if applicable): Low grade. Flow cytometry: Monoclonal, lambda restricted B-cell population expressing pan B-cell antigens including CD20 associated with dim CD5 expression. No CD10 expression is identified ([id number]). Immunohistochemical stains: CD10, CD20, CD3, CD43, CD5, CD79a, and cyclin D1 with appropriate controls. Touch preps/imprints: Predominance of small lymphoid cells with high nuclear cytoplasmic ratio, coarse chromatin and small to inconspicuous nucleoli admixed with larger lymphoid cells to a much lesser extent. Comments: The sections show lymph nodal tissue displaying effacement of the architecture by a lymphoproliferative process characterized by predominance of small lymphoid cells with high nuclear cytoplasmic ratio, round to slightly irregular nuclei, coarse chromatin and small to inconspicuous nucleoli. This is associated with abundant variably sized proliferation centers (pseudofollicular centers) imparting a vaguely nodular pattern. Flow cytometric analysis shows a monoclonal, lambda restricted B-cell population expressing pan B-cell antigens associated with dim CD5 expression. In addition,  immunohistochemical stains were performed and show that the lymphoid cells are predominantly composed of B cells as seen with CD20 and CD79a associated with co-expression with CD43 and weak co-expression with CD5. No significant CD10 or cyclin D1 positivity  is identified. There is admixed T-cell population to a lesser extent as primarily seen with CD3. The findings are consistent with involvement by small lymphocytic lymphoma/chronic lymphocytic leukemia. Clinical correlation is recommended. (BNS:ecj 01/04/2017)      12/31/2016 Surgery    She had excisional biopsy left supraclavicular lymph nodes      02/14/2017 - 05/03/2017 Chemotherapy    The patient had treatment with Gazyva. She has delay in starting treatment due to inability to get insurance approval for ibrutinib.      02/21/2017 Adverse Reaction    Treatment was delayed by 1 week due to pancytopenia      05/30/2017 PET scan    The index left level 5 cervical lymph node measuring 2 cm on today's study previously measured 1.3 cm. The index right level 4 cervical node measures 1.6 cm is on today's study, previously 1.4 cm. The index right axillary node measures 1.3 cm on today's study. Previously 1.1 cm. The index right paratracheal node measures 1.4 cm on the current study. Previously 1.1 cm. Within the abdomen the large retroperitoneal nodal mass measures 9.2 by 4.6 cm. This is compared with 8.7 x 4.8 cm previously. Within the right external iliac lymph node chain there is a 1.3 cm lymph node on today's study. Previously this measured 1 cm. Left external iliac lymph node measures 1.2 cm on today's study. Previously 1 cm.  IMPRESSION: 1. When compared with 02/19/2017 the index lymph nodes within the neck, chest, abdomen and pelvis demonstrate mild increase in size in the interval.      06/17/2017 -  Chemotherapy    She started taking Ibrutinib      09/14/2017 PET scan    1. Reduction in size of the adenopathy in the neck, chest, and  abdomen/pelvis. Generalized slight reduction in the SUV, currently Deauville 2 and Deauville 3. 2. Other imaging findings of potential clinical significance: Mild cardiomegaly. Sub xiphoid hernia containing adipose tissue and a small amount of fluid similar to prior. Aortic Atherosclerosis (ICD10-I70.0).       INTERVAL HISTORY: Please see below for problem oriented charting. She returns with her husband for further follow-up She tolerated treatment well She denies significant palpitation with increased dose of metoprolol She denies diarrhea She had mild bruising She denies recent infection or new lymphadenopathy  REVIEW OF SYSTEMS:   Constitutional: Denies fevers, chills or abnormal weight loss Eyes: Denies blurriness of vision Ears, nose, mouth, throat, and face: Denies mucositis or sore throat Respiratory: Denies cough, dyspnea or wheezes Cardiovascular: Denies palpitation, chest discomfort or lower extremity swelling Gastrointestinal:  Denies nausea, heartburn or change in bowel habits Skin: Denies abnormal skin rashes Lymphatics: Denies new lymphadenopathy Neurological:Denies numbness, tingling or new weaknesses Behavioral/Psych: Mood is stable, no new changes  All other systems were reviewed with the patient and are negative.  I have reviewed the past medical history, past surgical history, social history and family history with the patient and they are unchanged from previous note.  ALLERGIES:  is allergic to gluten meal; influenza virus vacc split pf; nutrasweet aspartame [aspartame]; salicylates cross reactors; and sulfa antibiotics.  MEDICATIONS:  Current Outpatient Medications  Medication Sig Dispense Refill  . acyclovir (ZOVIRAX) 400 MG tablet Take 1 tablet (400 mg total) by mouth daily. 90 tablet 6  . Calcium Carbonate-Vitamin D (CALCIUM 600+D) 600-200 MG-UNIT TABS Take 1 tablet by mouth 2 (two) times daily.    Marland Kitchen guaiFENesin-codeine 100-10 MG/5ML syrup Take 5 mLs by  mouth 3 (three) times daily  as needed for cough. 473 mL 0  . Ibrutinib 280 MG TABS Take 1 tablet by mouth daily. Take with a full glass of water, maintain adequate hydration. 30 tablet 11  . metoprolol tartrate (LOPRESSOR) 25 MG tablet Take 1 tablet (25 mg total) 2 (two) times daily by mouth. 90 tablet 1  . Multiple Vitamin (MULTIVITAMIN WITH MINERALS) TABS tablet Take 1 tablet by mouth daily.    . ondansetron (ZOFRAN ODT) 4 MG disintegrating tablet '4mg'$  ODT q6 hours prn nausea/vomit 8 tablet 0  . PAZEO 0.7 % SOLN Place 1 drop into both eyes daily.  4  . prednisoLONE acetate (PRED FORTE) 1 % ophthalmic suspension Place 1 drop into the right eye 2 (two) times daily.  4   No current facility-administered medications for this visit.     PHYSICAL EXAMINATION: ECOG PERFORMANCE STATUS: 1 - Symptomatic but completely ambulatory  Vitals:   12/12/17 1510  BP: (!) 151/73  Pulse: 69  Resp: 18  Temp: 97.8 F (36.6 C)  SpO2: 99%   Filed Weights   12/12/17 1510  Weight: 134 lb 3.2 oz (60.9 kg)    GENERAL:alert, no distress and comfortable SKIN: skin color, texture, turgor are normal, no rashes or significant lesions. NOted skin lesions EYES: normal, Conjunctiva are pink and non-injected, sclera clear OROPHARYNX:no exudate, no erythema and lips, buccal mucosa, and tongue normal  NECK: supple, thyroid normal size, non-tender, without nodularity LYMPH:  no palpable lymphadenopathy in the cervical, axillary or inguinal LUNGS: clear to auscultation and percussion with normal breathing effort HEART: regular rate & rhythm and no murmurs and no lower extremity edema ABDOMEN:abdomen soft, non-tender and normal bowel sounds Musculoskeletal:no cyanosis of digits and no clubbing  NEURO: alert & oriented x 3 with fluent speech, no focal motor/sensory deficits  LABORATORY DATA:  I have reviewed the data as listed    Component Value Date/Time   NA 141 12/12/2017 1456   K 4.3 12/12/2017 1456   CL 99  (L) 02/19/2017 0951   CL 106 03/21/2013 1302   CO2 29 12/12/2017 1456   GLUCOSE 82 12/12/2017 1456   GLUCOSE 87 03/21/2013 1302   BUN 13.7 12/12/2017 1456   CREATININE 0.7 12/12/2017 1456   CALCIUM 9.1 12/12/2017 1456   PROT 6.0 (L) 12/12/2017 1456   ALBUMIN 3.9 12/12/2017 1456   AST 39 (H) 12/12/2017 1456   ALT 26 12/12/2017 1456   ALKPHOS 78 12/12/2017 1456   BILITOT 0.50 12/12/2017 1456   GFRNONAA >60 02/19/2017 0951   GFRAA >60 02/19/2017 0951    No results found for: SPEP, UPEP  Lab Results  Component Value Date   WBC 4.3 12/12/2017   NEUTROABS 1.3 (L) 12/12/2017   HGB 13.7 12/12/2017   HCT 42.3 12/12/2017   MCV 93.5 12/12/2017   PLT 135 (L) 12/12/2017      Chemistry      Component Value Date/Time   NA 141 12/12/2017 1456   K 4.3 12/12/2017 1456   CL 99 (L) 02/19/2017 0951   CL 106 03/21/2013 1302   CO2 29 12/12/2017 1456   BUN 13.7 12/12/2017 1456   CREATININE 0.7 12/12/2017 1456      Component Value Date/Time   CALCIUM 9.1 12/12/2017 1456   ALKPHOS 78 12/12/2017 1456   AST 39 (H) 12/12/2017 1456   ALT 26 12/12/2017 1456   BILITOT 0.50 12/12/2017 1456          ASSESSMENT & PLAN:  CLL (chronic lymphocytic leukemia) ([place]) PET CT  scan show continued postive response to treatment She tolerated reduced dose ibrutinib well except for occasional palpitation The patient is educated to watch out for severe irregular heartbeat If that happens, she will interrupt her ibrutinib and substitute with prednisone Due to improvement and stability of side effects, I plan to see her back in 3 months with repeat imaging   Thrombocytopenia ([place]) This is likely due to recent treatment. The patient denies recent history of bleeding such as epistaxis, hematuria or hematochezia. She is asymptomatic from the low platelet count. I will observe for now.  Irregular heart beat She had paroxysmal irregular heartbeat I recommend reduced dose ibrutinib I recommend she  continues on metoprolol to 25 mg twice a day for symptom management  Skin lesions She has new skin lesions It could be an embolic phenomenon I will order echocardiogram to assess   Orders Placed This Encounter  Procedures  . NM PET Image Restag (PS) Skull Base To Thigh    Standing Status:   Future    Standing Expiration Date:   12/12/2018    Order Specific Question:   If indicated for the ordered procedure, I authorize the administration of a radiopharmaceutical per Radiology protocol    Answer:   Yes    Order Specific Question:   Preferred imaging location?    Answer:   [facility]    Order Specific Question:   Radiology Contrast Protocol - do NOT remove file path    Answer:   [email or web address]  . ECHOCARDIOGRAM COMPLETE    Standing Status:   Future    Standing Expiration Date:   03/13/2019    Order Specific Question:   Where should this test be performed    Answer:   [facility]    Order Specific Question:   Complete or Limited study?    Answer:   Complete    Order Specific Question:   With Image Enhancing Agent or without Image Enhancing Agent?    Answer:   With Image Enhancing Agent    Order Specific Question:   Reason for exam-Echo    Answer:   Abnormal Heart Sounds Nec  785.3 / R01.2   All questions were answered. The patient knows to call the clinic with any problems, questions or concerns. No barriers to learning was detected. I spent 15 minutes counseling the patient face to face. The total time spent in the appointment was 20 minutes and more than 50% was on counseling and review of test results     Heath Lark, MD 12/13/2017 4:52 PM

## 2017-12-13 NOTE — Assessment & Plan Note (Signed)
This is likely due to recent treatment. The patient denies recent history of bleeding such as epistaxis, hematuria or hematochezia. She is asymptomatic from the low platelet count. I will observe for now.  

## 2017-12-13 NOTE — Assessment & Plan Note (Addendum)
PET CT scan show continued postive response to treatment She tolerated reduced dose ibrutinib well except for occasional palpitation The patient is educated to watch out for severe irregular heartbeat If that happens, she will interrupt her ibrutinib and substitute with prednisone Due to improvement and stability of side effects, I plan to see her back in 3 months with repeat imaging

## 2017-12-13 NOTE — Assessment & Plan Note (Addendum)
She has new skin lesions It could be an embolic phenomenon I will order echocardiogram to assess

## 2017-12-14 ENCOUNTER — Telehealth: Payer: Self-pay

## 2017-12-14 NOTE — Telephone Encounter (Signed)
-----   Message from Heath Lark, MD sent at 12/13/2017  4:50 PM EST ----- Regarding: echo now I ordered ECHO to be done this week not March Can you call?

## 2017-12-14 NOTE — Telephone Encounter (Signed)
Called and left below message. Give office number and ask to call nurse.

## 2017-12-16 ENCOUNTER — Ambulatory Visit (HOSPITAL_COMMUNITY)
Admission: RE | Admit: 2017-12-16 | Discharge: 2017-12-16 | Disposition: A | Discharge status: Home / Self Care | Payer: Medicare Other | Source: Ambulatory Visit | Attending: Hematology and Oncology | Admitting: Hematology and Oncology

## 2017-12-16 ENCOUNTER — Telehealth: Payer: Self-pay

## 2017-12-16 DIAGNOSIS — Z856 Personal history of leukemia: Secondary | ICD-10-CM | POA: Diagnosis not present

## 2017-12-16 DIAGNOSIS — L608 Other nail disorders: Secondary | ICD-10-CM | POA: Diagnosis not present

## 2017-12-16 DIAGNOSIS — R011 Cardiac murmur, unspecified: Secondary | ICD-10-CM | POA: Diagnosis not present

## 2017-12-16 DIAGNOSIS — I499 Cardiac arrhythmia, unspecified: Secondary | ICD-10-CM | POA: Insufficient documentation

## 2017-12-16 DIAGNOSIS — R0609 Other forms of dyspnea: Secondary | ICD-10-CM | POA: Diagnosis not present

## 2017-12-16 NOTE — Telephone Encounter (Signed)
Called with below results. Verbalized understanding.

## 2017-12-16 NOTE — Telephone Encounter (Signed)
-----   Message from Heath Lark, MD sent at 12/16/2017  2:19 PM EST ----- Regarding: ECHO PLs let her know echo result OK ----- Message ----- From: Interface, Rad Results In Sent: 12/16/2017   1:22 PM To: Heath Lark, MD

## 2017-12-16 NOTE — Progress Notes (Signed)
  Echocardiogram 2D Echocardiogram has been performed.  Brenda Terrell 12/16/2017, 9:37 AM

## 2018-01-03 MED FILL — IMBRUVICA 280 MG TAB: 280 | 28 days supply | Qty: 28 | Fill #3

## 2018-01-04 DIAGNOSIS — H20041 Secondary noninfectious iridocyclitis, right eye: Secondary | ICD-10-CM | POA: Diagnosis not present

## 2018-01-04 DIAGNOSIS — H401131 Primary open-angle glaucoma, bilateral, mild stage: Secondary | ICD-10-CM | POA: Diagnosis not present

## 2018-01-04 DIAGNOSIS — H16223 Keratoconjunctivitis sicca, not specified as Sjogren's, bilateral: Secondary | ICD-10-CM | POA: Diagnosis not present

## 2018-01-04 DIAGNOSIS — H04123 Dry eye syndrome of bilateral lacrimal glands: Secondary | ICD-10-CM | POA: Diagnosis not present

## 2018-01-31 MED FILL — IMBRUVICA 280 MG TAB: 280 | 28 days supply | Qty: 28 | Fill #4

## 2018-02-12 ENCOUNTER — Other Ambulatory Visit: Payer: Self-pay

## 2018-02-12 ENCOUNTER — Emergency Department (HOSPITAL_COMMUNITY): Payer: Medicare Other

## 2018-02-12 ENCOUNTER — Inpatient Hospital Stay (HOSPITAL_COMMUNITY)
Admission: EM | Admit: 2018-02-12 | Discharge: 2018-02-15 | DRG: 309 | Disposition: A | Discharge status: Home / Self Care | Payer: Medicare Other | Attending: Internal Medicine | Admitting: Internal Medicine

## 2018-02-12 ENCOUNTER — Encounter (HOSPITAL_COMMUNITY): Payer: Self-pay

## 2018-02-12 DIAGNOSIS — I4891 Unspecified atrial fibrillation: Secondary | ICD-10-CM

## 2018-02-12 DIAGNOSIS — I361 Nonrheumatic tricuspid (valve) insufficiency: Secondary | ICD-10-CM | POA: Diagnosis not present

## 2018-02-12 DIAGNOSIS — C9110 Chronic lymphocytic leukemia of B-cell type not having achieved remission: Secondary | ICD-10-CM | POA: Diagnosis present

## 2018-02-12 DIAGNOSIS — R079 Chest pain, unspecified: Secondary | ICD-10-CM | POA: Diagnosis not present

## 2018-02-12 DIAGNOSIS — Z79899 Other long term (current) drug therapy: Secondary | ICD-10-CM

## 2018-02-12 DIAGNOSIS — I499 Cardiac arrhythmia, unspecified: Secondary | ICD-10-CM | POA: Diagnosis not present

## 2018-02-12 DIAGNOSIS — Z888 Allergy status to other drugs, medicaments and biological substances status: Secondary | ICD-10-CM | POA: Diagnosis not present

## 2018-02-12 DIAGNOSIS — I483 Typical atrial flutter: Secondary | ICD-10-CM | POA: Diagnosis not present

## 2018-02-12 DIAGNOSIS — J189 Pneumonia, unspecified organism: Secondary | ICD-10-CM

## 2018-02-12 DIAGNOSIS — I4892 Unspecified atrial flutter: Secondary | ICD-10-CM

## 2018-02-12 DIAGNOSIS — I480 Paroxysmal atrial fibrillation: Secondary | ICD-10-CM | POA: Diagnosis not present

## 2018-02-12 DIAGNOSIS — I248 Other forms of acute ischemic heart disease: Secondary | ICD-10-CM | POA: Diagnosis present

## 2018-02-12 DIAGNOSIS — R7989 Other specified abnormal findings of blood chemistry: Secondary | ICD-10-CM

## 2018-02-12 DIAGNOSIS — D696 Thrombocytopenia, unspecified: Secondary | ICD-10-CM | POA: Diagnosis not present

## 2018-02-12 DIAGNOSIS — I5032 Chronic diastolic (congestive) heart failure: Secondary | ICD-10-CM | POA: Diagnosis present

## 2018-02-12 DIAGNOSIS — Z887 Allergy status to serum and vaccine status: Secondary | ICD-10-CM

## 2018-02-12 DIAGNOSIS — J90 Pleural effusion, not elsewhere classified: Secondary | ICD-10-CM | POA: Diagnosis not present

## 2018-02-12 DIAGNOSIS — R0602 Shortness of breath: Secondary | ICD-10-CM | POA: Diagnosis not present

## 2018-02-12 DIAGNOSIS — C9190 Lymphoid leukemia, unspecified not having achieved remission: Secondary | ICD-10-CM | POA: Diagnosis not present

## 2018-02-12 DIAGNOSIS — R0989 Other specified symptoms and signs involving the circulatory and respiratory systems: Secondary | ICD-10-CM | POA: Diagnosis not present

## 2018-02-12 DIAGNOSIS — R748 Abnormal levels of other serum enzymes: Secondary | ICD-10-CM | POA: Diagnosis not present

## 2018-02-12 DIAGNOSIS — R778 Other specified abnormalities of plasma proteins: Secondary | ICD-10-CM

## 2018-02-12 DIAGNOSIS — I313 Pericardial effusion (noninflammatory): Secondary | ICD-10-CM | POA: Diagnosis present

## 2018-02-12 DIAGNOSIS — R05 Cough: Secondary | ICD-10-CM | POA: Diagnosis not present

## 2018-02-12 DIAGNOSIS — Z882 Allergy status to sulfonamides status: Secondary | ICD-10-CM | POA: Diagnosis not present

## 2018-02-12 DIAGNOSIS — D61810 Antineoplastic chemotherapy induced pancytopenia: Secondary | ICD-10-CM | POA: Diagnosis not present

## 2018-02-12 HISTORY — DX: Benign and innocent cardiac murmurs: R01.0

## 2018-02-12 HISTORY — DX: Unspecified atrial fibrillation: I48.91

## 2018-02-12 LAB — I-STAT TROPONIN, ED: TROPONIN I, POC: 0.27 ng/mL — AB (ref 0.00–0.08)

## 2018-02-12 LAB — CBC WITH DIFFERENTIAL/PLATELET
BASOS ABS: 0 10*3/uL (ref 0.0–0.1)
Basophils Relative: 0 %
EOS ABS: 0 10*3/uL (ref 0.0–0.7)
EOS PCT: 0 %
HCT: 42.4 % (ref 36.0–46.0)
Hemoglobin: 14.4 g/dL (ref 12.0–15.0)
LYMPHS PCT: 25 %
Lymphs Abs: 1.5 10*3/uL (ref 0.7–4.0)
MCH: 30.9 pg (ref 26.0–34.0)
MCHC: 34 g/dL (ref 30.0–36.0)
MCV: 91 fL (ref 78.0–100.0)
Monocytes Absolute: 1.3 10*3/uL — ABNORMAL HIGH (ref 0.1–1.0)
Monocytes Relative: 22 %
Neutro Abs: 3.1 10*3/uL (ref 1.7–7.7)
Neutrophils Relative %: 53 %
PLATELETS: 142 10*3/uL — AB (ref 150–400)
RBC: 4.66 MIL/uL (ref 3.87–5.11)
RDW: 14.8 % (ref 11.5–15.5)
WBC: 5.9 10*3/uL (ref 4.0–10.5)

## 2018-02-12 LAB — BASIC METABOLIC PANEL
Anion gap: 8 (ref 5–15)
BUN: 12 mg/dL (ref 6–20)
CO2: 27 mmol/L (ref 22–32)
CREATININE: 0.67 mg/dL (ref 0.44–1.00)
Calcium: 9 mg/dL (ref 8.9–10.3)
Chloride: 102 mmol/L (ref 101–111)
Glucose, Bld: 108 mg/dL — ABNORMAL HIGH (ref 65–99)
POTASSIUM: 3.8 mmol/L (ref 3.5–5.1)
SODIUM: 137 mmol/L (ref 135–145)

## 2018-02-12 LAB — INFLUENZA PANEL BY PCR (TYPE A & B)
INFLBPCR: NEGATIVE
Influenza A By PCR: NEGATIVE

## 2018-02-12 LAB — TROPONIN I: TROPONIN I: 0.2 ng/mL — AB (ref <0.03)

## 2018-02-12 LAB — D-DIMER, QUANTITATIVE (NOT AT ARMC): D DIMER QUANT: 0.53 ug{FEU}/mL — AB (ref 0.00–0.50)

## 2018-02-12 MED ORDER — DILTIAZEM HCL-DEXTROSE 100-5 MG/100ML-% IV SOLN (PREMIX)
5.0000 mg/h | Freq: Once | INTRAVENOUS | Status: AC
  Administered 2018-02-12: 5 mg/h via INTRAVENOUS
  Filled 2018-02-12: qty 100

## 2018-02-12 MED ORDER — IOPAMIDOL (ISOVUE-370) INJECTION 76%
INTRAVENOUS | Status: AC
  Administered 2018-02-12: 100 mL via INTRAVENOUS
  Filled 2018-02-12: qty 100

## 2018-02-12 MED ORDER — SODIUM CHLORIDE 0.9 % IV SOLN
500.0000 mg | Freq: Once | INTRAVENOUS | Status: AC
  Administered 2018-02-13: 500 mg via INTRAVENOUS
  Filled 2018-02-12: qty 500

## 2018-02-12 MED ORDER — CEFTRIAXONE SODIUM 1 G IJ SOLR
1.0000 g | Freq: Once | INTRAMUSCULAR | Status: AC
  Administered 2018-02-12: 1 g via INTRAVENOUS
  Filled 2018-02-12: qty 10

## 2018-02-12 MED ORDER — ALBUTEROL SULFATE (2.5 MG/3ML) 0.083% IN NEBU
5.0000 mg | INHALATION_SOLUTION | Freq: Once | RESPIRATORY_TRACT | Status: AC
  Administered 2018-02-12: 5 mg via RESPIRATORY_TRACT
  Filled 2018-02-12: qty 6

## 2018-02-12 NOTE — ED Notes (Signed)
UNSUCCESSFUL COLLECTION ATTEMPT FOR D-DIMER

## 2018-02-12 NOTE — ED Triage Notes (Signed)
She c/o cough/congestion x 2 days. She is in no distress and is here with her husband.

## 2018-02-12 NOTE — Progress Notes (Signed)
MEDICATION RELATED CONSULT NOTE - FOLLOW UP   Pharmacy Consult for Admission counceling Indication: Drug interaction with azithromycin and diltiazem  Allergies  Allergen Reactions  . Gluten Meal Other (See Comments)    Gi upset/bloating  . Influenza Virus Vacc Split Pf     DOES NOT TAKE DUE TO ALLERGIC REACTIONS IN THE PAST- pt states she got very sick from this & her oncologist recommended she no longer get it  . Nutrasweet Aspartame [Aspartame]   . Salicylates Cross Reactors   . Sulfa Antibiotics Swelling    Swelling and rash    Patient Measurements: Height: 5' (152.4 cm) Weight: 134 lb (60.8 kg) IBW/kg (Calculated) : 45.5 Adjusted Body Weight:   Vital Signs: Temp: 98.6 F (37 C) (02/17 1359) Temp Source: Oral (02/17 1359) BP: 119/46 (02/17 2300) Pulse Rate: 71 (02/17 2300) Intake/Output from previous day: No intake/output data recorded. Intake/Output from this shift: No intake/output data recorded.  Labs: Recent Labs    02/12/18 1659  WBC 5.9  HGB 14.4  HCT 42.4  PLT 142*  CREATININE 0.67   Estimated Creatinine Clearance: 48 mL/min (by C-G formula based on SCr of 0.67 mg/dL).   Microbiology: No results found for this or any previous visit (from the past 720 hour(s)).  Medications:   (Not in a hospital admission) Scheduled:   Infusions:  . azithromycin    . cefTRIAXone (ROCEPHIN)  IV      Assessment: Patient on diltiazem drip and azithromycin.  ED PA asked pharmacy to check for drug interactions for azithromycin and diltiazem.  Goal of Therapy:  Safe and effective use of both azithromycin and diltiazem  Plan:  I found no noted drug-drug interactions between azithromycin and diltiazem  Tyler Deis, Shea Stakes [place] 02/12/2018,11:32 PM

## 2018-02-12 NOTE — ED Notes (Signed)
Did not answer when called

## 2018-02-12 NOTE — ED Provider Notes (Signed)
[facility]-ICU/STEPDOWN Provider Note   CSN: [id number] Arrival date & time: 02/12/18  1315     History   Chief Complaint Chief Complaint  Patient presents with  . URI  . Cancer    HPI Brenda Terrell is a 77 y.o. female with PMH/o CLL, GERD, who presents for evaluation of 2 days of intermittent chest pain, difficulty breathing, cough, congestion, rhinorrhea.  Patient states that she has had some intermittent coughing that is dry.  She states that it felt like it was congested up in her chest and states that she would sometimes have some generalized discomfort in her chest.  She does have does have a history of asthma but states that she has not had any wheezing.  She has been taking OTC cough syrup with improvement in cough.  Patient reports that she felt like she was having some subjective fever chills but did not actually measure a temperature.  Patient reports 2 episodes of vomiting at onset of symptoms but states that has since improved.  She has been able to tolerate p.o. without any difficulty today.  Patient is currently on oral chemotherapy.  She takes the medication daily.  She is not on any infusion therapy.  Patient denies any chest pain, difficulty breathing, abdominal pain, urinary complaints.  She did get an influenza vaccine this year.  Patient is currently undergoing treatment for CLL.  She reports that she is on p.o. chemotherapy that she takes daily.  She does not take infusion chemo.   The history is provided by the patient.    Past Medical History:  Diagnosis Date  . Arthritis    HIP  . Asthma   . Atrial flutter ([place]) 02/12/2018  . Blood transfusion    POST D&C  . 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 Complication of anesthesia   . GERD (gastroesophageal reflux disease)   . Goiter   . History of migraines   . Mitral valve prolapse   . PONV (postoperative nausea and vomiting)       Patient Active Problem List   Diagnosis Date Noted  . Atrial flutter ([place]) 02/12/2018  . Elevated troponin 02/12/2018  . Skin lesions 12/13/2017  . Splinter hemorrhage of fingernail 12/12/2017  . Dyspnea 12/12/2017  . Sinusitis, acute 09/12/2017  . Pancytopenia, acquired ([place]) 09/12/2017  . Irregular heart beat 07/11/2017  . Elevated liver enzymes 04/05/2017  . Drug-induced neutropenia ([place]) 03/21/2017  . Protein-calorie malnutrition, moderate ([place]) 03/21/2017  . Leukopenia due to antineoplastic chemotherapy ([place]) 02/28/2017  . Anemia due to antineoplastic chemotherapy 02/28/2017  . Thrombocytopenia ([place]) 01/24/2017  . Goals of care, counseling/discussion 01/06/2017  . Bilateral leg edema 01/06/2017  . Atherosclerosis of aorta ([place]) 11/24/2016  . Compression fracture of L1 lumbar vertebra, sequela 11/24/2016  . Unintentional weight loss 11/14/2016  . 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 Immunocompromised ([place]) 05/16/2016  . Multiple environmental allergies 02/14/2016  . Dense breast tissue 02/14/2015  . CLL (chronic lymphocytic leukemia) ([place]) 07/05/2012  . Leukopenia 07/05/2012  . 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 FOOT SURGERY     bilateral  . LYMPH NODE BIOPSY Left 12/31/2016   Procedure: LYMPH NODE BIOPSY LEFT SUPRACLIAVICULAR;  Surgeon: Clovis Riley, MD;  Location: [facility] ORS;  Service: General;  Laterality: Left;  . TONSILLECTOMY      OB History    No data available  Home Medications    Prior to Admission medications   Medication Sig Start Date End Date Taking? Authorizing Provider  acyclovir (ZOVIRAX) 400 MG tablet Take 1 tablet (400 mg total) by mouth daily. 10/06/17  Yes Gorsuch, Ernst Spell, MD  Calcium Carbonate-Vitamin D (CALCIUM 600+D) 600-200 MG-UNIT TABS Take 1 tablet by mouth 2 (two) times daily.   Yes [provider]  guaiFENesin-codeine 100-10 MG/5ML syrup Take 5 mLs by mouth 3  (three) times daily as needed for cough. 09/12/17  Yes Gorsuch, Ni, MD  Ibrutinib 280 MG TABS Take 1 tablet by mouth daily. Take with a full glass of water, maintain adequate hydration. 10/06/17  Yes Gorsuch, Ni, MD  metoprolol tartrate (LOPRESSOR) 25 MG tablet Take 1 tablet (25 mg total) 2 (two) times daily by mouth. 10/31/17  Yes Heath Lark, MD  Multiple Vitamin (MULTIVITAMIN WITH MINERALS) TABS tablet Take 1 tablet by mouth daily.   Yes [provider]  ondansetron (ZOFRAN ODT) 4 MG disintegrating tablet 4mg  ODT q6 hours prn nausea/vomit 02/19/17  Yes Drenda Freeze, MD  prednisoLONE acetate (PRED FORTE) 1 % ophthalmic suspension Place 1 drop into the right eye 2 (two) times daily. 03/31/17  Yes [provider]    Family History Family History  Problem Relation Age of Onset  . Cancer Mother 19       breast ca  . Cancer Brother        bone cancer    Social History Social History   Tobacco Use  . Smoking status: Never Smoker  . Smokeless tobacco: Never Used  Substance Use Topics  . Alcohol use: No  . Drug use: No     Allergies   Gluten meal; Influenza virus vacc split pf; Nutrasweet aspartame [aspartame]; Salicylates cross reactors; and Sulfa antibiotics   Review of Systems Review of Systems  Constitutional: Positive for chills and fever (subjective).  HENT: Positive for congestion and rhinorrhea.   Respiratory: Positive for cough and shortness of breath.   Cardiovascular: Positive for chest pain.  Gastrointestinal: Negative for abdominal pain, diarrhea, nausea and vomiting.  Genitourinary: Negative for dysuria and hematuria.  Skin: Negative for rash.  Neurological: Negative for dizziness, weakness, numbness and headaches.  All other systems reviewed and are negative.    Physical Exam Updated Vital Signs BP 119/83 (BP Location: Right Arm)   Pulse 90   Temp 98.6 F (37 C) (Oral)   Resp (!) 24   Ht 5' (1.524 m)   Wt 60.8 kg (134 lb)   SpO2 96%    BMI 26.17 kg/m   Physical Exam  Constitutional: She is oriented to person, place, and time. She appears well-developed and well-nourished.  HENT:  Head: Normocephalic and atraumatic.  Nose: Mucosal edema present.  Mouth/Throat: Uvula is midline, oropharynx is clear and moist and mucous membranes are normal.  Eyes: Conjunctivae, EOM and lids are normal. Pupils are equal, round, and reactive to light.  Neck: Full passive range of motion without pain.  Cardiovascular: Normal rate, regular rhythm and normal pulses. Exam reveals no gallop and no friction rub.  No murmur heard. Pulmonary/Chest: Effort normal and breath sounds normal. She has no wheezes.  Slight rales noted to the upper lung fields. No evidence of respiratory distress. Able to speak in full sentences without difficulty.  Abdominal: Soft. Normal appearance. There is no tenderness. There is no rigidity and no guarding.  Musculoskeletal: Normal range of motion.  Bilateral lower extremities are symmetric in appearance.   Neurological: She  is alert and oriented to person, place, and time.  Skin: Skin is warm and dry. Capillary refill takes less than 2 seconds.  Psychiatric: She has a normal mood and affect. Her speech is normal.  Nursing note and vitals reviewed.    ED Treatments / Results  Labs (all labs ordered are listed, but only abnormal results are displayed) Labs Reviewed  BASIC METABOLIC PANEL - Abnormal; Notable for the following components:      Result Value   Glucose, Bld 108 (*)    All other components within normal limits  CBC WITH DIFFERENTIAL/PLATELET - Abnormal; Notable for the following components:   Platelets 142 (*)    Monocytes Absolute 1.3 (*)    All other components within normal limits  TROPONIN I - Abnormal; Notable for the following components:   Troponin I 0.20 (*)    All other components within normal limits  D-DIMER, QUANTITATIVE (NOT AT [facility]) - Abnormal; Notable for the following components:    D-Dimer, Quant 0.53 (*)    All other components within normal limits  I-STAT TROPONIN, ED - Abnormal; Notable for the following components:   Troponin i, poc 0.27 (*)    All other components within normal limits  MRSA PCR SCREENING  INFLUENZA PANEL BY PCR (TYPE A & B)  BASIC METABOLIC PANEL  CBC  BRAIN NATRIURETIC PEPTIDE  TROPONIN I  TROPONIN I  TROPONIN I    EKG  EKG Interpretation None       Radiology Dg Chest 2 View  Result Date: 02/12/2018 CLINICAL DATA:  Chest tightness with deep inspiration and difficulty breathing. EXAM: CHEST  2 VIEW COMPARISON:  09/14/2017 FINDINGS: Aortic atherosclerosis. Moderate cardiac enlargement. Small bilateral pleural effusions identified. Left midlung and right base scar versus atelectasis. IMPRESSION: 1. Cardiac enlargement and small pleural effusion 2.  Aortic Atherosclerosis (ICD10-I70.0). 3. Left midlung and right base atelectasis versus scar. Electronically Signed   By: Kerby Moors M.D.   On: 02/12/2018 16:46   Ct Angio Chest Pe W And/or Wo Contrast  Result Date: 02/12/2018 CLINICAL DATA:  Cough congestion EXAM: CT ANGIOGRAPHY CHEST WITH CONTRAST TECHNIQUE: Multidetector CT imaging of the chest was performed using the standard protocol during bolus administration of intravenous contrast. Multiplanar CT image reconstructions and MIPs were obtained to evaluate the vascular anatomy. CONTRAST:  100 mL Isovue 370 intravenous COMPARISON:  Radiograph 02/12/2018, PET-CT 09/14/2017, CT thorax 02/19/2017 FINDINGS: Cardiovascular: Satisfactory opacification of the pulmonary arteries to the segmental level. No evidence of pulmonary embolism. Limited evaluation for pulmonary emboli in the bilateral lower lobes due to respiratory motion artifact. Nonaneurysmal aorta. Mild atherosclerotic calcification. Mild cardiomegaly. Development of small moderate pericardial effusion, measuring up to 12 mm in thickness on the left side. Reflux of contrast into the  hepatic veins. Mediastinum/Nodes: Midline trachea. No thyroid mass. Scattered nodes in the mediastinum but not increased since comparison PET-CT, right low paratracheal lymph node measures 11 mm compared to 13 mm previously. Esophagus within normal limits. Lungs/Pleura: Small bilateral pleural effusions. Patchy consolidation at the left lower lobe with streaky densities in the lingula. Negative for a pneumothorax. Upper Abdomen: No acute abnormality. 2.4 cm fluid collection in the subcutaneous soft tissues of the anterior abdominal wall noted on prior exams. Musculoskeletal: Degenerative changes. No acute or suspicious abnormality Review of the MIP images confirms the above findings. IMPRESSION: 1. Limited evaluation for emboli in the lower lobes due to respiratory motion artifact. No acute embolus is seen. Negative for aortic dissection. 2. Cardiomegaly. Development of  small moderate pericardial effusion, measuring up to 12 mm in maximum thickness. Trace bilateral pleural effusions. Partial consolidation in the left lower lobe and patchy density in the lingula which may reflect atelectasis or mild pneumonia. 3. No new or enlarging adenopathy since most recent comparison PET-CT September 2018. Aortic Atherosclerosis (ICD10-I70.0). Electronically Signed   By: Donavan Foil M.D.   On: 02/12/2018 22:45    Procedures Procedures (including critical care time)  Medications Ordered in ED Medications  acyclovir (ZOVIRAX) tablet 400 mg (not administered)  calcium-vitamin D (OSCAL WITH D) 500-200 MG-UNIT per tablet 1 tablet (1 tablet Oral Given 02/13/18 0143)  guaiFENesin-codeine 100-10 MG/5ML solution 5 mL (not administered)  metoprolol tartrate (LOPRESSOR) tablet 25 mg (25 mg Oral Given 02/13/18 0144)  multivitamin with minerals tablet 1 tablet (not administered)  ondansetron (ZOFRAN-ODT) disintegrating tablet 4 mg (not administered)  prednisoLONE acetate (PRED FORTE) 1 % ophthalmic suspension 1 drop (1 drop  Right Eye Given 02/13/18 0144)  sodium chloride flush (NS) 0.9 % injection 3 mL (3 mLs Intravenous Not Given 02/13/18 0154)  acetaminophen (TYLENOL) tablet 650 mg (not administered)    Or  acetaminophen (TYLENOL) suppository 650 mg (not administered)  oxyCODONE (Oxy IR/ROXICODONE) immediate release tablet 5 mg (not administered)  polyethylene glycol (MIRALAX / GLYCOLAX) packet 17 g (not administered)  ondansetron (ZOFRAN) tablet 4 mg (4 mg Oral Given 02/13/18 0144)    Or  ondansetron (ZOFRAN) injection 4 mg ( Intravenous See Alternative 02/13/18 0144)  enoxaparin (LOVENOX) injection 60 mg (60 mg Subcutaneous Given 02/13/18 0144)  diltiazem (CARDIZEM) 100 mg in dextrose 5% 125mL (1 mg/mL) infusion (7.5 mg/hr Intravenous Rate/Dose Change 02/13/18 0138)  albuterol (PROVENTIL) (2.5 MG/3ML) 0.083% nebulizer solution 5 mg (5 mg Nebulization Given 02/12/18 2018)  iopamidol (ISOVUE-370) 76 % injection (  Canceled Entry 02/13/18 0124)  diltiazem (CARDIZEM) 100 mg in dextrose 5% 158mL (1 mg/mL) infusion (5 mg/hr Intravenous Transfusing/Transfer 02/13/18 0030)  cefTRIAXone (ROCEPHIN) 1 g in sodium chloride 0.9 % 100 mL IVPB (0 g Intravenous Stopped 02/13/18 0029)  azithromycin (ZITHROMAX) 500 mg in sodium chloride 0.9 % 250 mL IVPB (500 mg Intravenous Transfusing/Transfer 02/13/18 0109)     Initial Impression / Assessment and Plan / ED Course  I have reviewed the triage vital signs and the nursing notes.  Pertinent labs & imaging results that were available during my care of the patient were reviewed by me and considered in my medical decision making (see chart for details).     77 year old female who presents for value of 2 days of chest discomfort, SOB, nasal congestion, rhinorrhea, subjective fever and chills. No abdominal pain.  Had one episode of vomiting but since resolved.  She did get a flu vaccine this year.  Patient is afebrile on initial ED arrival.  Vital signs stable.  Consider upper respiratory  infection versus pneumonia vs ACS etiology. Given history of cancer, also consider PE.  Plan to check influenza, chest x-ray and basic labs. EKG ordered at triage.   CBC is without any acute abnormalities.  Flu is negative.  BMP is unremarkable.  Chest x-ray is without any abnormality.  D-dimer and troponin still pending. EKG shows sinus rhythm. When compared to EKG in July 2018, appears similar.   Discussed with attending after independent evaluation.  Patient seems to have some increased work of breathing.  There is some noted end expiratory wheezing.  Will plan for DuoNeb here in the department.  Troponin is slightly elevated.  D-dimer is elevated also.  Unclear if this is cardiac or secondary to PE.  Will plan to get CTA chest for further evaluation.   On reevaluation, patient has increased work of breathing.  Patient required 3 L of oxygen to maintain 96% oxygen saturation.  On my repeat evaluation, patient appeared to be tachycardic which she had not previously been.  Evaluation of cardiac monitor, patient appear to be irregular.  Repeat heart exam shows irregular tachycardia.  I repeated her EKG which was concerning for A. fib with RVR.  Patient does not have a history of A. fib but does have this with diagnosis of irregular heartbeat that was made by her Heme/onc doctor.  Patient reports that she has not seen cardiology for evaluation of irregular heartbeat.  She is not currently on any blood thinners.  Patient reports that she has a history of irregular heartbeat that was noticed by her oncology doctor in July 2018.  Patient reports that she is not in an irregular heartbeat all the time but does not know when she goes in and out of it.  Concerned that patient's symptoms may have been intermittent episodes of a flutter or A. fib which was causing patient to be symptomatic.  CTA chest reviewed.  Negative for any acute pulmonary embolism.  There is mention of a small pericardial effusion.   Additionally, there is concern for some atelectasis versus infiltrate in the lingula.  It may be concerning for pneumonia.  Given lack of PE and positive troponin, will plan to consult cardiology for further evaluation.  Discussed patient with Dr. Hassell Done (Cardiology).  Feels that patient's A. fib may be secondary to pneumonia.  Additionally, patient's troponin may be elevated secondary to A. fib.  Recommends medical admission for cardiac enzymes.  Recommend starting patient on antibiotic therapy and continuing monitor of irregular heartbeat.  Will plan to consult if needed.  Discussed with hospitalist.  Will plan to admit.  Final Clinical Impressions(s) / ED Diagnoses   Final diagnoses:  Community acquired pneumonia, unspecified laterality  Irregular heart beat    ED Discharge Orders    None       Desma Mcgregor 02/13/18 1149    Julianne Rice, MD 02/13/18 [phone]

## 2018-02-12 NOTE — ED Notes (Signed)
HOSPITALIST  at bedside. 

## 2018-02-12 NOTE — ED Notes (Signed)
Bed: WA03 Expected date:  Expected time:  Means of arrival:  Comments: 

## 2018-02-12 NOTE — H&P (Signed)
History and Physical    Brenda Terrell DOB: [birth year] DOA: 02/12/2018  PCP: Kelton Pillar, MD   Patient coming from: Home    Chief Complaint: Shortness of breath  HPI: Brenda Terrell is a 77 y.o. 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  Patient reports that she was doing well until 2 days ago when she began to have pain in her bilateral neck.  This pain was sharp and radiated down to her chest which was dull.  She also had one episode of nonbilious nonbloody emesis.  She also noted intermittent significant palpitations since that time.  She began to have progressive shortness of breath both at rest and on exertion.  She also endorsed significant orthopnea but denies any paroxysmal nocturnal dyspnea.  She has chronic bilateral lower extremity swelling.  She also reported a dry cough and some nasal problems but no congestion, rhinorrhea, diarrhea, abdominal pain, rash. Of notePatient was put on metoprolol by her oncologist as she had episodes of palpitations on a bruit neb.  She did have an echo on 12/16/2017 which was fairly unremarkable.  ED Course: In the ED patient was noted to have atrial flutter with a rate of up to 120.  Patient was placed on diltiazem drip.  Labs are notable for platelets of 142.  Troponin was elevated to 0.  2.  D-dimer was elevated.  CT showed partial consolidation of the left lower lobe and patchy density along the lingula which could represent atelectasis versus pneumonia.  Also noted was a small to moderate pericardial effusion.  Patient was admitted for atrial flutter, pneumonia, elevated troponin.  Cardiology was consulted and felt that possibly pneumonia had flipped patient into a flutter.  Patient was given IV ceftriaxone and azithromycin.  Review of Systems: As per HPI otherwise 10 point review of systems negative.    Past Medical  History:  Diagnosis Date  . Arthritis    HIP  . Asthma   . Atrial flutter ([place]) 02/12/2018  . Blood transfusion    POST D&C  . 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 Complication of anesthesia   . GERD (gastroesophageal reflux disease)   . Goiter   . History of migraines   . Mitral valve prolapse   . PONV (postoperative nausea and vomiting)     Past Surgical History:  Procedure Laterality Date  . FOOT SURGERY     bilateral  . LYMPH NODE BIOPSY Left 12/31/2016   Procedure: LYMPH NODE BIOPSY LEFT SUPRACLIAVICULAR;  Surgeon: Clovis Riley, MD;  Location: [facility];  Service: General;  Laterality: Left;  . TONSILLECTOMY       reports that  has never smoked. she has never used smokeless tobacco. She reports that she does not drink alcohol or use drugs.  Allergies  Allergen Reactions  . Gluten Meal Other (See Comments)    Gi upset/bloating  . Influenza Virus Vacc Split Pf     DOES NOT TAKE DUE TO ALLERGIC REACTIONS IN THE PAST- pt states she got very sick from this & her oncologist recommended she no longer get it  . Nutrasweet Aspartame [Aspartame]   . Salicylates Cross Reactors   . Sulfa Antibiotics Swelling    Swelling and rash    Family History  Problem Relation Age of Onset  . Cancer Mother 57  breast ca  . Cancer Brother        bone cancer     Prior to Admission medications   Medication Sig Start Date End Date Taking? Authorizing Provider  acyclovir (ZOVIRAX) 400 MG tablet Take 1 tablet (400 mg total) by mouth daily. 10/06/17  Yes Gorsuch, Ernst Spell, MD  Calcium Carbonate-Vitamin D (CALCIUM 600+D) 600-200 MG-UNIT TABS Take 1 tablet by mouth 2 (two) times daily.   Yes [provider]  guaiFENesin-codeine 100-10 MG/5ML syrup Take 5 mLs by mouth 3 (three) times daily as needed for cough. 09/12/17  Yes Gorsuch, Ni, MD  Ibrutinib 280 MG TABS Take 1 tablet by mouth daily. Take with a full glass of water,  maintain adequate hydration. 10/06/17  Yes Gorsuch, Ni, MD  metoprolol tartrate (LOPRESSOR) 25 MG tablet Take 1 tablet (25 mg total) 2 (two) times daily by mouth. 10/31/17  Yes Heath Lark, MD  Multiple Vitamin (MULTIVITAMIN WITH MINERALS) TABS tablet Take 1 tablet by mouth daily.   Yes [provider]  ondansetron (ZOFRAN ODT) 4 MG disintegrating tablet 4mg  ODT q6 hours prn nausea/vomit 02/19/17  Yes Drenda Freeze, MD  prednisoLONE acetate (PRED FORTE) 1 % ophthalmic suspension Place 1 drop into the right eye 2 (two) times daily. 03/31/17  Yes [provider]    Physical Exam: Vitals:   02/12/18 2200 02/12/18 2230 02/12/18 2245 02/12/18 2300  BP: (!) 107/39 (!) 126/96  (!) 119/46  Pulse: 93 67 67 71  Resp: (!) 31 (!) 28 (!) 24 15  Temp:      TempSrc:      SpO2: 94% 95% 97% 96%  Weight:      Height:        Constitutional: NAD, calm, comfortable Vitals:   02/12/18 2200 02/12/18 2230 02/12/18 2245 02/12/18 2300  BP: (!) 107/39 (!) 126/96  (!) 119/46  Pulse: 93 67 67 71  Resp: (!) 31 (!) 28 (!) 24 15  Temp:      TempSrc:      SpO2: 94% 95% 97% 96%  Weight:      Height:       Eyes: Anicteric sclera ENMT: Moist mucous membranes Neck: normal, supple Respiratory: clear to auscultation bilaterally, no wheezing, no crackles. Normal respiratory effort. No accessory muscle use.  Cardiovascular: Irregularly irregular, 2 out of 6 systolic murmur heard across precordium Abdomen: no tenderness, no masses palpated. No hepatosplenomegaly. Bowel sounds positive.  Musculoskeletal: Trace lower extremity edema Skin: no rashes on visible skin Neurologic: Intact moving all extremities Psychiatric: Normal judgment and insight. Alert and oriented x 3. Normal mood.     Labs on Admission: I have personally reviewed following labs and imaging studies  CBC: Recent Labs  Lab 02/12/18 1659  WBC 5.9  NEUTROABS 3.1  HGB 14.4  HCT 42.4  MCV 91.0  PLT 338*   Basic Metabolic  Panel: Recent Labs  Lab 02/12/18 1659  NA 137  K 3.8  CL 102  CO2 27  GLUCOSE 108*  BUN 12  CREATININE 0.67  CALCIUM 9.0   GFR: Estimated Creatinine Clearance: 48 mL/min (by C-G formula based on SCr of 0.67 mg/dL). Liver Function Tests: No results for input(s): AST, ALT, ALKPHOS, BILITOT, PROT, ALBUMIN in the last 168 hours. No results for input(s): LIPASE, AMYLASE in the last 168 hours. No results for input(s): AMMONIA in the last 168 hours. Coagulation Profile: No results for input(s): INR, PROTIME in the last 168 hours. Cardiac Enzymes: Recent Labs  Lab 02/12/18  2002  TROPONINI 0.20*   BNP (last 3 results) No results for input(s): PROBNP in the last 8760 hours. HbA1C: No results for input(s): HGBA1C in the last 72 hours. CBG: No results for input(s): GLUCAP in the last 168 hours. Lipid Profile: No results for input(s): CHOL, HDL, LDLCALC, TRIG, CHOLHDL, LDLDIRECT in the last 72 hours. Thyroid Function Tests: No results for input(s): TSH, T4TOTAL, FREET4, T3FREE, THYROIDAB in the last 72 hours. Anemia Panel: No results for input(s): VITAMINB12, FOLATE, FERRITIN, TIBC, IRON, RETICCTPCT in the last 72 hours. Urine analysis:    Component Value Date/Time   COLORURINE YELLOW 02/19/2017 1112   APPEARANCEUR CLEAR 02/19/2017 1112   LABSPEC 1.003 (L) 02/19/2017 1112   PHURINE 6.0 02/19/2017 1112   GLUCOSEU NEGATIVE 02/19/2017 1112   HGBUR SMALL (A) 02/19/2017 1112   BILIRUBINUR NEGATIVE 02/19/2017 1112   KETONESUR NEGATIVE 02/19/2017 1112   PROTEINUR NEGATIVE 02/19/2017 1112   NITRITE NEGATIVE 02/19/2017 1112   LEUKOCYTESUR NEGATIVE 02/19/2017 1112    Radiological Exams on Admission: Dg Chest 2 View  Result Date: 02/12/2018 CLINICAL DATA:  Chest tightness with deep inspiration and difficulty breathing. EXAM: CHEST  2 VIEW COMPARISON:  09/14/2017 FINDINGS: Aortic atherosclerosis. Moderate cardiac enlargement. Small bilateral pleural effusions identified. Left midlung  and right base scar versus atelectasis. IMPRESSION: 1. Cardiac enlargement and small pleural effusion 2.  Aortic Atherosclerosis (ICD10-I70.0). 3. Left midlung and right base atelectasis versus scar. Electronically Signed   By: Kerby Moors M.D.   On: 02/12/2018 16:46   Ct Angio Chest Pe W And/or Wo Contrast  Result Date: 02/12/2018 CLINICAL DATA:  Cough congestion EXAM: CT ANGIOGRAPHY CHEST WITH CONTRAST TECHNIQUE: Multidetector CT imaging of the chest was performed using the standard protocol during bolus administration of intravenous contrast. Multiplanar CT image reconstructions and MIPs were obtained to evaluate the vascular anatomy. CONTRAST:  100 mL Isovue 370 intravenous COMPARISON:  Radiograph 02/12/2018, PET-CT 09/14/2017, CT thorax 02/19/2017 FINDINGS: Cardiovascular: Satisfactory opacification of the pulmonary arteries to the segmental level. No evidence of pulmonary embolism. Limited evaluation for pulmonary emboli in the bilateral lower lobes due to respiratory motion artifact. Nonaneurysmal aorta. Mild atherosclerotic calcification. Mild cardiomegaly. Development of small moderate pericardial effusion, measuring up to 12 mm in thickness on the left side. Reflux of contrast into the hepatic veins. Mediastinum/Nodes: Midline trachea. No thyroid mass. Scattered nodes in the mediastinum but not increased since comparison PET-CT, right low paratracheal lymph node measures 11 mm compared to 13 mm previously. Esophagus within normal limits. Lungs/Pleura: Small bilateral pleural effusions. Patchy consolidation at the left lower lobe with streaky densities in the lingula. Negative for a pneumothorax. Upper Abdomen: No acute abnormality. 2.4 cm fluid collection in the subcutaneous soft tissues of the anterior abdominal wall noted on prior exams. Musculoskeletal: Degenerative changes. No acute or suspicious abnormality Review of the MIP images confirms the above findings. IMPRESSION: 1. Limited evaluation  for emboli in the lower lobes due to respiratory motion artifact. No acute embolus is seen. Negative for aortic dissection. 2. Cardiomegaly. Development of small moderate pericardial effusion, measuring up to 12 mm in maximum thickness. Trace bilateral pleural effusions. Partial consolidation in the left lower lobe and patchy density in the lingula which may reflect atelectasis or mild pneumonia. 3. No new or enlarging adenopathy since most recent comparison PET-CT September 2018. Aortic Atherosclerosis (ICD10-I70.0). Electronically Signed   By: Donavan Foil M.D.   On: 02/12/2018 22:45    EKG: Independently reviewed.  Atrial flutter rate of 113, no acute  ST segment changes  Assessment/Plan Principal Problem:   Elevated troponin Active Problems:   CLL (chronic lymphocytic leukemia) (HCC)   Atrial flutter (HCC)   ##) Atrial flutter: It is unclear how long patient has had atrial flutter and likely atrial fibrillation.  The likely causes include ibrutinib in it of itself, a pneumonia triggering atrial fibrillation/flutter or possibly pericarditis causing a pericardial effusion, elevated troponin and flutter. (Blood 2016 128:138-140;) -Diltiazem drip -Enoxaparin 1mg /kg q12h, will transition to direct oral anticoagulant depending on cardiology recommendations -Continue home metoprolol tartrate 25 mg twice daily, titrate up as tolerated for heart rate control -Cardiology consult appreciate recommendations - Echo ordered to evaluate for pericardial effusion and new onset atrial flutter next line -telemetry bed -Magnesium greater than 2, potassium greater than 4 -Discuss with oncology, consider starting oral prednisone -Hold ibrutinib  ##) Possible pneumonia: Noted on chest x-ray and CT.  Patient has minimal symptoms and no elevated white blood cell count.  She also does not have much in the way other than a cough. -Follow-up blood cultures -Continue ceftriaxone and azithromycin  ##) Elevated  troponin: Likely in the setting of demand with atrial flutter/fibrillation. -Trend troponin -Continue enoxaparin per above -Echo pending -Cardiology following appreciate recommendations  ##) CLL: -Hold ibrutinib -Continue acyclovir prophylaxis -Continue PRN ondansetron  Fluids: None Electrolytes: Monitor and supplement Nutrition: Regular diet  Prophylaxis: Full dose enoxaparin  Disposition: Pending rate control on oral medication and evaluation of elevated troponin  Full code     Cristy Folks MD [facility]  If 7PM-7AM, please contact night-coverage [email or web address] Password [facility]  02/12/2018, 11:41 PM

## 2018-02-13 ENCOUNTER — Other Ambulatory Visit: Payer: Self-pay

## 2018-02-13 ENCOUNTER — Encounter (HOSPITAL_COMMUNITY): Payer: Self-pay

## 2018-02-13 ENCOUNTER — Inpatient Hospital Stay (HOSPITAL_COMMUNITY): Payer: Medicare Other

## 2018-02-13 DIAGNOSIS — I499 Cardiac arrhythmia, unspecified: Secondary | ICD-10-CM

## 2018-02-13 DIAGNOSIS — J189 Pneumonia, unspecified organism: Secondary | ICD-10-CM

## 2018-02-13 DIAGNOSIS — I483 Typical atrial flutter: Secondary | ICD-10-CM

## 2018-02-13 DIAGNOSIS — I361 Nonrheumatic tricuspid (valve) insufficiency: Secondary | ICD-10-CM

## 2018-02-13 DIAGNOSIS — C9190 Lymphoid leukemia, unspecified not having achieved remission: Secondary | ICD-10-CM

## 2018-02-13 DIAGNOSIS — I4891 Unspecified atrial fibrillation: Secondary | ICD-10-CM

## 2018-02-13 LAB — MRSA PCR SCREENING: MRSA by PCR: NEGATIVE

## 2018-02-13 LAB — CBC
HCT: 43.7 % (ref 36.0–46.0)
Hemoglobin: 14.8 g/dL (ref 12.0–15.0)
MCH: 31 pg (ref 26.0–34.0)
MCHC: 33.9 g/dL (ref 30.0–36.0)
MCV: 91.6 fL (ref 78.0–100.0)
Platelets: 133 10*3/uL — ABNORMAL LOW (ref 150–400)
RBC: 4.77 MIL/uL (ref 3.87–5.11)
RDW: 14.8 % (ref 11.5–15.5)
WBC: 6 10*3/uL (ref 4.0–10.5)

## 2018-02-13 LAB — BRAIN NATRIURETIC PEPTIDE: B Natriuretic Peptide: 412.5 pg/mL — ABNORMAL HIGH (ref 0.0–100.0)

## 2018-02-13 LAB — BASIC METABOLIC PANEL
BUN: 12 mg/dL (ref 6–20)
CO2: 24 mmol/L (ref 22–32)
Chloride: 102 mmol/L (ref 101–111)
Creatinine, Ser: 0.69 mg/dL (ref 0.44–1.00)
Sodium: 137 mmol/L (ref 135–145)

## 2018-02-13 LAB — TROPONIN I
Troponin I: 0.17 ng/mL (ref <0.03)
Troponin I: 0.15 ng/mL (ref <0.03)
Troponin I: 0.11 ng/mL (ref <0.03)

## 2018-02-13 LAB — BASIC METABOLIC PANEL WITH GFR
Anion gap: 11 (ref 5–15)
Calcium: 8.7 mg/dL — ABNORMAL LOW (ref 8.9–10.3)
GFR calc Af Amer: >60 mL/min (ref >60)
GFR calc non Af Amer: >60 mL/min (ref >60)
Glucose, Bld: 135 mg/dL — ABNORMAL HIGH (ref 65–99)
Potassium: 3.4 mmol/L — ABNORMAL LOW (ref 3.5–5.1)

## 2018-02-13 LAB — ECHOCARDIOGRAM COMPLETE
Height: 60 in
Weight: 2116.42 [oz_av]

## 2018-02-13 MED ORDER — ACETAMINOPHEN 650 MG RE SUPP: 650.0000 mg | Freq: Four times a day (QID) | RECTAL | Status: DC | PRN

## 2018-02-13 MED ORDER — ONDANSETRON 4 MG PO TBDP: 4.0000 mg | ORAL_TABLET | Freq: Three times a day (TID) | ORAL | Status: DC | PRN

## 2018-02-13 MED ORDER — ACYCLOVIR 400 MG PO TABS
400.0000 mg | ORAL_TABLET | Freq: Every day | ORAL | Status: DC
  Administered 2018-02-13 – 2018-02-15 (×3): 400 mg via ORAL
  Filled 2018-02-13 (×3): qty 1

## 2018-02-13 MED ORDER — GUAIFENESIN-CODEINE 100-10 MG/5ML PO SOLN
5.0000 mL | Freq: Three times a day (TID) | ORAL | Status: DC | PRN
  Administered 2018-02-14: 5 mL via ORAL
  Filled 2018-02-13: qty 5

## 2018-02-13 MED ORDER — ONDANSETRON HCL 4 MG PO TABS
4.0000 mg | ORAL_TABLET | Freq: Four times a day (QID) | ORAL | Status: DC | PRN
  Administered 2018-02-13: 4 mg via ORAL
  Filled 2018-02-13: qty 1

## 2018-02-13 MED ORDER — CALCIUM CARBONATE-VITAMIN D 500-200 MG-UNIT PO TABS
1.0000 | ORAL_TABLET | Freq: Two times a day (BID) | ORAL | Status: DC
  Administered 2018-02-13 – 2018-02-15 (×6): 1 via ORAL
  Filled 2018-02-13 (×6): qty 1

## 2018-02-13 MED ORDER — OXYCODONE HCL 5 MG PO TABS: 5.0000 mg | ORAL_TABLET | ORAL | Status: DC | PRN

## 2018-02-13 MED ORDER — PREDNISOLONE ACETATE 1 % OP SUSP
1.0000 [drp] | Freq: Two times a day (BID) | OPHTHALMIC | Status: DC
  Administered 2018-02-13 – 2018-02-15 (×2): 1 [drp] via OPHTHALMIC
  Filled 2018-02-13: qty 5

## 2018-02-13 MED ORDER — DILTIAZEM HCL-DEXTROSE 100-5 MG/100ML-% IV SOLN (PREMIX)
5.0000 mg/h | INTRAVENOUS | Status: DC
  Administered 2018-02-13: 5 mg/h via INTRAVENOUS

## 2018-02-13 MED ORDER — ENOXAPARIN SODIUM 60 MG/0.6ML ~~LOC~~ SOLN
60.0000 mg | Freq: Two times a day (BID) | SUBCUTANEOUS | Status: DC
  Administered 2018-02-13 – 2018-02-14 (×4): 60 mg via SUBCUTANEOUS
  Filled 2018-02-13 (×4): qty 0.6

## 2018-02-13 MED ORDER — METOPROLOL TARTRATE 25 MG PO TABS
25.0000 mg | ORAL_TABLET | Freq: Two times a day (BID) | ORAL | Status: DC
  Administered 2018-02-13 – 2018-02-15 (×6): 25 mg via ORAL
  Filled 2018-02-13 (×6): qty 1

## 2018-02-13 MED ORDER — ONDANSETRON HCL 4 MG/2ML IJ SOLN: 4.0000 mg | Freq: Four times a day (QID) | INTRAMUSCULAR | Status: DC | PRN

## 2018-02-13 MED ORDER — ACETAMINOPHEN 325 MG PO TABS: 650.0000 mg | ORAL_TABLET | Freq: Four times a day (QID) | ORAL | Status: DC | PRN

## 2018-02-13 MED ORDER — ADULT MULTIVITAMIN W/MINERALS CH
1.0000 | ORAL_TABLET | Freq: Every day | ORAL | Status: DC
  Administered 2018-02-13 – 2018-02-15 (×3): 1 via ORAL
  Filled 2018-02-13 (×3): qty 1

## 2018-02-13 MED ORDER — SODIUM CHLORIDE 0.9% FLUSH
3.0000 mL | Freq: Two times a day (BID) | INTRAVENOUS | Status: DC
  Administered 2018-02-13 – 2018-02-15 (×5): 3 mL via INTRAVENOUS

## 2018-02-13 MED ORDER — POLYETHYLENE GLYCOL 3350 17 G PO PACK: 17.0000 g | PACK | Freq: Every day | ORAL | Status: DC | PRN

## 2018-02-13 NOTE — Progress Notes (Signed)
  Echocardiogram 2D Echocardiogram has been performed.  Brenda Terrell 02/13/2018, 12:13 PM

## 2018-02-13 NOTE — Progress Notes (Signed)
@   0254 Pt had a 5.84 sec pause followed by about 2 minutes of bradycardia (HR in 30s) then conversion into NSR. BP currently 100/42 (58) Night floor coverage provider notified.

## 2018-02-13 NOTE — ED Notes (Signed)
ED TO INPATIENT HANDOFF REPORT  Name/Age/Gender Brenda Terrell 77 y.o. female  Code Status Code Status History    This patient does not have a recorded code status. Please follow your organizational policy for patients in this situation.      Home/SNF/Other Home  Chief Complaint chemo card/chest congestion  Level of Care/Admitting Diagnosis ED Disposition    ED Disposition Condition [facility] Area: [place] [[id number]]  Level of Care: Stepdown [14]  Admit to SDU based on following criteria: Cardiac Instability:  Patients experiencing chest pain, unconfirmed MI and stable, arrhythmias and CHF requiring medical management and potentially compromising patient's stability  Diagnosis: Atrial flutter ([place]) [427.32.ICD-9-CM]  Admitting Physician: Cristy Folks [[id number]]  Attending Physician: Cristy Folks [phone]  Estimated length of stay: 3 - 4 days  Certification:: I certify this patient will need inpatient services for at least 2 midnights  PT Class (Do Not Modify): Inpatient [101]  PT Acc Code (Do Not Modify): Private [1]       Medical History Past Medical History:  Diagnosis Date  . Arthritis    HIP  . Asthma   . Atrial flutter ([place]) 02/12/2018  . Blood transfusion    POST D&C  . 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 Complication of anesthesia   . GERD (gastroesophageal reflux disease)   . Goiter   . History of migraines   . Mitral valve prolapse   . PONV (postoperative nausea and vomiting)     Allergies Allergies  Allergen Reactions  . Gluten Meal Other (See Comments)    Gi upset/bloating  . Influenza Virus Vacc Split Pf     DOES NOT TAKE DUE TO ALLERGIC REACTIONS IN THE PAST- pt states she got very sick from this & her oncologist recommended she no longer get it  . Nutrasweet Aspartame [Aspartame]   . Salicylates Cross Reactors   . Sulfa Antibiotics  Swelling    Swelling and rash    IV Location/Drains/Wounds Patient Lines/Drains/Airways Status   Active Line/Drains/Airways    Name:   Placement date:   Placement time:   Site:   Days:   Peripheral IV 02/12/18 Right Antecubital   02/12/18    2128    Antecubital   1   Peripheral IV 02/12/18 Left;Upper Arm   02/12/18    2140    Arm   1   Incision (Closed) 12/31/16 Neck Left   12/31/16    1005     409          Labs/Imaging Results for orders placed or performed during the hospital encounter of 02/12/18 (from the past 48 hour(s))  Influenza panel by PCR (type A & B)     Status: None   Collection Time: 02/12/18  4:59 PM  Result Value Ref Range   Influenza A By PCR NEGATIVE NEGATIVE   Influenza B By PCR NEGATIVE NEGATIVE    Comment: (NOTE) The Xpert Xpress Flu assay is intended as an aid in the diagnosis of  influenza and should not be used as a sole basis for treatment.  This  assay is FDA approved for nasopharyngeal swab specimens only. Nasal  washings and aspirates are unacceptable for Xpert Xpress Flu testing. Performed at [facility], [place] [address] [place], [place] [address]   Basic metabolic panel     Status: Abnormal   Collection Time: 02/12/18  4:59 PM  Result Value Ref Range   Sodium 137 135 - 145 mmol/L   Potassium 3.8 3.5 - 5.1 mmol/L   Chloride 102 101 - 111 mmol/L   CO2 27 22 - 32 mmol/L   Glucose, Bld 108 (H) 65 - 99 mg/dL   BUN 12 6 - 20 mg/dL   Creatinine, Ser 0.67 0.44 - 1.00 mg/dL   Calcium 9.0 8.9 - 10.3 mg/dL   GFR calc non Af Amer >60 >60 mL/min   GFR calc Af Amer >60 >60 mL/min    Comment: (NOTE) The eGFR has been calculated using the CKD EPI equation. This calculation has not been validated in all clinical situations. eGFR's persistently <60 mL/min signify possible Chronic Kidney Disease.    Anion gap 8 5 - 15    Comment: Performed at [facility], [place] [address], [place], [place] [address]  CBC with  Differential     Status: Abnormal   Collection Time: 02/12/18  4:59 PM  Result Value Ref Range   WBC 5.9 4.0 - 10.5 K/uL   RBC 4.66 3.87 - 5.11 MIL/uL   Hemoglobin 14.4 12.0 - 15.0 g/dL   HCT 42.4 36.0 - 46.0 %   MCV 91.0 78.0 - 100.0 fL   MCH 30.9 26.0 - 34.0 pg   MCHC 34.0 30.0 - 36.0 g/dL   RDW 14.8 11.5 - 15.5 %   Platelets 142 (L) 150 - 400 K/uL   Neutrophils Relative % 53 %   Neutro Abs 3.1 1.7 - 7.7 K/uL   Lymphocytes Relative 25 %   Lymphs Abs 1.5 0.7 - 4.0 K/uL   Monocytes Relative 22 %   Monocytes Absolute 1.3 (H) 0.1 - 1.0 K/uL   Eosinophils Relative 0 %   Eosinophils Absolute 0.0 0.0 - 0.7 K/uL   Basophils Relative 0 %   Basophils Absolute 0.0 0.0 - 0.1 K/uL    Comment: Performed at [facility], [place] [address] [place], [place] [address]  Troponin I     Status: Abnormal   Collection Time: 02/12/18  8:02 PM  Result Value Ref Range   Troponin I 0.20 (HH) <0.03 ng/mL    Comment: CRITICAL RESULT CALLED TO, READ BACK BY AND VERIFIED WITH: J.TALKINGTON,PA 02/12/18 _0  BY V.WILKINS Performed at [facility], [place] [address] [place], [place] [address]   D-dimer, quantitative (not at [facility])     Status: Abnormal   Collection Time: 02/12/18  8:02 PM  Result Value Ref Range   D-Dimer, Quant 0.53 (H) 0.00 - 0.50 ug/mL-FEU    Comment: (NOTE) At the manufacturer cut-off of 0.50 ug/mL FEU, this assay has been documented to exclude PE with a sensitivity and negative predictive value of 97 to 99%.  At this time, this assay has not been approved by the FDA to exclude DVT/VTE. Results should be correlated with clinical presentation. Performed at [facility], [place] [address], [place], [place] [address]   I-Stat Troponin, ED (not at [facility])     Status: Abnormal   Collection Time: 02/12/18  8:12 PM  Result Value Ref Range   Troponin i, poc 0.27 (HH) 0.00 - 0.08 ng/mL   Comment NOTIFIED PHYSICIAN    Comment 3             Comment: Due to the release kinetics of cTnI, a negative result within the first hours of the onset of symptoms does not rule out myocardial infarction with certainty. If myocardial  infarction is still suspected, repeat the test at appropriate intervals.    Dg Chest 2 View  Result Date: 02/12/2018 CLINICAL DATA:  Chest tightness with deep inspiration and difficulty breathing. EXAM: CHEST  2 VIEW COMPARISON:  09/14/2017 FINDINGS: Aortic atherosclerosis. Moderate cardiac enlargement. Small bilateral pleural effusions identified. Left midlung and right base scar versus atelectasis. IMPRESSION: 1. Cardiac enlargement and small pleural effusion 2.  Aortic Atherosclerosis (ICD10-I70.0). 3. Left midlung and right base atelectasis versus scar. Electronically Signed   By: Taylor  Stroud M.D.   On: 02/12/2018 16:46   Ct Angio Chest Pe W And/or Wo Contrast  Result Date: 02/12/2018 CLINICAL DATA:  Cough congestion EXAM: CT ANGIOGRAPHY CHEST WITH CONTRAST TECHNIQUE: Multidetector CT imaging of the chest was performed using the standard protocol during bolus administration of intravenous contrast. Multiplanar CT image reconstructions and MIPs were obtained to evaluate the vascular anatomy. CONTRAST:  100 mL Isovue 370 intravenous COMPARISON:  Radiograph 02/12/2018, PET-CT 09/14/2017, CT thorax 02/19/2017 FINDINGS: Cardiovascular: Satisfactory opacification of the pulmonary arteries to the segmental level. No evidence of pulmonary embolism. Limited evaluation for pulmonary emboli in the bilateral lower lobes due to respiratory motion artifact. Nonaneurysmal aorta. Mild atherosclerotic calcification. Mild cardiomegaly. Development of small moderate pericardial effusion, measuring up to 12 mm in thickness on the left side. Reflux of contrast into the hepatic veins. Mediastinum/Nodes: Midline trachea. No thyroid mass. Scattered nodes in the mediastinum but not increased since comparison PET-CT, right low paratracheal  lymph node measures 11 mm compared to 13 mm previously. Esophagus within normal limits. Lungs/Pleura: Small bilateral pleural effusions. Patchy consolidation at the left lower lobe with streaky densities in the lingula. Negative for a pneumothorax. Upper Abdomen: No acute abnormality. 2.4 cm fluid collection in the subcutaneous soft tissues of the anterior abdominal wall noted on prior exams. Musculoskeletal: Degenerative changes. No acute or suspicious abnormality Review of the MIP images confirms the above findings. IMPRESSION: 1. Limited evaluation for emboli in the lower lobes due to respiratory motion artifact. No acute embolus is seen. Negative for aortic dissection. 2. Cardiomegaly. Development of small moderate pericardial effusion, measuring up to 12 mm in maximum thickness. Trace bilateral pleural effusions. Partial consolidation in the left lower lobe and patchy density in the lingula which may reflect atelectasis or mild pneumonia. 3. No new or enlarging adenopathy since most recent comparison PET-CT September 2018. Aortic Atherosclerosis (ICD10-I70.0). Electronically Signed   By: Kim  Fujinaga M.D.   On: 02/12/2018 22:45    Pending Labs Unresulted Labs (From admission, onward)   Start     Ordered   Signed and Held  Basic metabolic panel  Tomorrow morning,   R     Signed and Held   Signed and Held  CBC  Tomorrow morning,   R     Signed and Held   Signed and Held  Brain natriuretic peptide  Tomorrow morning,   R     Signed and Held   Signed and Held  Troponin I (q 6hr x 3)  Now then every 6 hours,   R     Signed and Held      Vitals/Pain Today's Vitals   02/12/18 2245 02/12/18 2300 02/12/18 2355 02/13/18 0009  BP:  (!) 119/46 135/63 119/83  Pulse: 67 71 90 90  Resp: (!) 24 15 18 (!) 24  Temp:      TempSrc:      SpO2: 97% 96% 95% 96%  Weight:      Height:        PainSc:    0-No pain    Isolation Precautions No active isolations  Medications Medications  azithromycin  (ZITHROMAX) 500 mg in sodium chloride 0.9 % 250 mL IVPB (500 mg Intravenous New Bag/Given 02/13/18 0009)  albuterol (PROVENTIL) (2.5 MG/3ML) 0.083% nebulizer solution 5 mg (5 mg Nebulization Given 02/12/18 2018)  iopamidol (ISOVUE-370) 76 % injection (100 mLs Intravenous Contrast Given 02/12/18 2204)  diltiazem (CARDIZEM) 100 mg in dextrose 5% 1108m (1 mg/mL) infusion (5 mg/hr Intravenous New Bag/Given 02/12/18 2132)  cefTRIAXone (ROCEPHIN) 1 g in sodium chloride 0.9 % 100 mL IVPB (1 g Intravenous New Bag/Given 02/12/18 2343)    Mobility walks with device

## 2018-02-13 NOTE — Progress Notes (Signed)
[facility] PROGRESS NOTE    Progress Note  Brenda Terrell  HYI:[id number] DOB: 11/02/1941 DOA: 02/12/2018 PCP: Kelton Pillar, MD     Brief Narrative:   Brenda Terrell is an 77 y.o. 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 fib /flutter,2D echo is pending, previous 2D echo showed grade 1 diastolic heart failure, She was started on IV diltiazem drip and continue on home dose metoprolol, was started on anticoagulation, now in sinus rhythm. Try to keep electrolytes magnesium greater than 2 potassium greater than 4.  Hold ibrutinib Cardiology consulted awaiting recommendations.  She does have some lower extremity edema and positive hepatojugular reflex,we will restrict her fluids. Patient cardiac markers likely due to demand ischemia now trending down she denies any chest pain or shortness of breath is resolved.  Community-acquired pneumonia: Noted on CT scan, patient has no cough no white count or fever, started empirically on Rocephin and azithro culture data is pending.  CLL -Hold ibrutinib -Continue acyclovir prophylaxis    DVT prophylaxis: lovenox Family Communication:husband Disposition Plan/Barrier to D/C: unable to determine Code Status:     Code Status Orders  (From admission, onward)        Start     Ordered   02/13/18 0102  Full code  Continuous     02/13/18 0101    Code Status History    Date Active Date Inactive Code Status Order ID Comments User Context   This patient has a current code status but no historical code status.        IV Access:    Peripheral IV   Procedures  and diagnostic studies:   Dg Chest 2 View  Result Date: 02/12/2018 CLINICAL DATA:  Chest tightness with deep inspiration and difficulty breathing. EXAM: CHEST  2 VIEW COMPARISON:  09/14/2017 FINDINGS: Aortic atherosclerosis. Moderate cardiac enlargement. Small bilateral pleural effusions identified. Left midlung and right base scar versus atelectasis. IMPRESSION: 1. Cardiac enlargement and small pleural effusion 2.  Aortic Atherosclerosis (ICD10-I70.0). 3. Left midlung and right base atelectasis versus scar. Electronically Signed   By: Kerby Moors M.D.   On: 02/12/2018 16:46   Ct Angio Chest Pe W And/or Wo Contrast  Result Date: 02/12/2018 CLINICAL DATA:  Cough congestion EXAM: CT ANGIOGRAPHY CHEST WITH CONTRAST TECHNIQUE: Multidetector CT imaging of the chest was performed using the standard protocol during bolus administration of intravenous contrast. Multiplanar CT image reconstructions and MIPs were obtained to evaluate the vascular anatomy. CONTRAST:  100 mL Isovue 370 intravenous COMPARISON:  Radiograph 02/12/2018, PET-CT 09/14/2017, CT thorax 02/19/2017 FINDINGS: Cardiovascular: Satisfactory opacification of the pulmonary arteries to the segmental level. No evidence of pulmonary embolism. Limited evaluation for pulmonary emboli in the bilateral lower lobes due to respiratory motion artifact. Nonaneurysmal aorta. Mild atherosclerotic calcification. Mild cardiomegaly. Development of small moderate pericardial effusion, measuring up to 12 mm in thickness on the left side. Reflux of contrast into the hepatic veins. Mediastinum/Nodes: Midline trachea. No thyroid mass. Scattered nodes in the mediastinum but not increased since comparison PET-CT, right low paratracheal lymph node measures 11 mm compared to 13 mm previously. Esophagus within normal limits. Lungs/Pleura: Small bilateral pleural effusions. Patchy consolidation at the  left lower lobe with streaky densities in the lingula. Negative for a  pneumothorax. Upper Abdomen: No acute abnormality. 2.4 cm fluid collection in the subcutaneous soft tissues of the anterior abdominal wall noted on prior exams. Musculoskeletal: Degenerative changes. No acute or suspicious abnormality Review of the MIP images confirms the above findings. IMPRESSION: 1. Limited evaluation for emboli in the lower lobes due to respiratory motion artifact. No acute embolus is seen. Negative for aortic dissection. 2. Cardiomegaly. Development of small moderate pericardial effusion, measuring up to 12 mm in maximum thickness. Trace bilateral pleural effusions. Partial consolidation in the left lower lobe and patchy density in the lingula which may reflect atelectasis or mild pneumonia. 3. No new or enlarging adenopathy since most recent comparison PET-CT September 2018. Aortic Atherosclerosis (ICD10-I70.0). Electronically Signed   By: Donavan Foil M.D.   On: 02/12/2018 22:45     Medical Consultants:    None.  Anti-Infectives:   IV Rocephin and azithro  Subjective:    Forbes Cellar Gad   Objective:    Vitals:   02/13/18 0000 02/13/18 0009 02/13/18 0100 02/13/18 0400  BP: (!) 119/38 119/83  (!) 89/36  Pulse: (!) 42 90  73  Resp: (!) 30 (!) 24  (!) 21  Temp:    98 F (36.7 C)  TempSrc:    Oral  SpO2: 96% 96%  91%  Weight:   60 kg (132 lb 4.4 oz)   Height:   5' (1.524 m)     Intake/Output Summary (Last 24 hours) at 02/13/2018 0726 Last data filed at 02/13/2018 0400 Gross per 24 hour  Intake 622.75 ml  Output -  Net 622.75 ml   Filed Weights   02/12/18 1838 02/13/18 0100  Weight: 60.8 kg (134 lb) 60 kg (132 lb 4.4 oz)    Exam: General exam: In no acute distress. Respiratory system: Good air movement and clear to auscultation. Cardiovascular system: S1 & S2 heard, RRR.  Positive hepatojugular reflux Gastrointestinal system: Abdomen is nondistended, soft and nontender.  Central nervous system: Alert and oriented. No focal neurological  deficits. Extremities: Trace edema Skin: No rashes, lesions or ulcers   Data Reviewed:    Labs: Basic Metabolic Panel: Recent Labs  Lab 02/12/18 1659 02/13/18 0129  NA 137 137  K 3.8 3.4*  CL 102 102  CO2 27 24  GLUCOSE 108* 135*  BUN 12 12  CREATININE 0.67 0.69  CALCIUM 9.0 8.7*   GFR Estimated Creatinine Clearance: 47.7 mL/min (by C-G formula based on SCr of 0.69 mg/dL). Liver Function Tests: No results for input(s): AST, ALT, ALKPHOS, BILITOT, PROT, ALBUMIN in the last 168 hours. No results for input(s): LIPASE, AMYLASE in the last 168 hours. No results for input(s): AMMONIA in the last 168 hours. Coagulation profile No results for input(s): INR, PROTIME in the last 168 hours.  CBC: Recent Labs  Lab 02/12/18 1659 02/13/18 0129  WBC 5.9 6.0  NEUTROABS 3.1  --   HGB 14.4 14.8  HCT 42.4 43.7  MCV 91.0 91.6  PLT 142* 133*   Cardiac Enzymes: Recent Labs  Lab 02/12/18 2002 02/13/18 0129  TROPONINI 0.20* 0.17*   BNP (last 3 results) No results for input(s): PROBNP in the last 8760 hours. CBG: No results for input(s): GLUCAP in the last 168 hours. D-Dimer: Recent Labs    02/12/18 2002  DDIMER 0.53*   Hgb A1c: No results for input(s): HGBA1C in the last 72 hours. Lipid Profile: No results for input(s): CHOL, HDL,  LDLCALC, TRIG, CHOLHDL, LDLDIRECT in the last 72 hours. Thyroid function studies: No results for input(s): TSH, T4TOTAL, T3FREE, THYROIDAB in the last 72 hours.  Invalid input(s): FREET3 Anemia work up: No results for input(s): VITAMINB12, FOLATE, FERRITIN, TIBC, IRON, RETICCTPCT in the last 72 hours. Sepsis Labs: Recent Labs  Lab 02/12/18 1659 02/13/18 0129  WBC 5.9 6.0   Microbiology Recent Results (from the past 240 hour(s))  MRSA PCR Screening     Status: None   Collection Time: 02/13/18  1:10 AM  Result Value Ref Range Status   MRSA by PCR NEGATIVE NEGATIVE Final    Comment:        The GeneXpert MRSA Assay (FDA approved for  NASAL specimens only), is one component of a comprehensive MRSA colonization surveillance program. It is not intended to diagnose MRSA infection nor to guide or monitor treatment for MRSA infections. Performed at [facility], [place] [address], [place], [place] [address]      Medications:   . acyclovir  400 mg Oral Daily  . calcium-vitamin D  1 tablet Oral BID  . enoxaparin (LOVENOX) injection  60 mg Subcutaneous BID  . metoprolol tartrate  25 mg Oral BID  . multivitamin with minerals  1 tablet Oral Daily  . prednisoLONE acetate  1 drop Right Eye BID  . sodium chloride flush  3 mL Intravenous Q12H   Continuous Infusions: . diltiazem (CARDIZEM) infusion Stopped (02/13/18 0259)      LOS: 1 day   Charlynne Cousins  [facility] Pager [phone]  *Please refer to [email or web address], password [email or web address] to get updated schedule on who will round on this patient, as hospitalists switch teams weekly. If 7PM-7AM, please contact night-coverage at [email or web address], password [email or web address] for any overnight needs.  02/13/2018, 7:26 AM

## 2018-02-14 ENCOUNTER — Encounter (HOSPITAL_COMMUNITY): Payer: Self-pay | Admitting: Physician Assistant

## 2018-02-14 DIAGNOSIS — I480 Paroxysmal atrial fibrillation: Principal | ICD-10-CM

## 2018-02-14 MED ORDER — APIXABAN 5 MG PO TABS
5.0000 mg | ORAL_TABLET | Freq: Two times a day (BID) | ORAL | Status: DC
  Administered 2018-02-14 – 2018-02-15 (×2): 5 mg via ORAL
  Filled 2018-02-14 (×2): qty 1

## 2018-02-14 MED ORDER — POTASSIUM CHLORIDE CRYS ER 20 MEQ PO TBCR
40.0000 meq | EXTENDED_RELEASE_TABLET | Freq: Two times a day (BID) | ORAL | Status: AC
  Administered 2018-02-14 (×2): 40 meq via ORAL
  Filled 2018-02-14 (×2): qty 2

## 2018-02-14 NOTE — Progress Notes (Signed)
Cardiology Consultation:   Patient ID: Brenda Terrell; [id number]; January 25, 1941   Admit date: 02/12/2018 Date of Consult: 02/14/2018  Primary Care Provider: Kelton Pillar, MD Primary Cardiologist: New, Brenda Brenda Terrell Primary Electrophysiologist:  n/a   Patient Profile:   Brenda Terrell is a 77 y.o. 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  History of Present Illness:   Brenda Terrell was admitted 02/18 with SOB and chest pain.  Brenda Terrell was started on ibrutinib 05/2017. She had no prior history of arrhythmia but has had palpitations all her life. Single skips, no associated sx. However, after starting the ibrutinib, the palpitations got much worse. The med was held for several weeks 08/2017 and then restarted at a lower dose. ECGs were done in July, all SR. The palpitations continued, echo 11/2017 was ok.  Pt was put on metoprolol before Christmas, after a month or 2, the palpitations had improved. They did not make her light-headed or dizzy. She has DOE at times, not related to palps. The palps bothered her the most when lying in bed, she could feel them more then.   She thought she had a cold, was aching and had a sore throat. She developed N&V, when she did not improve after 48 hours, her breathing was bad and her chest felt tight, 7/10. She came to the ER, sx improved. She had been given a neb, IV Cardizem, Zofran, and was on metoprolol at increased dose. No palpitations once she went back into SR.  She is still coughing and feels a slight burning in her chest.  All of these sx are new.     Past Medical History:  Diagnosis Date  . Arthritis    HIP  . Asthma   . Atrial fibrillation ([place]) 02/12/2018  . Benign heart murmur    Previously thought to have MVP, NOT seen on echo 02/13/2018  . Blood transfusion    POST D&C    . 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 Complication of anesthesia   . GERD (gastroesophageal reflux disease)   . Goiter   . History of migraines   . PONV (postoperative nausea and vomiting)     Past Surgical History:  Procedure Laterality Date  . FOOT SURGERY     bilateral  . LYMPH NODE BIOPSY Left 12/31/2016   Procedure: LYMPH NODE BIOPSY LEFT SUPRACLIAVICULAR;  Surgeon: Brenda Riley, MD;  Location: [facility];  Service: General;  Laterality: Left;  . TONSILLECTOMY       Prior to Admission medications   Medication Sig Start Date End Date Taking? Authorizing Provider  acyclovir (ZOVIRAX) 400 MG tablet Take 1 tablet (400 mg total) by mouth daily. 10/06/17  Yes Brenda Terrell, Brenda Spell, MD  Calcium Carbonate-Vitamin D (CALCIUM 600+D) 600-200 MG-UNIT TABS Take 1 tablet by mouth 2 (two) times daily.   Yes [provider]  guaiFENesin-codeine 100-10 MG/5ML syrup Take 5 mLs by mouth 3 (three) times daily as needed for cough. 09/12/17  Yes Brenda Terrell, Ni, MD  Ibrutinib 280 MG TABS Take 1 tablet by mouth daily. Take with a full glass of water, maintain adequate hydration. 10/06/17  Yes Brenda Terrell, Ni, MD  metoprolol tartrate (LOPRESSOR) 25 MG tablet Take 1 tablet (25 mg total) 2 (two) times daily by mouth. 10/31/17  Yes Brenda Lark, MD  Multiple Vitamin (MULTIVITAMIN WITH MINERALS) TABS tablet Take 1 tablet by mouth daily.   Yes [provider]  ondansetron (ZOFRAN ODT) 4 MG disintegrating tablet 4mg  ODT q6 hours prn nausea/vomit 02/19/17  Yes Brenda Freeze, MD  prednisoLONE acetate (PRED FORTE) 1 % ophthalmic suspension Place 1 drop into the right eye 2 (two) times daily. 03/31/17  Yes [provider]    Inpatient Medications: Scheduled Meds: . acyclovir  400 mg Oral Daily  . calcium-vitamin D  1 tablet Oral BID  . enoxaparin (LOVENOX) injection  60 mg Subcutaneous BID  . metoprolol tartrate  25 mg Oral BID  .  multivitamin with minerals  1 tablet Oral Daily  . prednisoLONE acetate  1 drop Right Eye BID  . sodium chloride flush  3 mL Intravenous Q12H   Continuous Infusions: . diltiazem (CARDIZEM) infusion Stopped (02/13/18 0259)   PRN Meds: acetaminophen **OR** acetaminophen, guaiFENesin-codeine, ondansetron **OR** ondansetron (ZOFRAN) IV, ondansetron, oxyCODONE, polyethylene glycol  Allergies:    Allergies  Allergen Reactions  . Gluten Meal Other (See Comments)    Gi upset/bloating  . Influenza Virus Vacc Split Pf     DOES NOT TAKE DUE TO ALLERGIC REACTIONS IN THE PAST- pt states she got very sick from this & her oncologist recommended she no longer get it  . Nutrasweet Aspartame [Aspartame]   . Salicylates Cross Reactors   . Sulfa Antibiotics Swelling    Swelling and rash    Social History:   Social History   Socioeconomic History  . Marital status: Married    Spouse name: Brenda Terrell  . Number of children: 2  . Years of education: Not on file  . Highest education level: Not on file  Social Needs  . Financial resource strain: Not on file  . Food insecurity - worry: Not on file  . Food insecurity - inability: Not on file  . Transportation needs - medical: Not on file  . Transportation needs - non-medical: Not on file  Occupational History  . Occupation: retired Regulatory affairs officer  Tobacco Use  . Smoking status: Never Smoker  . Smokeless tobacco: Never Used  Substance and Sexual Activity  . Alcohol use: No  . Drug use: No  . Sexual activity: Not on file  Other Topics Concern  . Not on file  Social History Narrative  . Not on file    Family History:   Family History  Problem Relation Age of Onset  . Cancer Mother 3       breast ca  . Cancer Brother        bone cancer   Family Status:  Family Status  Relation Name Status  . Mother  Deceased  . Brother  Deceased    ROS:  Please see the history of present illness.  All other ROS reviewed and negative.     Physical  Exam/Data:   Vitals:   02/13/18 1613 02/13/18 2028 02/14/18 0551 02/14/18 0843  BP: 126/62 (!) 122/51 (!) 128/57 (!) 118/46  Pulse: 85 88 85 96  Resp: 18 16 18 16   Temp: 98.2 F (36.8 C) 98.5 F (36.9 C) 98.2 F (36.8 C)   TempSrc: Oral Oral Oral   SpO2: 95% 99% 100% 97%  Weight:      Height:       No intake or output data in the 24 hours ending 02/14/18 0959 Filed Weights   02/12/18 1838 02/13/18 0100  Weight: 134 lb (60.8 kg)  132 lb 4.4 oz (60 kg)   Body mass index is 25.83 kg/m.  General:  Well nourished, well developed, in no acute distress HEENT: normal Lymph: no adenopathy Neck: adenopathy currently improved  Endocrine:  No thryomegaly Vascular: No carotid bruits; 4/4 extremity pulses 2+, without bruits  Cardiac:  normal S1, S2; RRR; SEM  Lungs:  clear to auscultation bilaterally, no wheezing, rhonchi or rales  Abd: soft, nontender, no hepatomegaly  Ext: no edema Musculoskeletal:  No deformities, BUE and BLE strength normal and equal Skin: Small petechial like lesions in finger tips  Neuro:  CNs 2-12 intact, no focal abnormalities noted Psych:  Normal affect   EKG:  afib nonspecific ST changes  Telemetry:  Telemetry was personally reviewed and demonstrates:  NSR this am   Relevant CV Studies:  ECHO: 02/13/2018 - Left ventricle: The cavity size was normal. There was mild   concentric hypertrophy. Systolic function was normal. The   estimated ejection fraction was in the range of 50% to 55%. Wall   motion was normal; there were no regional wall motion   abnormalities. Doppler parameters are consistent with abnormal   left ventricular relaxation (grade 1 diastolic dysfunction). - Aortic valve: There was mild regurgitation. - Mitral valve: There was mild regurgitation. - Right ventricle: The cavity size was normal. Wall thickness was   normal. Systolic function was normal. - Right atrium: The atrium was normal in size. - Tricuspid valve: There was mild  regurgitation. - Pulmonary arteries: Systolic pressure was mildly increased. PA   peak pressure: 38 mm Hg (S). - Inferior vena cava: The vessel was normal in size. The   respirophasic diameter changes were in the normal range (>= 50%),   consistent with normal central venous pressure. - Pericardium, extracardiac: There was no pericardial effusion. Impressions: - When compared to the rior study from 12/16/2017 there is now   moderate circumferential pericardial effusion with maximum   diameter 11 mm, previously trivial. There are no signs of   tamponade.  Laboratory Data:  Chemistry Recent Labs  Lab 02/12/18 1659 02/13/18 0129  NA 137 137  K 3.8 3.4*  CL 102 102  CO2 27 24  GLUCOSE 108* 135*  BUN 12 12  CREATININE 0.67 0.69  CALCIUM 9.0 8.7*  GFRNONAA >60 >60  GFRAA >60 >60  ANIONGAP 8 11    Lab Results  Component Value Date   ALT 26 12/12/2017   AST 39 (H) 12/12/2017   ALKPHOS 78 12/12/2017   BILITOT 0.50 12/12/2017   Hematology Recent Labs  Lab 02/12/18 1659 02/13/18 0129  WBC 5.9 6.0  RBC 4.66 4.77  HGB 14.4 14.8  HCT 42.4 43.7  MCV 91.0 91.6  MCH 30.9 31.0  MCHC 34.0 33.9  RDW 14.8 14.8  PLT 142* 133*   Cardiac Enzymes Recent Labs  Lab 02/12/18 2002 02/13/18 0129 02/13/18 0712 02/13/18 1247  TROPONINI 0.20* 0.17* 0.15* 0.11*    Recent Labs  Lab 02/12/18 2012  TROPIPOC 0.27*    BNP Recent Labs  Lab 02/13/18 0129  BNP 412.5*    DDimer  Recent Labs  Lab 02/12/18 2002  DDIMER 0.53*   TSH: No results found for: TSH Lipids:No results found for: CHOL, HDL, LDLCALC, LDLDIRECT, TRIG, CHOLHDL HgbA1c:No results found for: HGBA1C Magnesium: No results found for: MG   Radiology/Studies:  Dg Chest 2 View  Result Date: 02/12/2018 CLINICAL DATA:  Chest tightness with deep inspiration and difficulty breathing. EXAM: CHEST  2 VIEW COMPARISON:  09/14/2017  FINDINGS: Aortic atherosclerosis. Moderate cardiac enlargement. Small bilateral pleural  effusions identified. Left midlung and right base scar versus atelectasis. IMPRESSION: 1. Cardiac enlargement and small pleural effusion 2.  Aortic Atherosclerosis (ICD10-I70.0). 3. Left midlung and right base atelectasis versus scar. Electronically Signed   By: Kerby Moors M.D.   On: 02/12/2018 16:46   Ct Angio Chest Pe W And/or Wo Contrast  Result Date: 02/12/2018 CLINICAL DATA:  Cough congestion EXAM: CT ANGIOGRAPHY CHEST WITH CONTRAST TECHNIQUE: Multidetector CT imaging of the chest was performed using the standard protocol during bolus administration of intravenous contrast. Multiplanar CT image reconstructions and MIPs were obtained to evaluate the vascular anatomy. CONTRAST:  100 mL Isovue 370 intravenous COMPARISON:  Radiograph 02/12/2018, PET-CT 09/14/2017, CT thorax 02/19/2017 FINDINGS: Cardiovascular: Satisfactory opacification of the pulmonary arteries to the segmental level. No evidence of pulmonary embolism. Limited evaluation for pulmonary emboli in the bilateral lower lobes due to respiratory motion artifact. Nonaneurysmal aorta. Mild atherosclerotic calcification. Mild cardiomegaly. Development of small moderate pericardial effusion, measuring up to 12 mm in thickness on the left side. Reflux of contrast into the hepatic veins. Mediastinum/Nodes: Midline trachea. No thyroid mass. Scattered nodes in the mediastinum but not increased since comparison PET-CT, right low paratracheal lymph node measures 11 mm compared to 13 mm previously. Esophagus within normal limits. Lungs/Pleura: Small bilateral pleural effusions. Patchy consolidation at the left lower lobe with streaky densities in the lingula. Negative for a pneumothorax. Upper Abdomen: No acute abnormality. 2.4 cm fluid collection in the subcutaneous soft tissues of the anterior abdominal wall noted on prior exams. Musculoskeletal: Degenerative changes. No acute or suspicious abnormality Review of the MIP images confirms the above findings.  IMPRESSION: 1. Limited evaluation for emboli in the lower lobes due to respiratory motion artifact. No acute embolus is seen. Negative for aortic dissection. 2. Cardiomegaly. Development of small moderate pericardial effusion, measuring up to 12 mm in maximum thickness. Trace bilateral pleural effusions. Partial consolidation in the left lower lobe and patchy density in the lingula which may reflect atelectasis or mild pneumonia. 3. No new or enlarging adenopathy since most recent comparison PET-CT September 2018. Aortic Atherosclerosis (ICD10-I70.0). Electronically Signed   By: Donavan Foil M.D.   On: 02/12/2018 22:45    Assessment and Plan:    Principal Problem:   Elevated troponin Active Problems:   CLL (chronic lymphocytic leukemia) (HCC)   Atrial flutter ([place])  1. 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 Incidence can be as high as 16% She describes  Likely PAF since June. Concern that lesions in fingers were embolic. CHA2Vasc 3 And HASBLED on ly 1 suggests the anticoagulation indicated. Will start Eliquis 5 bid Would continue beta blocker as cardizem, verapamil and amiodarone can increase levels Of ibrutinib [place] give one/ two doses of prednisone and discuss with Brenda Alvy Bimler continuing medicine at lower dose If she has recurrent PAF will need to start Tikosyn or Stop the medicine all together although it does tend to help her adenopathy. Risk of NOAC should not be that high so long as PLT count stays above 100.  She is only on day 6 of her current cycle so should discuss with oncology what to do with prednisone and dosing   For questions or updates, please contact [place] Please consult [email or web address] for contact info under Cardiology/STEMI.   Signed, Jenkins Rouge, MD  02/14/2018 9:59 AM

## 2018-02-14 NOTE — Progress Notes (Addendum)
[place] for apixaban Indication: atrial fibrillation  Allergies  Allergen Reactions  . Gluten Meal Other (See Comments)    Gi upset/bloating  . Influenza Virus Vacc Split Pf     DOES NOT TAKE DUE TO ALLERGIC REACTIONS IN THE PAST- pt states she got very sick from this & her oncologist recommended she no longer get it  . Nutrasweet Aspartame [Aspartame]   . Salicylates Cross Reactors   . Sulfa Antibiotics Swelling    Swelling and rash    Patient Measurements: Height: 5' (152.4 cm) Weight: 132 lb 4.4 oz (60 kg) IBW/kg (Calculated) : 45.5  Vital Signs: Temp: 98.2 F (36.8 C) (02/19 0551) Temp Source: Oral (02/19 0551) BP: 118/46 (02/19 0843) Pulse Rate: 96 (02/19 0843)  Labs: Recent Labs    02/12/18 1659  02/13/18 0129 02/13/18 0712 02/13/18 1247  HGB 14.4  --  14.8  --   --   HCT 42.4  --  43.7  --   --   PLT 142*  --  133*  --   --   CREATININE 0.67  --  0.69  --   --   TROPONINI  --    < > 0.17* 0.15* 0.11*   < > = values in this interval not displayed.    Estimated Creatinine Clearance: 47.7 mL/min (by C-G formula based on SCr of 0.69 mg/dL).   Medical History: Past Medical History:  Diagnosis Date  . Arthritis    HIP  . Asthma   . Atrial fibrillation ([place]) 02/12/2018  . Benign heart murmur    Previously thought to have MVP, NOT seen on echo 02/13/2018  . Blood transfusion    POST D&C  . 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 Complication of anesthesia   . GERD (gastroesophageal reflux disease)   . Goiter   . History of migraines   . PONV (postoperative nausea and vomiting)      Assessment: 35 yoF admitted with PaF related to ibrutinib, which was already dose reduced for palpitation. Ibrutinib held.  Started on treatment Lovenox upon admission 2/18 and now to transition to apixaban 2/19.  Platelets low at 133K - appear stable, near baseline.  Hgb  WNL. SCr 0.69, CrCl~47 ml/min - also stable.  Goal of Therapy:  Prevention of stroke and systemic embolism   Plan:  Apixaban 5 mg BID.   F/u oncology recommendations for ibrutinib (discontinue indefinitely? Substitute prednisone?). Please note that patients on ibrutinib have increased risk of bleeding events, and if resumed, it may enhance the adverse/toxic effect of apixaban.   Hershal Coria 02/14/2018,10:13 AM

## 2018-02-14 NOTE — Progress Notes (Signed)
[facility] PROGRESS NOTE    Progress Note  Brenda Terrell  TLX:[id number] DOB: 02-Apr-1941 DOA: 02/12/2018 PCP: Kelton Pillar, MD     Brief Narrative:   Brenda Terrell is an 77 y.o. 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 fib /flutter. 2D echo done that showed any preserved EF with grade 1 diastolic heart failure. In the setting Ibrutinib, hold  Ibrutinib, as per cardiology recommendation.  Started on Eliquis CHA2Vasc 3. Cardiology also recommended to continue beta-blockers.   Consult cardiology to see if there is a need or role for steroids. Patient cardiac markers likely due to demand ischemia now trending down she denies any chest pain or shortness of breath is resolved.   Infiltrate on CT: Noted on CT scan, patient has no cough no white count or fever, started empirically on Rocephin and azithro culture data is negative. Has remained afebrile today and no leukocytosis DC empiric antibiotics.  CLL -Hold ibrutinib -Continue acyclovir prophylaxis    DVT prophylaxis: lovenox Family Communication:husband Disposition Plan/Barrier to D/C: unable to determine Code Status:     Code Status Orders  (From admission, onward)        Start     Ordered   02/13/18 0102  Full code  Continuous     02/13/18 0101    Code Status History    Date Active Date Inactive Code Status Order ID Comments User Context   This patient has a current code status but no historical code status.        IV Access:    Peripheral IV   Procedures and diagnostic studies:   Dg Chest 2 View  Result Date:  02/12/2018 CLINICAL DATA:  Chest tightness with deep inspiration and difficulty breathing. EXAM: CHEST  2 VIEW COMPARISON:  09/14/2017 FINDINGS: Aortic atherosclerosis. Moderate cardiac enlargement. Small bilateral pleural effusions identified. Left midlung and right base scar versus atelectasis. IMPRESSION: 1. Cardiac enlargement and small pleural effusion 2.  Aortic Atherosclerosis (ICD10-I70.0). 3. Left midlung and right base atelectasis versus scar. Electronically Signed   By: Kerby Moors M.D.   On: 02/12/2018 16:46   Ct Angio Chest Pe W And/or Wo Contrast  Result Date: 02/12/2018 CLINICAL DATA:  Cough congestion EXAM: CT ANGIOGRAPHY CHEST WITH CONTRAST TECHNIQUE: Multidetector CT imaging of the chest was performed using the standard protocol during bolus administration of intravenous contrast. Multiplanar CT image reconstructions and MIPs were obtained to evaluate the vascular anatomy. CONTRAST:  100 mL Isovue 370 intravenous COMPARISON:  Radiograph 02/12/2018, PET-CT 09/14/2017, CT thorax 02/19/2017 FINDINGS: Cardiovascular: Satisfactory opacification of the pulmonary arteries to the segmental level. No evidence of pulmonary embolism. Limited evaluation for pulmonary emboli in the bilateral lower lobes due to respiratory motion artifact. Nonaneurysmal aorta. Mild atherosclerotic calcification. Mild cardiomegaly. Development of small moderate pericardial effusion, measuring up to 12 mm in thickness on the left side. Reflux of contrast into the hepatic veins. Mediastinum/Nodes: Midline trachea. No thyroid mass. Scattered nodes in the mediastinum but not increased since comparison PET-CT, right low paratracheal lymph node measures 11 mm compared to 13 mm previously. Esophagus within normal limits. Lungs/Pleura: Small bilateral pleural effusions. Patchy consolidation at the left lower  lobe with streaky densities in the lingula. Negative for a pneumothorax. Upper Abdomen: No acute abnormality. 2.4 cm fluid  collection in the subcutaneous soft tissues of the anterior abdominal wall noted on prior exams. Musculoskeletal: Degenerative changes. No acute or suspicious abnormality Review of the MIP images confirms the above findings. IMPRESSION: 1. Limited evaluation for emboli in the lower lobes due to respiratory motion artifact. No acute embolus is seen. Negative for aortic dissection. 2. Cardiomegaly. Development of small moderate pericardial effusion, measuring up to 12 mm in maximum thickness. Trace bilateral pleural effusions. Partial consolidation in the left lower lobe and patchy density in the lingula which may reflect atelectasis or mild pneumonia. 3. No new or enlarging adenopathy since most recent comparison PET-CT September 2018. Aortic Atherosclerosis (ICD10-I70.0). Electronically Signed   By: Donavan Foil M.D.   On: 02/12/2018 22:45     Medical Consultants:    None.  Anti-Infectives:   IV Rocephin and azithro  Subjective:    Brenda Terrell no complaints she feels great.  Objective:    Vitals:   02/13/18 1613 02/13/18 2028 02/14/18 0551 02/14/18 0843  BP: 126/62 (!) 122/51 (!) 128/57 (!) 118/46  Pulse: 85 88 85 96  Resp: 18 16 18 16   Temp: 98.2 F (36.8 C) 98.5 F (36.9 C) 98.2 F (36.8 C)   TempSrc: Oral Oral Oral   SpO2: 95% 99% 100% 97%  Weight:      Height:       No intake or output data in the 24 hours ending 02/14/18 1009 Filed Weights   02/12/18 1838 02/13/18 0100  Weight: 60.8 kg (134 lb) 60 kg (132 lb 4.4 oz)    Exam: General exam: In no acute distress. Respiratory system: Good air movement and clear to auscultation. Cardiovascular system: S1 & S2 heard, RRR.  Positive hepatojugular reflux Gastrointestinal system: Abdomen is nondistended, soft and nontender.  Central nervous system: Alert and oriented. No focal neurological deficits. Extremities: Trace edema Skin: No rashes, lesions or ulcers   Data Reviewed:    Labs: Basic Metabolic  Panel: Recent Labs  Lab 02/12/18 1659 02/13/18 0129  NA 137 137  K 3.8 3.4*  CL 102 102  CO2 27 24  GLUCOSE 108* 135*  BUN 12 12  CREATININE 0.67 0.69  CALCIUM 9.0 8.7*   GFR Estimated Creatinine Clearance: 47.7 mL/min (by C-G formula based on SCr of 0.69 mg/dL). Liver Function Tests: No results for input(s): AST, ALT, ALKPHOS, BILITOT, PROT, ALBUMIN in the last 168 hours. No results for input(s): LIPASE, AMYLASE in the last 168 hours. No results for input(s): AMMONIA in the last 168 hours. Coagulation profile No results for input(s): INR, PROTIME in the last 168 hours.  CBC: Recent Labs  Lab 02/12/18 1659 02/13/18 0129  WBC 5.9 6.0  NEUTROABS 3.1  --   HGB 14.4 14.8  HCT 42.4 43.7  MCV 91.0 91.6  PLT 142* 133*   Cardiac Enzymes: Recent Labs  Lab 02/12/18 2002 02/13/18 0129 02/13/18 0712 02/13/18 1247  TROPONINI 0.20* 0.17* 0.15* 0.11*   BNP (last 3 results) No results for input(s): PROBNP in the last 8760 hours. CBG: No results for input(s): GLUCAP in the last 168 hours. D-Dimer: Recent Labs    02/12/18 2002  DDIMER 0.53*   Hgb A1c: No results for input(s): HGBA1C in the last 72 hours. Lipid Profile: No results for input(s): CHOL, HDL, LDLCALC, TRIG, CHOLHDL, LDLDIRECT in the last 72 hours. Thyroid function studies: No results for input(s):  TSH, T4TOTAL, T3FREE, THYROIDAB in the last 72 hours.  Invalid input(s): FREET3 Anemia work up: No results for input(s): VITAMINB12, FOLATE, FERRITIN, TIBC, IRON, RETICCTPCT in the last 72 hours. Sepsis Labs: Recent Labs  Lab 02/12/18 1659 02/13/18 0129  WBC 5.9 6.0   Microbiology Recent Results (from the past 240 hour(s))  MRSA PCR Screening     Status: None   Collection Time: 02/13/18  1:10 AM  Result Value Ref Range Status   MRSA by PCR NEGATIVE NEGATIVE Final    Comment:        The GeneXpert MRSA Assay (FDA approved for NASAL specimens only), is one component of a comprehensive MRSA  colonization surveillance program. It is not intended to diagnose MRSA infection nor to guide or monitor treatment for MRSA infections. Performed at [facility], [place] [address], [place], [place] [address]      Medications:   . acyclovir  400 mg Oral Daily  . calcium-vitamin D  1 tablet Oral BID  . enoxaparin (LOVENOX) injection  60 mg Subcutaneous BID  . metoprolol tartrate  25 mg Oral BID  . multivitamin with minerals  1 tablet Oral Daily  . prednisoLONE acetate  1 drop Right Eye BID  . sodium chloride flush  3 mL Intravenous Q12H   Continuous Infusions:     LOS: 2 days   [facility] Pager [phone]  *Please refer to [email or web address], password [email or web address] to get updated schedule on who will round on this patient, as hospitalists switch teams weekly. If 7PM-7AM, please contact night-coverage at [email or web address], password [email or web address] for any overnight needs.  02/14/2018, 10:09 AM

## 2018-02-14 NOTE — Discharge Instructions (Addendum)
°  You have some fluid around your heart, Dr. Johnsie Cancel will recheck in 2 weeks at his office on [address] an Echo This will be done 03/01/18 at 1:00 PM  Call to re-shedule if not a good time for you.        Information on my medicine - ELIQUIS (apixaban)  Why was Eliquis prescribed for you? Eliquis was prescribed for you to reduce the risk of a blood clot forming that can cause a stroke if you have a medical condition called atrial fibrillation (a type of irregular heartbeat).  What do You need to know about Eliquis ? Take your Eliquis TWICE DAILY - one tablet in the morning and one tablet in the evening with or without food. If you have difficulty swallowing the tablet whole please discuss with your pharmacist how to take the medication safely.  Take Eliquis exactly as prescribed by your doctor and DO NOT stop taking Eliquis without talking to the doctor who prescribed the medication.  Stopping may increase your risk of developing a stroke.  Refill your prescription before you run out.  After discharge, you should have regular check-up appointments with your healthcare provider that is prescribing your Eliquis.  In the future your dose may need to be changed if your kidney function or weight changes by a significant amount or as you get older.  What do you do if you miss a dose? If you miss a dose, take it as soon as you remember on the same day and resume taking twice daily.  Do not take more than one dose of ELIQUIS at the same time to make up a missed dose.  Important Safety Information A possible side effect of Eliquis is bleeding. You should call your healthcare provider right away if you experience any of the following: ? Bleeding from an injury or your nose that does not stop. ? Unusual colored urine (red or dark brown) or unusual colored stools (red or black). ? Unusual bruising for unknown reasons. ? A serious fall or if you hit your head (even if there is no  bleeding).  Some medicines may interact with Eliquis and might increase your risk of bleeding or clotting while on Eliquis. To help avoid this, consult your healthcare provider or pharmacist prior to using any new prescription or non-prescription medications, including herbals, vitamins, non-steroidal anti-inflammatory drugs (NSAIDs) and supplements.  This website has more information on Eliquis (apixaban): http://www.eliquis.com/eliquis/home

## 2018-02-15 ENCOUNTER — Other Ambulatory Visit: Payer: Self-pay | Admitting: Cardiology

## 2018-02-15 DIAGNOSIS — R0602 Shortness of breath: Secondary | ICD-10-CM

## 2018-02-15 DIAGNOSIS — C9110 Chronic lymphocytic leukemia of B-cell type not having achieved remission: Secondary | ICD-10-CM

## 2018-02-15 DIAGNOSIS — D61810 Antineoplastic chemotherapy induced pancytopenia: Secondary | ICD-10-CM

## 2018-02-15 DIAGNOSIS — I313 Pericardial effusion (noninflammatory): Secondary | ICD-10-CM

## 2018-02-15 DIAGNOSIS — I3139 Other pericardial effusion (noninflammatory): Secondary | ICD-10-CM

## 2018-02-15 DIAGNOSIS — R748 Abnormal levels of other serum enzymes: Secondary | ICD-10-CM

## 2018-02-15 DIAGNOSIS — D696 Thrombocytopenia, unspecified: Secondary | ICD-10-CM

## 2018-02-15 MED ORDER — APIXABAN 5 MG PO TABS: 5.0000 mg | ORAL_TABLET | Freq: Two times a day (BID) | ORAL | 0 refills | Status: AC

## 2018-02-15 NOTE — Progress Notes (Signed)
Progress Note  Patient Name: Brenda Terrell Date of Encounter: 02/15/2018  Primary Cardiologist: Jenkins Rouge, MD   Subjective   No chest pain and no SOB, her areas on fingers slowly resolving per pt.    Inpatient Medications    Scheduled Meds: . acyclovir  400 mg Oral Daily  . apixaban  5 mg Oral BID  . calcium-vitamin D  1 tablet Oral BID  . metoprolol tartrate  25 mg Oral BID  . multivitamin with minerals  1 tablet Oral Daily  . prednisoLONE acetate  1 drop Right Eye BID  . sodium chloride flush  3 mL Intravenous Q12H   Continuous Infusions:  PRN Meds: acetaminophen **OR** acetaminophen, guaiFENesin-codeine, ondansetron **OR** ondansetron (ZOFRAN) IV, ondansetron, oxyCODONE, polyethylene glycol   Vital Signs    Vitals:   02/14/18 0843 02/14/18 1500 02/14/18 2100 02/15/18 0507  BP: (!) 118/46 (!) 120/55 138/68 138/66  Pulse: 96 80 96 87  Resp: 16 18 16 16   Temp:  98.8 F (37.1 C) 98.6 F (37 C) 98.2 F (36.8 C)  TempSrc:  Oral Oral Oral  SpO2: 97% 100% 100% 100%  Weight:      Height:       No intake or output data in the 24 hours ending 02/15/18 0851 Filed Weights   02/12/18 1838 02/13/18 0100  Weight: 134 lb (60.8 kg) 132 lb 4.4 oz (60 kg)    Telemetry    SR for > 24 hours - Personally Reviewed  ECG    No new - Personally Reviewed  Physical Exam   GEN: No acute distress.   Neck: No JVD Cardiac: RRR, no murmurs, rubs, or gallops.  Respiratory: Clear to auscultation bilaterally. GI: Soft, nontender, non-distended  MS: No edema; No deformity. Rt hand finger tips with small areas of discoloration, emboli? Neuro:  Nonfocal  Psych: Normal affect   Labs    Chemistry Recent Labs  Lab 02/12/18 1659 02/13/18 0129  NA 137 137  K 3.8 3.4*  CL 102 102  CO2 27 24  GLUCOSE 108* 135*  BUN 12 12  CREATININE 0.67 0.69  CALCIUM 9.0 8.7*  GFRNONAA >60 >60  GFRAA >60 >60  ANIONGAP 8 11     Hematology Recent Labs  Lab 02/12/18 1659  02/13/18 0129  WBC 5.9 6.0  RBC 4.66 4.77  HGB 14.4 14.8  HCT 42.4 43.7  MCV 91.0 91.6  MCH 30.9 31.0  MCHC 34.0 33.9  RDW 14.8 14.8  PLT 142* 133*    Cardiac Enzymes Recent Labs  Lab 02/12/18 2002 02/13/18 0129 02/13/18 0712 02/13/18 1247  TROPONINI 0.20* 0.17* 0.15* 0.11*    Recent Labs  Lab 02/12/18 2012  TROPIPOC 0.27*     BNP Recent Labs  Lab 02/13/18 0129  BNP 412.5*     DDimer  Recent Labs  Lab 02/12/18 2002  DDIMER 0.53*     Radiology    No results found.  Cardiac Studies   ECHO: 02/13/2018 - Left ventricle: The cavity size was normal. There was mild concentric hypertrophy. Systolic function was normal. The estimated ejection fraction was in the range of 50% to 55%. Wall motion was normal; there were no regional wall motion abnormalities. Doppler parameters are consistent with abnormal left ventricular relaxation (grade 1 diastolic dysfunction). - Aortic valve: There was mild regurgitation. - Mitral valve: There was mild regurgitation. - Right ventricle: The cavity size was normal. Wall thickness was normal. Systolic function was normal. - Right atrium: The atrium  was normal in size. - Tricuspid valve: There was mild regurgitation. - Pulmonary arteries: Systolic pressure was mildly increased. PA peak pressure: 38 mm Hg (S). - Inferior vena cava: The vessel was normal in size. The respirophasic diameter changes were in the normal range (>= 50%), consistent with normal central venous pressure. - Pericardium, extracardiac: There was no pericardial effusion. Impressions: - When compared to the rior study from 12/16/2017 there is now moderate circumferential pericardial effusion with maximum diameter 11 mm, previously trivial. There are no signs of tamponade.     Patient Profile     77 y.o. female with a hx of CLL stage I onibrutinib, GERD, OA, asthma, goiter, migraines and palpitations, supposed MVP, now  admitted with a flutter and SOB and chest pain.     Assessment & Plan    PAF--related to ibrutinib CHA2Vasc 3 And HASBLED on ly 1 suggests the anticoagulation indicated. Started Eliquis 5 bid --continue BB,  (cardizem, verapamil and amiodarone can increase levels.    Per Dr. Johnsie Cancel "Of ibrutinib Wouild give one/ two doses of prednisone and discuss with Dr Alvy Bimler continuing medicine at lower dose If she has recurrent PAF will need to start Tikosyn or Stop the medicine all together although it does tend to help her adenopathy. Risk of NOAC should not be that high so long as PLT count stays above 100.  She is only on day 6 of her current cycle so should discuss with oncology what to do with prednisone and dosing"  Oncology stopped the ibrutinib.  The prednisone pt is on currently is eye drops.   Elevated Troponin-- possible demand ischemia from the tachycardia.  No WMA on echo.  0.27 for poc, otherwise 0.20 to 0.11.    Small/moderate pericardial effusion on CTA and on Echo "When compared to the rior study from 12/16/2017 there is now   moderate circumferential pericardial effusion with maximum diameter 11 mm, previously trivial. There are no signs of tamponade."  Will repeat echo in 2 weeks.        For questions or updates, please contact [place] Please consult [email or web address] for contact info under Cardiology/STEMI.      Signed, Cecilie Kicks, NP  02/15/2018, 8:51 AM    Patient examined chart reviewed. Lungs clear no murmur no cervical adenopathy punctate lesions in finger Tips no change Started on eliquis for PAF. Will recheck echo in 2 weeks since on [facility] now. No  ibrutinib For now and if used in future will need to be lower dose  Ok to d/c home  [employer]

## 2018-02-15 NOTE — Care Management Important Message (Signed)
Important Message  Patient Details  Name: Brenda Terrell MRN: [id number] Date of Birth: [birth year]   Medicare Important Message Given:  Yes    Kerin Salen 02/15/2018, 12:52 [place] Message  Patient Details  Name: Brenda Terrell MRN: [id number] Date of Birth: 27-Apr-1941   Medicare Important Message Given:  Yes    Kerin Salen 02/15/2018, 12:52 PM

## 2018-02-15 NOTE — Care Management Note (Signed)
Case Management Note  Patient Details  Name: Brenda Terrell MRN: [id number] Date of Birth: 09-28-1941  Subjective/Objective:  Benefit check for eliquis-co pay $22.40-patient informed, & she voiced understanding. Patient has already been provided an eliquis 30day free discount print out from pharmacy. No further CM needs.                  Action/Plan:d/c home.   Expected Discharge Date:  02/15/18               Expected Discharge Plan:  Home/Self Care  In-House Referral:     Discharge planning Services  CM Consult  Post Acute Care Choice:    Choice offered to:     DME Arranged:    DME Agency:     HH Arranged:    HH Agency:     Status of Service:  Completed, signed off  If discussed at [employer] of Stay Meetings, dates discussed:    Additional Comments:  Dessa Phi, RN 02/15/2018, 12:52 PM

## 2018-02-15 NOTE — Progress Notes (Signed)
Patient given discharge, follow up, and medication instructions including side effects of Eliquis and what to report to MD, verbalized understanding, IV x 2 and telemetry removed, personal belongings with patient, family to transport home

## 2018-02-15 NOTE — Discharge Summary (Signed)
Discharge Summary  Brenda Terrell YNW:[id number] DOB: 04/14/1941  PCP: Kelton Pillar, MD  Admit date: 02/12/2018 Discharge date: 02/15/2018  Time spent: <42mins  Recommendations for Outpatient Follow-up:  1. F/u with PMD within a week  for hospital discharge follow up, repeat cbc/bmp at follow up 2. F/u with cardiology in two weeks, repeat echocardiogram 3. F/u with oncology Dr Alvy Bimler  Discharge Diagnoses:  Active Hospital Problems   Diagnosis Date Noted  . Elevated troponin 02/12/2018  . Atrial flutter ([place] [facility]) 02/12/2018  . 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    Discharge Condition: stable  Diet recommendation: heart healthy  Filed Weights   02/12/18 1838 02/13/18 0100  Weight: 60.8 kg (134 lb) 60 kg (132 lb 4.4 oz)    History of present illness: (per admitting MD Dr Herbert Moors) PCP: Kelton Pillar, MD   Patient coming from: Home    Chief Complaint: Shortness of breath  HPI: Brenda Terrell is a 77 y.o. 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  Patient reports that she was doing well until 2 days ago when she began to have pain in her bilateral neck.  This pain was sharp and radiated down to her chest which was dull.  She also had one episode of nonbilious nonbloody emesis.  She also noted intermittent significant palpitations since that time.  She began to have progressive shortness of breath both at rest and on exertion.  She also endorsed significant orthopnea but denies any paroxysmal nocturnal dyspnea.  She has chronic bilateral lower extremity swelling.  She also reported a dry cough and some nasal problems but no congestion, rhinorrhea, diarrhea, abdominal pain, rash. Of notePatient was put on metoprolol by her oncologist as she had episodes of palpitations on a bruit neb.   She did have an echo on 12/16/2017 which was fairly unremarkable.  ED Course: In the ED patient was noted to have atrial flutter with a rate of up to 120.  Patient was placed on diltiazem drip.  Labs are notable for platelets of 142.  Troponin was elevated to 0.  2.  D-dimer was elevated.  CT showed partial consolidation of the left lower lobe and patchy density along the lingula which could represent atelectasis versus pneumonia.  Also noted was a small to moderate pericardial effusion.  Patient was admitted for atrial flutter, pneumonia, elevated troponin.  Cardiology was consulted and felt that possibly pneumonia had flipped patient into a flutter.  Patient was given IV ceftriaxone and azithromycin.     Hospital Course:  Principal Problem:   Elevated troponin Active Problems:   CLL (chronic lymphocytic leukemia) (HCC)   Atrial flutter (HCC)  Atrial flutter/atrial fibrillation with RVR/Elevated troponin/pericardial effusion: -Unclear how long his she has been o A. fib /flutter. - cardiac markers likely due to demand ischemia now trending down she denies any chest pain or shortness of breath is resolved.  -2D echo done that showed any preserved EF with grade 1 diastolic heart failure. CTA no PE. - Ibrutinib discontinued -cardiology consulted and recommended to start on Eliquis CHA2Vasc 3. and  continue beta-blockers.  She has converted to sinus rhythm, she is cleared to discharge home by cardiology. She is to follow up with cardiology in two weeks, repeat echo.    Infiltrate on CT: -Noted on CT scan, patient has no cough,  no white count or fever. - empirical abx Rocephin and azithro started on admission, discontinued on 2/19, culture data is negative. -Has remained afebrile today and no leukocytosis  -she is to follow up with oncology and repeat pet scan.  CLL -ibrutinib discontinued,  -Continue acyclovir prophylaxis --she is to follow up with oncology and repeat pet  scan.    Procedures:  none  Consultations:  Cardiology  oncology   Discharge Exam: BP 138/66 (BP Location: Right Arm)   Pulse 87   Temp 98.2 F (36.8 C) (Oral)   Resp 16   Ht 5' (1.524 m)   Wt 60 kg (132 lb 4.4 oz)   SpO2 100%   BMI 25.83 kg/m   General: NAD Cardiovascular: RRR Respiratory: CTABL Extremity: no edema   Discharge Instructions You were cared for by a hospitalist during your hospital stay. If you have any questions about your discharge medications or the care you received while you were in the hospital after you are discharged, you can call the unit and asked to speak with the hospitalist on call if the hospitalist that took care of you is not available. Once you are discharged, your primary care physician will handle any further medical issues. Please note that NO REFILLS for any discharge medications will be authorized once you are discharged, as it is imperative that you return to your primary care physician (or establish a relationship with a primary care physician if you do not have one) for your aftercare needs so that they can reassess your need for medications and monitor your lab values.   Allergies as of 02/15/2018      Reactions   Gluten Meal Other (See Comments)   Gi upset/bloating   Influenza Virus Vacc Split Pf    DOES NOT TAKE DUE TO ALLERGIC REACTIONS IN THE PAST- pt states she got very sick from this & her oncologist recommended she no longer get it   Nutrasweet Aspartame [aspartame]    Salicylates Cross Reactors    Sulfa Antibiotics Swelling   Swelling and rash      Medication List    STOP taking these medications   Ibrutinib 280 MG Tabs     TAKE these medications   acyclovir 400 MG tablet Commonly known as:  ZOVIRAX Take 1 tablet (400 mg total) by mouth daily.   apixaban 5 MG Tabs tablet Commonly known as:  ELIQUIS Take 1 tablet (5 mg total) by mouth 2 (two) times daily.   CALCIUM 600+D 600-200 MG-UNIT Tabs Generic  drug:  Calcium Carbonate-Vitamin D Take 1 tablet by mouth 2 (two) times daily.   guaiFENesin-codeine 100-10 MG/5ML syrup Take 5 mLs by mouth 3 (three) times daily as needed for cough.   metoprolol tartrate 25 MG tablet Commonly known as:  LOPRESSOR Take 1 tablet (25 mg total) 2 (two) times daily by mouth.   multivitamin with minerals Tabs tablet Take 1 tablet by mouth daily.   ondansetron 4 MG disintegrating tablet Commonly known as:  ZOFRAN ODT 4mg  ODT q6 hours prn nausea/vomit   prednisoLONE acetate 1 % ophthalmic suspension Commonly known as:  PRED FORTE Place 1 drop into the right eye 2 (two) times daily.      Allergies  Allergen Reactions  . Gluten Meal Other (See Comments)    Gi upset/bloating  . Influenza Virus Vacc Split Pf     DOES NOT TAKE DUE TO ALLERGIC REACTIONS IN THE PAST- pt states she got very sick from this & her  oncologist recommended she no longer get it  . Nutrasweet Aspartame [Aspartame]   . Salicylates Cross Reactors   . Sulfa Antibiotics Swelling    Swelling and rash   Follow-up Information    Josue Hector, MD Follow up on 03/08/2018.   Specialty:  Cardiology Why:  with his Nurse Practitioner Cecilie Kicks at 3:00 pm Contact information: [address] [address] [phone]        [facility] Follow up on 03/01/2018.   Specialty:  Cardiology Why:  at 1:00PM for echocardiogram.   Contact information: [address], [address] [place]       Kelton Pillar, MD Follow up.   Specialty:  Family Medicine Why:  hospital discharge follow up Contact information: [address] [place] [address] [phone]            The results of significant diagnostics from this hospitalization (including imaging, microbiology, ancillary and laboratory) are listed below for reference.    Significant Diagnostic Studies: Dg Chest 2  View  Result Date: 02/12/2018 CLINICAL DATA:  Chest tightness with deep inspiration and difficulty breathing. EXAM: CHEST  2 VIEW COMPARISON:  09/14/2017 FINDINGS: Aortic atherosclerosis. Moderate cardiac enlargement. Small bilateral pleural effusions identified. Left midlung and right base scar versus atelectasis. IMPRESSION: 1. Cardiac enlargement and small pleural effusion 2.  Aortic Atherosclerosis (ICD10-I70.0). 3. Left midlung and right base atelectasis versus scar. Electronically Signed   By: Kerby Moors M.D.   On: 02/12/2018 16:46   Ct Angio Chest Pe W And/or Wo Contrast  Result Date: 02/12/2018 CLINICAL DATA:  Cough congestion EXAM: CT ANGIOGRAPHY CHEST WITH CONTRAST TECHNIQUE: Multidetector CT imaging of the chest was performed using the standard protocol during bolus administration of intravenous contrast. Multiplanar CT image reconstructions and MIPs were obtained to evaluate the vascular anatomy. CONTRAST:  100 mL Isovue 370 intravenous COMPARISON:  Radiograph 02/12/2018, PET-CT 09/14/2017, CT thorax 02/19/2017 FINDINGS: Cardiovascular: Satisfactory opacification of the pulmonary arteries to the segmental level. No evidence of pulmonary embolism. Limited evaluation for pulmonary emboli in the bilateral lower lobes due to respiratory motion artifact. Nonaneurysmal aorta. Mild atherosclerotic calcification. Mild cardiomegaly. Development of small moderate pericardial effusion, measuring up to 12 mm in thickness on the left side. Reflux of contrast into the hepatic veins. Mediastinum/Nodes: Midline trachea. No thyroid mass. Scattered nodes in the mediastinum but not increased since comparison PET-CT, right low paratracheal lymph node measures 11 mm compared to 13 mm previously. Esophagus within normal limits. Lungs/Pleura: Small bilateral pleural effusions. Patchy consolidation at the left lower lobe with streaky densities in the lingula. Negative for a pneumothorax. Upper Abdomen: No acute  abnormality. 2.4 cm fluid collection in the subcutaneous soft tissues of the anterior abdominal wall noted on prior exams. Musculoskeletal: Degenerative changes. No acute or suspicious abnormality Review of the MIP images confirms the above findings. IMPRESSION: 1. Limited evaluation for emboli in the lower lobes due to respiratory motion artifact. No acute embolus is seen. Negative for aortic dissection. 2. Cardiomegaly. Development of small moderate pericardial effusion, measuring up to 12 mm in maximum thickness. Trace bilateral pleural effusions. Partial consolidation in the left lower lobe and patchy density in the lingula which may reflect atelectasis or mild pneumonia. 3. No new or enlarging adenopathy since most recent comparison PET-CT September 2018. Aortic Atherosclerosis (ICD10-I70.0). Electronically Signed   By: Donavan Foil M.D.   On: 02/12/2018 22:45    Microbiology:  Recent Results (from the past 240 hour(s))  MRSA PCR Screening     Status: None   Collection Time: 02/13/18  1:10 AM  Result Value Ref Range Status   MRSA by PCR NEGATIVE NEGATIVE Final    Comment:        The GeneXpert MRSA Assay (FDA approved for NASAL specimens only), is one component of a comprehensive MRSA colonization surveillance program. It is not intended to diagnose MRSA infection nor to guide or monitor treatment for MRSA infections. Performed at [facility], [place] [address] [place],  [address]      Labs: Basic Metabolic Panel: Recent Labs  Lab 02/12/18 1659 02/13/18 0129  NA 137 137  K 3.8 3.4*  CL 102 102  CO2 27 24  GLUCOSE 108* 135*  BUN 12 12  CREATININE 0.67 0.69  CALCIUM 9.0 8.7*   Liver Function Tests: No results for input(s): AST, ALT, ALKPHOS, BILITOT, PROT, ALBUMIN in the last 168 hours. No results for input(s): LIPASE, AMYLASE in the last 168 hours. No results for input(s): AMMONIA in the last 168 hours. CBC: Recent Labs  Lab 02/12/18 1659  02/13/18 0129  WBC 5.9 6.0  NEUTROABS 3.1  --   HGB 14.4 14.8  HCT 42.4 43.7  MCV 91.0 91.6  PLT 142* 133*   Cardiac Enzymes: Recent Labs  Lab 02/12/18 2002 02/13/18 0129 02/13/18 0712 02/13/18 1247  TROPONINI 0.20* 0.17* 0.15* 0.11*   BNP: BNP (last 3 results) Recent Labs    02/13/18 0129  BNP 412.5*    ProBNP (last 3 results) No results for input(s): PROBNP in the last 8760 hours.  CBG: No results for input(s): GLUCAP in the last 168 hours.     Signed:  Florencia Reasons MD, PhD  [facility] 02/15/2018, 12:34 PM

## 2018-02-15 NOTE — Progress Notes (Signed)
Brenda Terrell   DOB:1941-11-02   DT#:[id number]    The patient is well-known to me.  I was consulted by hospitalist yesterday to comment on the use of ibrutinib in this patient who was admitted with atrial fibrillation.  Summary of oncologic history   CLL (chronic lymphocytic leukemia) ([place])   11/29/2001 Pathology Results    [phone] Left neck biopsy: LEFT NECK LYMPH NODE, EXCISION: SMALL LYMPHOCYTIC LYMPHOMA/CHRONIC LYMPHOCYTIC LEUKEMIA, SEE COMMENT  COMMENT Sections show effacement of the lymph node architecture by uniform small lymphoid cells displaying chromatin clumping and small to inconspicuous nucleoli. This is associated with numerous proliferation centers (pseudofollicular centers). There is no evidence of high grade transformation. Flow cytometric analysis showed a monoclonal, lambda restricted B cell population expressing pan B cell antigens including CD20 and CD23 with coexpression of CD5 and CD20. 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      01/04/2002 Bone Marrow Biopsy    BONE MARROW, TOUCH IMPRINTS AND CORE BIOPSY: INVOLVEMENT BY SMALL LYMPHOCYTIC LYMPHOMA/CHRONIC LYMPHOCYTIC LEUKEMIA      01/06/2004 Miscellaneous    She was treated for the CLL with Rituxan in 2005/ 2006 and again Jan 2008 thru Feb 2009,with good response, butheld then due to complaints of transient short term memory loss after each RItuxan treatment      10/31/2016 PET scan    Diffuse cervical, axillary, retroperitoneal, and pelvic adenopathy is noted which exhibits very mild increased FDG uptake. I suspect the mild level of increased FDG uptake reflects the inherent hypometabolic state of the tumor as opposed to lack of viable tumor.  2. Brown fat within bilateral supraclavicular and paraspinal fat.      11/12/2016 Imaging    Ct scan showed significant progression of bulky confluent retroperitoneal and bilateral pelvic lymphadenopathy. Mild  progression of thoracic lymphadenopathy involving the bilateral axillary, mediastinal and bilateral hilar chains. 2. New extensive fine perilymphatic distribution nodularity and interlobular septal thickening throughout both lungs, most consistent with lymphangitic tumor, as can be seen in lymphoproliferative disorders. 3. Normal size spleen. 4. Additional findings include aortic atherosclerosis, stable 2.0 cm inferior thyroid isthmus nodule mild sigmoid diverticulosis, mild superior L1 vertebral compression fracture of indeterminate chronicity (new since 03/06/2013), and increased size of moderate midline high ventral abdominal wall hernia containing fat and fluid.      12/31/2016 Pathology Results    Lymph node for lymphoma, left supraclavicular - SMALL LYMPHOCYTIC LYMPHOMA. - SEE ONCOLOGY TABLE. Microscopic Comment LYMPHOMA Histologic type: Non-Hodgkin lymphoma, small lymphocytic type. Grade (if applicable): Low grade. Flow cytometry: Monoclonal, lambda restricted B-cell population expressing pan B-cell antigens including CD20 associated with dim CD5 expression. No CD10 expression is identified ([id number]). Immunohistochemical stains: CD10, CD20, CD3, CD43, CD5, CD79a, and cyclin D1 with appropriate controls. Touch preps/imprints: Predominance of small lymphoid cells with high nuclear cytoplasmic ratio, coarse chromatin and small to inconspicuous nucleoli admixed with larger lymphoid cells to a much lesser extent. Comments: The sections show lymph nodal tissue displaying effacement of the architecture by a lymphoproliferative process characterized by predominance of small lymphoid cells with high nuclear cytoplasmic ratio, round to slightly irregular nuclei, coarse chromatin and small to inconspicuous nucleoli. This is associated with abundant variably sized proliferation centers (pseudofollicular centers) imparting a vaguely nodular pattern. Flow cytometric analysis shows a monoclonal, lambda  restricted B-cell population expressing pan B-cell antigens associated with dim CD5 expression. In addition, immunohistochemical stains were performed and show that the lymphoid cells are predominantly composed of B cells as seen  with CD20 and CD79a associated with co-expression with CD43 and weak co-expression with CD5. No significant CD10 or cyclin D1 positivity is identified. There is admixed T-cell population to a lesser extent as primarily seen with CD3. The findings are consistent with involvement by small lymphocytic lymphoma/chronic lymphocytic leukemia. Clinical correlation is recommended. (BNS:ecj 01/04/2017)      12/31/2016 Surgery    She had excisional biopsy left supraclavicular lymph nodes      02/14/2017 - 05/03/2017 Chemotherapy    The patient had treatment with Gazyva. She has delay in starting treatment due to inability to get insurance approval for ibrutinib.      02/21/2017 Adverse Reaction    Treatment was delayed by 1 week due to pancytopenia      05/30/2017 PET scan    The index left level 5 cervical lymph node measuring 2 cm on today's study previously measured 1.3 cm. The index right level 4 cervical node measures 1.6 cm is on today's study, previously 1.4 cm. The index right axillary node measures 1.3 cm on today's study. Previously 1.1 cm. The index right paratracheal node measures 1.4 cm on the current study. Previously 1.1 cm. Within the abdomen the large retroperitoneal nodal mass measures 9.2 by 4.6 cm. This is compared with 8.7 x 4.8 cm previously. Within the right external iliac lymph node chain there is a 1.3 cm lymph node on today's study. Previously this measured 1 cm. Left external iliac lymph node measures 1.2 cm on today's study. Previously 1 cm.  IMPRESSION: 1. When compared with 02/19/2017 the index lymph nodes within the neck, chest, abdomen and pelvis demonstrate mild increase in size in the interval.      06/17/2017 -  Chemotherapy    She started taking  Ibrutinib      09/14/2017 PET scan    1. Reduction in size of the adenopathy in the neck, chest, and abdomen/pelvis. Generalized slight reduction in the SUV, currently Deauville 2 and Deauville 3. 2. Other imaging findings of potential clinical significance: Mild cardiomegaly. Sub xiphoid hernia containing adipose tissue and a small amount of fluid similar to prior. Aortic Atherosclerosis (ICD10-I70.0).      She was admitted with profound shortness of breath.  CT angiogram show no evidence of pulmonary emboli.  Echocardiogram showed preserved ejection fraction with circumferential pericardial effusion without tamponade physiology.  Her atrial fibrillation read converted back to normal sinus rhythm.  She is currently anticoagulated with apixaban Subjective: She feels better today.  She denies significant shortness of breath or pleuritic chest discomfort.  Denies palpitation.  Assessment & Plan:  CLL Her recent CT imaging showed no evidence of disease progression. I recommend discontinuation of ibrutinib. She does not need to be on prednisone. I think it is reasonable to continue on prophylactic antimicrobial therapy with acyclovir. My plan would be to repeat PET/CT scan as an outpatient prior to her appointment next month for further staging If PET/CT scan show no residual disease, we can discontinue treatment The patient is reassured that she has no current active disease based on CT imaging and blood work.  Paroxysmal atrial fibrillation I would defer to cardiologist for further management.  Thankfully, echocardiogram showed no evidence of congestive heart failure.  Agree with anticoagulation therapy  Pericardial effusion Ibrutinib would not cause pericardial effusion.  The cause of this is unknown.  Again, I would defer to cardiologist for further management of this.  Mild, chronic thrombocytopenia Could be related to treatment side effects.  She is not  symptomatic. There is no  contraindication to remain on antiplatelet agents or anticoagulants as long as the platelet is greater than 50,000.  Discharge planning She has appointment to see me next month. I will sign off.  Please call if questions arise  Objective:  Vitals:   02/14/18 2100 02/15/18 0507  BP: 138/68 138/66  Pulse: 96 87  Resp: 16 16  Temp: 98.6 F (37 C) 98.2 F (36.8 C)  SpO2: 100% 100%    No intake or output data in the 24 hours ending 02/15/18 0750  GENERAL:alert, no distress and comfortable SKIN: skin color, texture, turgor are normal, no rashes or significant lesions EYES: normal, Conjunctiva are pink and non-injected, sclera clear OROPHARYNX:no exudate, no erythema and lips, buccal mucosa, and tongue normal  NECK: supple, thyroid normal size, non-tender, without nodularity LYMPH:  no palpable lymphadenopathy in the cervical, axillary or inguinal LUNGS: clear to auscultation and percussion with normal breathing effort HEART: regular rate & rhythm and no murmurs and no lower extremity edema ABDOMEN:abdomen soft, non-tender and normal bowel sounds Musculoskeletal:no cyanosis of digits and no clubbing  NEURO: alert & oriented x 3 with fluent speech, no focal motor/sensory deficits   Labs:  Lab Results  Component Value Date   WBC 6.0 02/13/2018   HGB 14.8 02/13/2018   HCT 43.7 02/13/2018   MCV 91.6 02/13/2018   PLT 133 (L) 02/13/2018   NEUTROABS 3.1 02/12/2018    Lab Results  Component Value Date   NA 137 02/13/2018   K 3.4 (L) 02/13/2018   CL 102 02/13/2018   CO2 24 02/13/2018    Studies: I have personally review CT scan and echocardiogram report   Heath Lark, MD 02/15/2018  7:50 AM

## 2018-02-17 ENCOUNTER — Telehealth: Payer: Self-pay | Admitting: *Deleted

## 2018-02-17 ENCOUNTER — Telehealth: Payer: Self-pay | Admitting: Cardiovascular Disease

## 2018-02-17 NOTE — Telephone Encounter (Signed)
Pt left message stating she has been feeling very sick, no appetite. Wants to know if Dr Alvy Bimler thinks she should go back on steroids.   Dr Alvy Bimler wants her to take steroids 20 mg daily X 1 week.  Pt states she has Prednisone 10 mg tablets at home. Has ~ 20 tablets left. Will take 2 tablets every morning X 1 week and will call us next Friday to let us know how she is doing. Will call sooner if feeling worse.

## 2018-02-17 NOTE — Telephone Encounter (Signed)
error 

## 2018-02-18 ENCOUNTER — Telehealth: Payer: Self-pay | Admitting: Physician Assistant

## 2018-02-18 NOTE — Telephone Encounter (Signed)
Paged by answring service. The patient having upset stomach since discharge. Called PCP yesterday who discontinued Eliquis. Today she started to having nausea and vomiting. She denies chest pain. She does has SOB and LE swelling. Fatigue and tried. Advised to go ER. The patient will agree and come to [facility] or call EMS. Appreciated for call.

## 2018-02-21 ENCOUNTER — Telehealth: Payer: Self-pay | Admitting: *Deleted

## 2018-02-21 NOTE — Telephone Encounter (Signed)
-----   Message from Heath Lark, MD sent at 02/21/2018  7:30 AM EST ----- Regarding: how is she? Can you call and ask how she is doing?

## 2018-02-21 NOTE — Telephone Encounter (Signed)
Called patient to check on her. Daughter states she passed on Apr 15, 2023 at home from cardiac arrest. RN expressed condolences.

## 2018-02-24 DIAGNOSIS — 419620001 Death: Secondary | SNOMED CT | POA: Diagnosis not present

## 2018-02-24 DEATH — deceased

## 2018-03-01 ENCOUNTER — Other Ambulatory Visit (HOSPITAL_COMMUNITY): Payer: Medicare Other

## 2018-03-08 ENCOUNTER — Ambulatory Visit: Payer: Medicare Other | Admitting: Cardiology

## 2018-03-13 ENCOUNTER — Ambulatory Visit: Payer: Medicare Other | Admitting: Hematology and Oncology

## 2018-03-13 ENCOUNTER — Other Ambulatory Visit (HOSPITAL_COMMUNITY): Payer: Medicare Other

## 2018-03-13 ENCOUNTER — Other Ambulatory Visit: Payer: Medicare Other

## 2018-03-14 ENCOUNTER — Other Ambulatory Visit: Payer: Self-pay | Admitting: Hematology and Oncology

## 2018-03-14 DIAGNOSIS — I499 Cardiac arrhythmia, unspecified: Secondary | ICD-10-CM

## 2018-03-14 DIAGNOSIS — Z23 Encounter for immunization: Secondary | ICD-10-CM

## 2018-03-22 ENCOUNTER — Other Ambulatory Visit: Payer: Self-pay | Admitting: Hematology and Oncology

## 2018-03-22 ENCOUNTER — Other Ambulatory Visit: Payer: Self-pay

## 2018-03-22 DIAGNOSIS — I499 Cardiac arrhythmia, unspecified: Secondary | ICD-10-CM

## 2018-03-22 DIAGNOSIS — Z23 Encounter for immunization: Secondary | ICD-10-CM

## 2018-03-22 MED ORDER — METOPROLOL TARTRATE 25 MG PO TABS: 25.0000 mg | ORAL_TABLET | Freq: Two times a day (BID) | ORAL | 1 refills | Status: AC

## 2019-03-28 IMAGING — PT NM PET TUM IMG RESTAG (PS) SKULL BASE T - THIGH
1 of 8 series · 1 of 25 positions shown · non-contrast
Comparison: Multiple exams, including 05/30/2017 PET-CT

CLINICAL DATA: Subsequent treatment strategy for chronic
lymphocytic leukemia.

EXAM:
NUCLEAR MEDICINE PET SKULL BASE TO THIGH
TECHNIQUE: 6.4 mCi F-18 FDG was injected intravenously. Full-ring PET imaging
was performed from the skull base to thigh after the radiotracer. CT
data was obtained and used for attenuation correction and anatomic
localization.
FASTING BLOOD GLUCOSE:  Value: 97 mg/dl

[Series 4: ct sk_thigh 5.0 b31f · axial · 5.0mm · 0.98mm/px · 1 of 190 slices shown]
[im 190/190  brain]
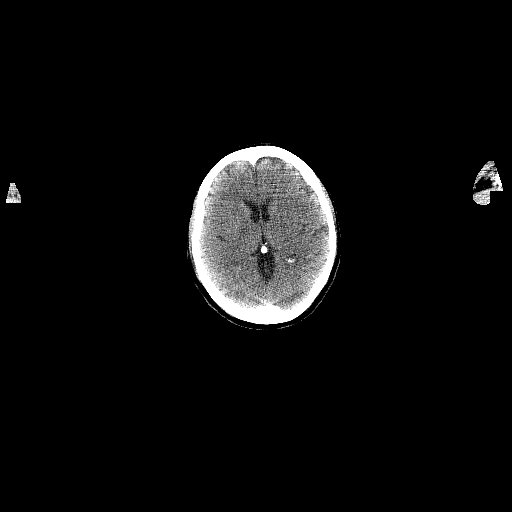

[1 of 25 positions shown; findings below may reference images not displayed]

FINDINGS: NECK

Index level V lymph node conglomerate on image 39/4 measures 1.1 cm
in short axis (formerly measured at 2.0 cm) with maximum SUV
(formerly the same.). The index right level IV lymph node shown on
the prior exam is no longer seen is a structure separate from the
right internal jugular vein.

CHEST

Index left axillary lymph node measures 0.6 cm in short axis on
image 51/4 (formerly 1.1 cm) with maximum standard uptake value the
1.4 (formerly 1.9).

The the index right axillary lymph node measures 0.8 cm in short
axis on image 51/4 (formerly 1.3 cm) with maximum SUV 1.6 (formerly
1.9). The right lower paratracheal lymph node measures 1.3 cm in
short axis on image 58/4 (formerly 1.4 cm) with maximum SUV
(formerly the same).

Atherosclerotic calcification of the aortic arch. Mild biapical
pleuroparenchymal scarring. Some mild interstitial accentuation in
both lungs. Mild cardiomegaly. No definite worrisome pulmonary
nodules.

Background mediastinal blood pool activity level 2.1.

ABDOMEN/PELVIS

Index conglomerate nodal mass in the retroperitoneum potentially
extending into the adjacent mesentery as a conglomerate size of
by 3.6 cm on image 122/4 (formerly 9.2 by 4.6 cm) and a maximum SUV
of 2.5 (formerly 3.0).

The index left external iliac node measures 1.0 cm in short axis on
image 147/4 (formerly 1.2 cm) with maximum SUV 1.9 (formerly 2.0).

The index right external iliac node measures 0.6 cm in short axis on
image 142/4 (formerly 1.3 cm) with maximum standard uptake value
(formerly 2.1).

No significant abnormal focal activity is observed in the liver,
spleen, or pancreas. No splenomegaly. Physiologic bowel activity
noted.

Sub xiphoid hernia containing adipose tissue and a small amount of
fluid, similar to prior. Aortoiliac atherosclerotic vascular
disease.

Background hepatic activity level SUV 2.7.

SKELETON

No focal hypermetabolic activity to suggest skeletal metastasis.
IMPRESSION: 1. Reduction in size of the adenopathy in the neck, chest, and
abdomen/pelvis. Generalized slight reduction in the SUV, currently
[HOSPITAL] 2 and [HOSPITAL] 3.
2. Other imaging findings of potential clinical significance: Mild
cardiomegaly. Sub xiphoid hernia containing adipose tissue and a
small amount of fluid similar to prior. Aortic Atherosclerosis
([id number]).
